# Patient Record
Sex: Male | Born: 1935
Health system: Southern US, Community
[De-identification: ages and names within clinical notes are randomized; demographics above are authoritative.]

## PROBLEM LIST (undated history)

## (undated) DIAGNOSIS — K219 Gastro-esophageal reflux disease without esophagitis: Secondary | ICD-10-CM

## (undated) DIAGNOSIS — M869 Osteomyelitis, unspecified: Secondary | ICD-10-CM

## (undated) DIAGNOSIS — I10 Essential (primary) hypertension: Secondary | ICD-10-CM

## (undated) DIAGNOSIS — N289 Disorder of kidney and ureter, unspecified: Secondary | ICD-10-CM

## (undated) HISTORY — PX: LEG AMPUTATION BELOW KNEE: SHX694

---

## 2004-01-20 ENCOUNTER — Other Ambulatory Visit: Admission: RE | Admit: 2004-01-20 | Discharge: 2004-01-20 | Payer: Self-pay | Admitting: Internal Medicine

## 2005-06-11 ENCOUNTER — Ambulatory Visit: Payer: Self-pay | Admitting: Physical Medicine & Rehabilitation

## 2005-06-11 ENCOUNTER — Inpatient Hospital Stay (HOSPITAL_COMMUNITY): Admission: EM | Admit: 2005-06-11 | Discharge: 2005-06-21 | Payer: Self-pay | Admitting: Emergency Medicine

## 2005-06-16 ENCOUNTER — Ambulatory Visit: Payer: Self-pay | Admitting: Plastic Surgery

## 2005-06-17 ENCOUNTER — Encounter (INDEPENDENT_AMBULATORY_CARE_PROVIDER_SITE_OTHER): Payer: Self-pay | Admitting: Specialist

## 2005-06-21 ENCOUNTER — Inpatient Hospital Stay (HOSPITAL_COMMUNITY)
Admission: RE | Admit: 2005-06-21 | Discharge: 2005-07-06 | Payer: Self-pay | Admitting: Physical Medicine & Rehabilitation

## 2005-08-10 ENCOUNTER — Ambulatory Visit: Payer: Self-pay | Admitting: Physical Medicine & Rehabilitation

## 2005-08-10 ENCOUNTER — Encounter
Admission: RE | Admit: 2005-08-10 | Discharge: 2005-11-08 | Payer: Self-pay | Admitting: Physical Medicine & Rehabilitation

## 2005-08-11 ENCOUNTER — Encounter
Admission: RE | Admit: 2005-08-11 | Discharge: 2005-11-09 | Payer: Self-pay | Admitting: Physical Medicine & Rehabilitation

## 2005-11-10 ENCOUNTER — Encounter
Admission: RE | Admit: 2005-11-10 | Discharge: 2005-12-09 | Payer: Self-pay | Admitting: Physical Medicine & Rehabilitation

## 2005-12-14 ENCOUNTER — Encounter (HOSPITAL_COMMUNITY): Admission: RE | Admit: 2005-12-14 | Discharge: 2006-01-13 | Payer: Self-pay | Admitting: Internal Medicine

## 2005-12-28 ENCOUNTER — Encounter
Admission: RE | Admit: 2005-12-28 | Discharge: 2006-03-28 | Payer: Self-pay | Admitting: Physical Medicine & Rehabilitation

## 2005-12-28 ENCOUNTER — Ambulatory Visit: Payer: Self-pay | Admitting: Physical Medicine & Rehabilitation

## 2005-12-29 ENCOUNTER — Encounter
Admission: RE | Admit: 2005-12-29 | Discharge: 2006-03-29 | Payer: Self-pay | Admitting: Physical Medicine & Rehabilitation

## 2005-12-31 ENCOUNTER — Ambulatory Visit (HOSPITAL_COMMUNITY): Admission: RE | Admit: 2005-12-31 | Discharge: 2005-12-31 | Payer: Self-pay | Admitting: Ophthalmology

## 2006-02-28 ENCOUNTER — Ambulatory Visit (HOSPITAL_COMMUNITY): Admission: RE | Admit: 2006-02-28 | Discharge: 2006-02-28 | Payer: Self-pay | Admitting: Ophthalmology

## 2006-03-14 ENCOUNTER — Ambulatory Visit (HOSPITAL_COMMUNITY): Admission: RE | Admit: 2006-03-14 | Discharge: 2006-03-14 | Payer: Self-pay | Admitting: Ophthalmology

## 2006-12-27 IMAGING — CR DG CHEST 2V
3 series · 3 of 3 positions shown · non-contrast
Comparison: 06/11/05.

CLINICAL DATA: Preop chest radiograph for ankle fracture.
 CHEST - 2 VIEW:

[view not recorded (1 of 3)]
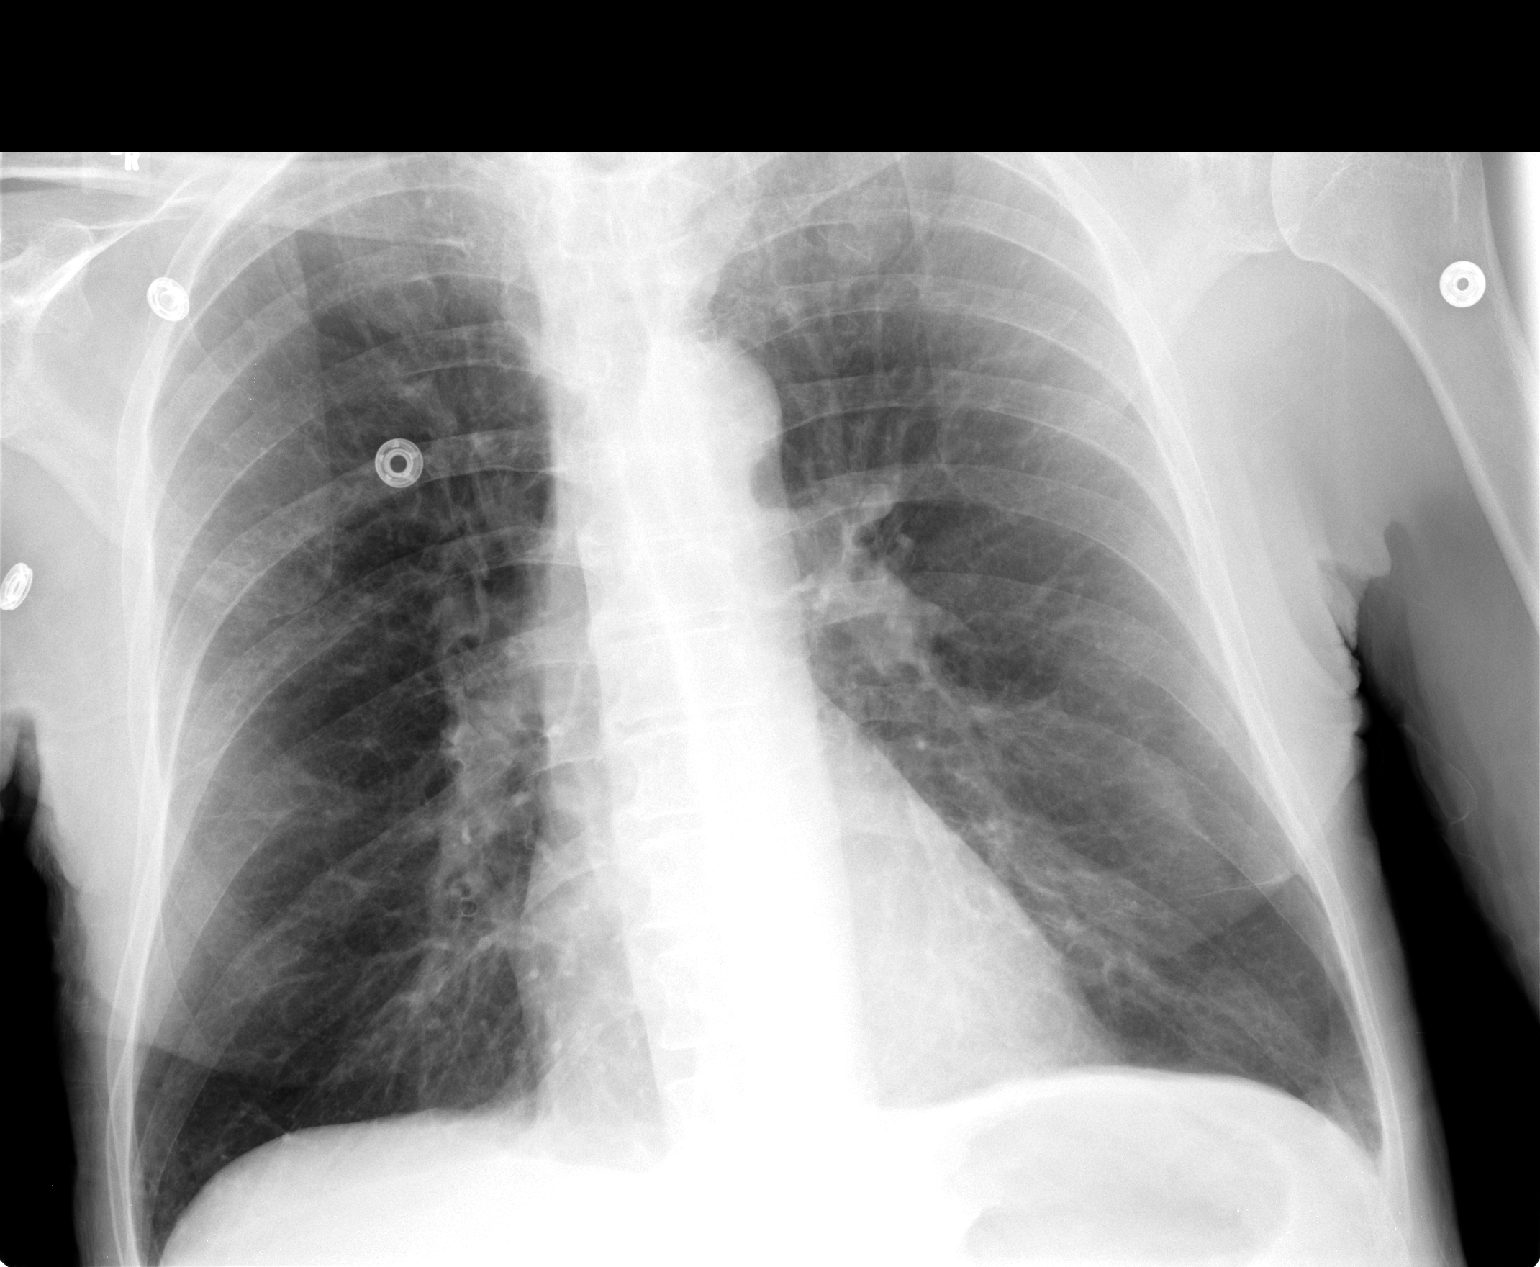

[view not recorded (2 of 3)]
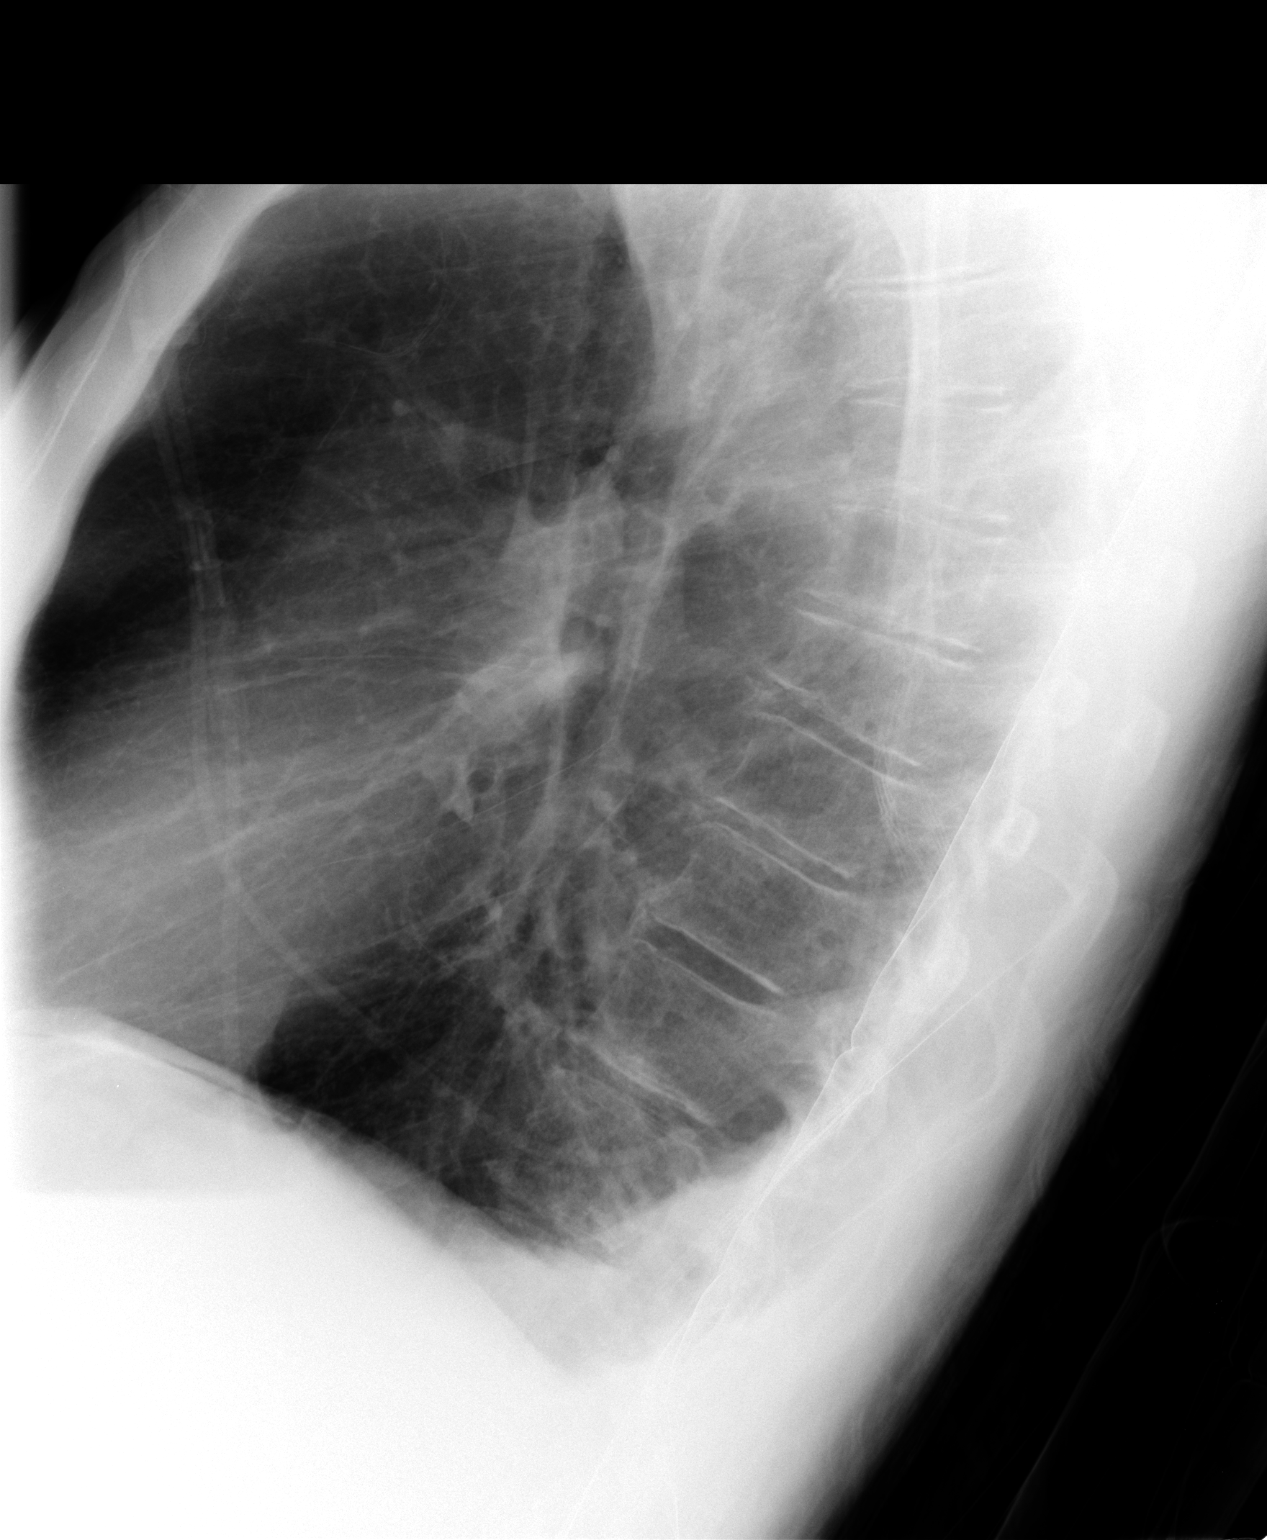

[view not recorded (3 of 3)]
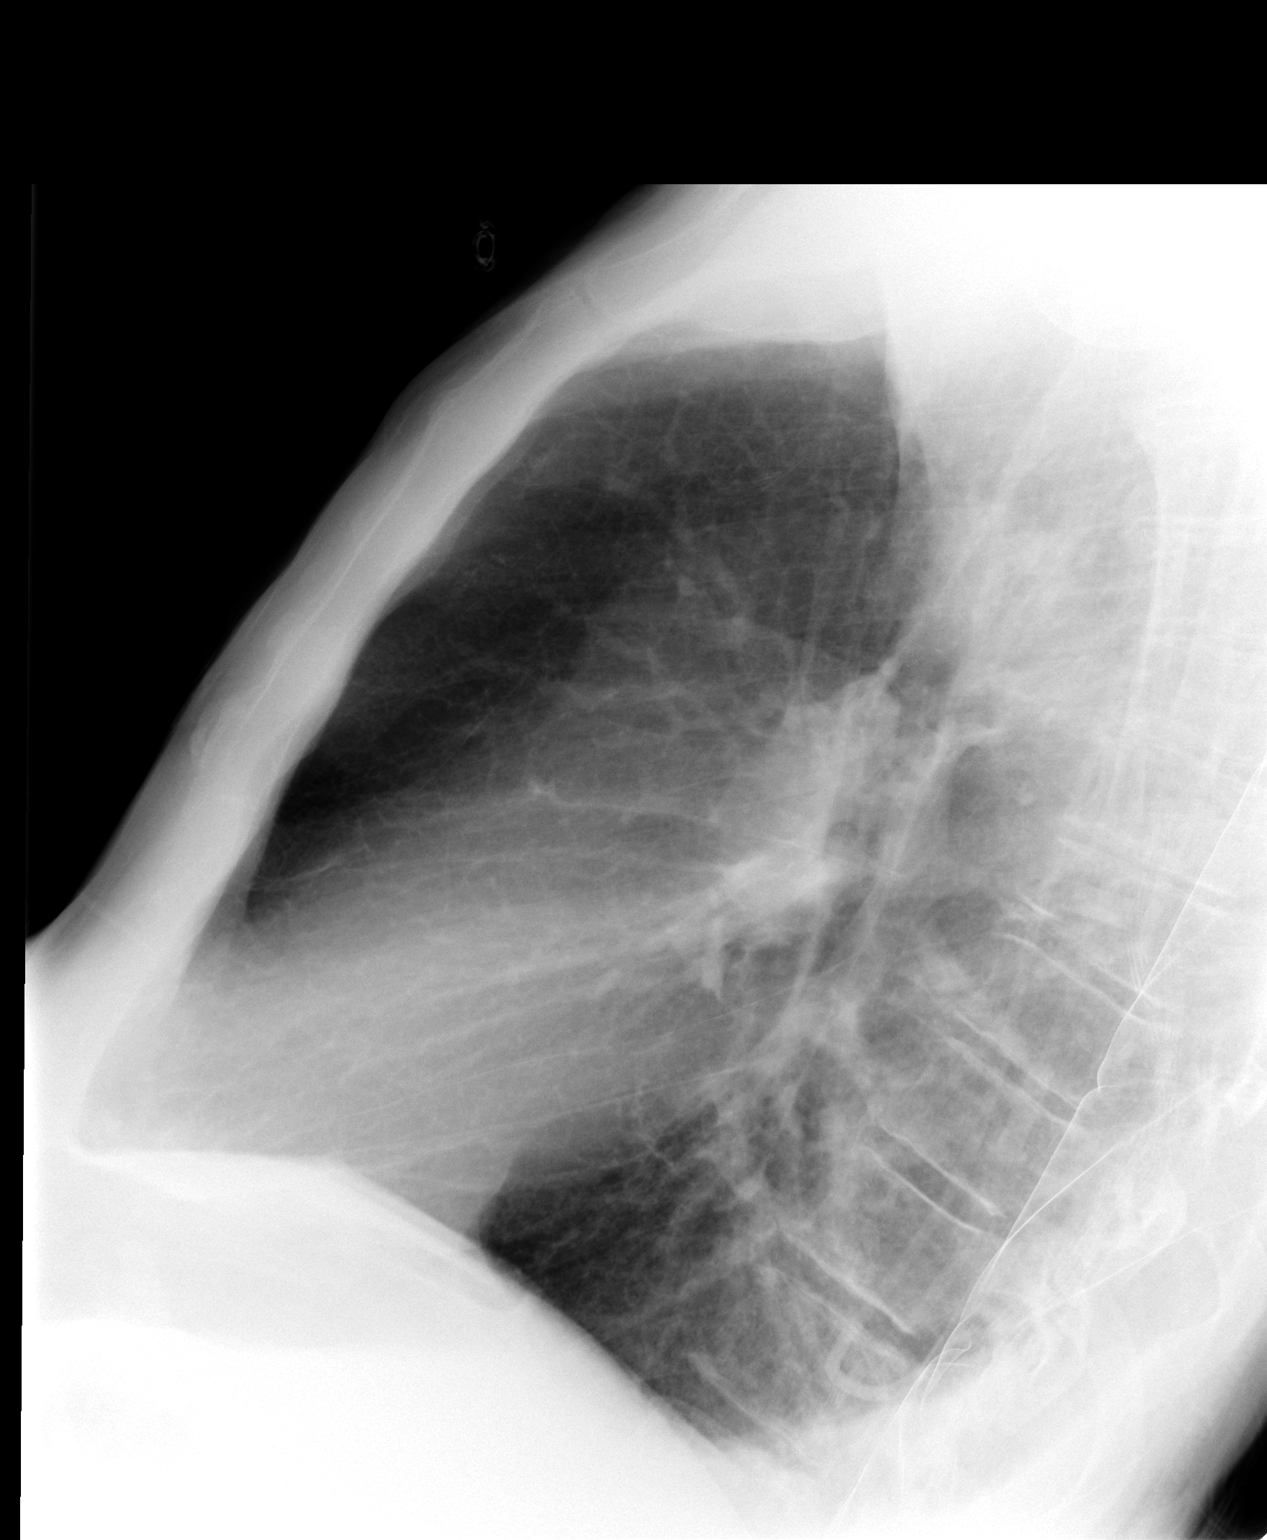

[3 of 3 positions shown; findings below may reference images not displayed]

FINDINGS: COPD/emphysema unchanged.  Heart size is normal.  There is a moderate-sized left effusion, best appreciated on the lateral radiograph.  There is no significant interstitial edema.
IMPRESSION: 1.  COPD/emphysema.
 2.  Left effusion.

## 2006-12-31 IMAGING — RF DG KNEE 1-2V*R*
1 series · 1 of 1 positions shown · non-contrast
Comparison: None.

CLINICAL DATA: Right below the knee amputation. 
 RIGHT KNEE - 2 VIEW ? 06/17/05:

[Series 1: run · 1 of 1 slices shown]
[im 1/1]
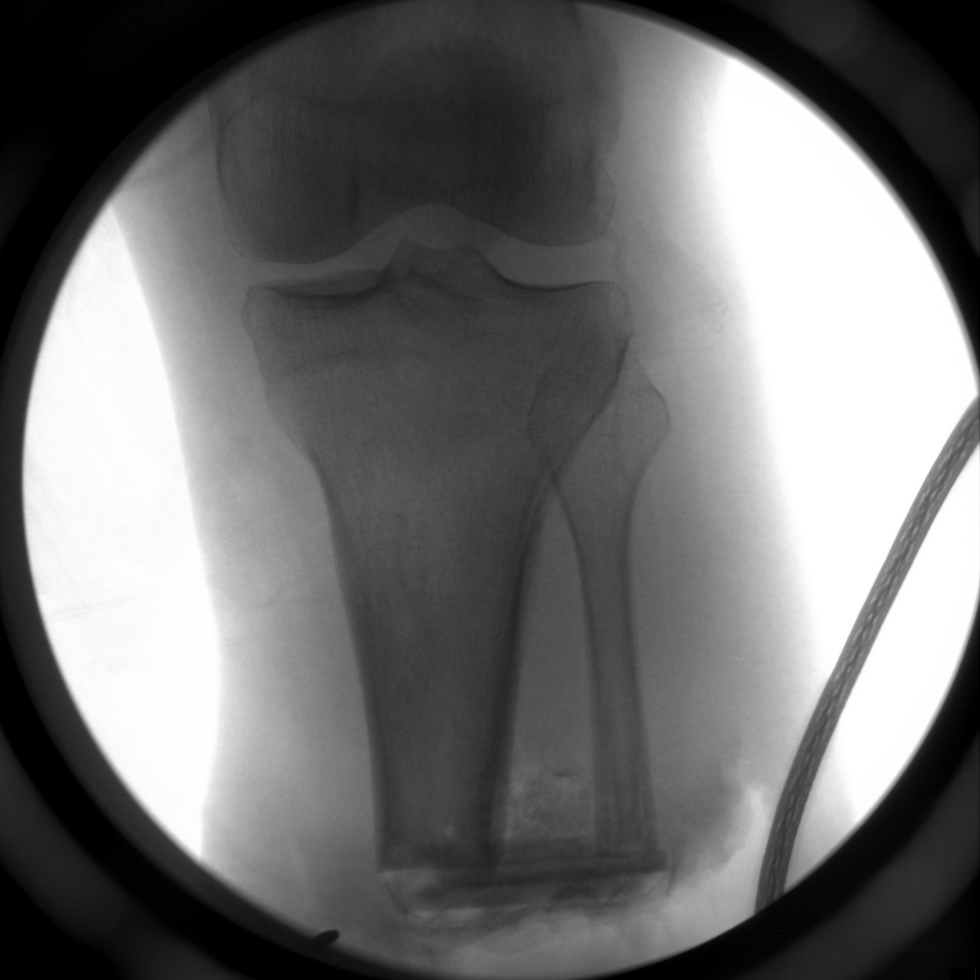

[1 of 1 positions shown; findings below may reference images not displayed]

FINDINGS: Single frontal spot floor image of the right knee is obtained during below the knee amputation.  Osteotomy is noted in the proximal tibial and fibular diaphyses with an overlying soft tissue defect.    No lateral film is required by the attending surgeon.
IMPRESSION: Intraoperative evaluation during below the knee amputation.

## 2007-10-12 ENCOUNTER — Ambulatory Visit (HOSPITAL_COMMUNITY): Admission: RE | Admit: 2007-10-12 | Discharge: 2007-10-12 | Payer: Self-pay | Admitting: Internal Medicine

## 2010-09-11 NOTE — Consult Note (Signed)
NAME:  Riley Griffith, Riley Griffith               ACCOUNT NO.:  [id number]   MEDICAL RECORD NO.:  [id number]          PATIENT TYPE:  INP   LOCATION:  1831                         FACILITY:  [place]   PHYSICIAN:  Toby L. Fugate, D.O.   DATE OF BIRTH:  1923-10-30   DATE OF CONSULTATION:  06/11/2005  DATE OF DISCHARGE:                                   CONSULTATION   REQUESTING PHYSICIAN:  Dr. Alvan Dame.   PRIMARY CARE PHYSICIAN:  None.   REASON FOR CONSULTATION:  Medical management of hyperglycemia.   HISTORY OF PRESENT ILLNESS:  Mr. 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  He sustained a fracture  dislocation of his right ankle joint.  The patient had to crawl several  yards and then drive himself back to the house.  He was then brought to  [facility] Emergency Room, where he was evaluated by Orthopedic Surgery.  The patient is scheduled for open reduction and internal fixation.  However,  he was found to have hyperglycemia with a serum glucose of 525.  The patient  is not known to have diabetes.  In fact, he has not seen a doctor since  childhood per family.  An insulin drip was initiated in the ED.  The patient  was also found to have sinus tachycardia with frequent PVCs.  This was felt  to be likely due to the patient's pain.  His pain is now well-controlled.  Currently, he has no complaints.   PAST MEDICAL HISTORY/PAST SURGICAL HISTORY:  None.   MEDICATIONS:  Aspirin p.o. daily.   ALLERGIES:  No known drug allergies.   SOCIAL HISTORY:  The patient denies tobacco, alcohol and IV drug abuse.  He  is a Psychologist, sport and exercise by trade.  He is married now for over 47 years.   FAMILY HISTORY:  Mother died at age 47 due to CHF.  His father died at age  52 due also to CHF.  The patient does have 1 aunt with diabetes.   REVIEW OF SYSTEMS:  A complete 12-point review of systems was obtained; the  review was negative, except for that stated in the HPI.   PHYSICAL EXAMINATION:  VITAL SIGNS:  Temperature 97.4, blood pressure  161/74, pulse 88, respiratory rate 16.  HEENT:  Pupils equally round and reactive to light.  Extraocular muscles  intact.  No scleral icterus.  Oropharynx clear/moist.  No erythema or  thrush.  NECK:  No JVD.  No carotid bruit.  No adenopathy.  HEART:  Regular rate and rhythm.  No murmurs, rubs, or gallops.  LUNGS:  Clear to auscultation bilaterally.  No wheezes, rales are rhonchi.  ABDOMEN:  Positive bowel sounds.  Nontender and non-distended.  EXTREMITIES:  No edema, clubbing or cyanosis.  The right lower extremity is  braced.  NEUROLOGICAL:  Cranial nerves II-XII intact.  There are no focal deficits.  DTRs are 2/4 in all extremities.  Strength is 5/5 in his extremities.   LABORATORY DATA:  White blood cell count is 10, hemoglobin 12.3, hematocrit  35.3, platelets  281,000.  Sodium 132, potassium 5.0, chloride 101, BUN 17,  glucose 541, creatinine 1.1, calcium 8.1, total protein 6.0, albumin 3.1,  AST 14, ALT 16, alkaline phosphatase 83, total bilirubin 0.7.  Troponin  negative x1.  PT 14.4, INR 1.1.  UA was significant for greater than 1000  mg/dl of glucose.   RADIOLOGIC FINDINGS:  Ankle x-rays showed fracture dislocation of the ankle  joint.   Chest x-ray shows basilar scarring, no active disease.   ACCESSORY CLINICAL DATA:  EKG shows sinus tachycardia with frequent PVCs.   ASSESSMENT AND PLAN:  1.  Hyperglycemia.  As stated above, this patient is not known to be      diabetic.  I will continue the patient on Glucomander for now.  I will      monitor Accu-Cheks per protocol.  I will check a hemoglobin A1c.  2.  Anemia.  The etiology of this is not clear.  I will check an iron panel      and reticulocyte count.  I will also check stool Hemoccult x3.  3.  Mild hyponatremia.  I will continue the patient on normal saline at 100      mL/hr.  4.  Sinus tachycardia with frequent premature ventricular contractions;  this      is likely due to pain.  The patient's heart rate is now within the      normal range.  5.  Right ankle fracture.  The patient is scheduled to undergo open      reduction and internal fixation.  6.  This patient is a full code.      Toby L. Fugate, D.O.  Electronically Signed     TLF/MEDQ  D:  06/11/2005  T:  06/12/2005  Job:  [id number]

## 2010-09-11 NOTE — Assessment & Plan Note (Signed)
MEDICAL RECORD NUMBER:  [id number]   DATE OF BIRTH:  Feb 27, 1924   REASON FOR EVALUATION:  Riley Griffith returns to clinic today accompanied by  his wife.  He is a 77 year old adult male who was admitted June 11, 2005  after a fall sustaining a severely comminuted open right ankle fracture.  He  underwent irrigation and debridement and was placed in a short leg cast,  June 11, 2005.  He then underwent open reduction and internal fixation  and had a wound V.A.C. placed June 13, 2005 by Dr. Alvan Dame.  The patient  was on Ancef and gentamicin along with penicillin to cover gram-positive and  -negative bacteriemia.  Followup with Plastic Surgery with Dr. Dessie Coma  showed increased swelling and erythema of the right lower extremity with  poor healing.  The patient had failed conservative treatment and  subsequently underwent a right below-knee amputation, June 17, 2005, by  Dr. Marcelino Scot.  His actual procedure was an Ertl procedure involving fusion of  the distal tibial to fibula.   To was on the rehabilitation unit between June 21, 2005 and July 06, 2005.   Since discharge, the patient has been at his home receiving home health  therapies.  He is able to walk approximately 50 feet with min-assist at the  present time.  His wife reports that his blood sugars have occasionally been  300, but usually up in the 200s.  He has followed up with Dr. Marcelino Scot and no  recommendations were made.  He has followed up with his primary care  physician and his Lantus insulin was actually cut to once a day.  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  He has not  been out to see them recently, as he has been mostly wheelchair-dependent.   MEDICATIONS:  1.  Lantus insulin 35 units nightly.  2.  Hydrochlorothiazide 25 mg daily.  3.  Flonase 0.4 mg nightly.  4.  Aspirin 81 mg daily.   PHYSICAL EXAMINATION:  GENERAL:  A well-appearing, thin, alert adult male.  He is  seated in a manual wheelchair.  VITAL SIGNS:  Blood pressure 121/58 with a pulse of 81, respiratory rate 16  and O2 saturation 95% on room air.  NEUROMUSCULAR:  He is able to stand with contact guard assistance and walks  with minimal assist using a standard walker.  EXTREMITIES:  Examination of his left lower extremity showed some well-  healed scars over the anterior lower leg.  He did have some slight fungus  growth on the dorsal aspect of his great toe.  He has some fungal growth  beneath the toenails on the left side.  There is no significant skin  breakdown on the left lower extremity.  Examination of the right lower  extremity showed scabs to be present over the suture line; those were  removed and he still has some healing to take place.  He has 5/5 strength  throughout the bilateral lower extremities and 5/5 strength throughout the  bilateral upper extremities.  Range of motion was within function limits in  the bilateral upper and lower extremities with only slight hamstring  tightness noted.   ASSESSMENT:  1.  Status post below-knee amputation, June 17, 2005, secondary to the      severely comminuted right ankle fracture and new diagnosis of diabetes      mellitus.  2.  Insulin-dependent diabetes mellitus.  3.  Mild hypertension.  PLAN:  In the office today, we did set up an appointment for the patient to  be seen by Staci Righter at [facility] for eventual casting for his prosthesis.  He  will be fitted with a K-3 level prosthesis with a Flex-Foot, split toe and  split heel, along with an __________  liner and shuttle pin cock.  We will  plan on seeing the patient in followup in approximately 4-6 weeks, once he  has his definitive prosthesis.  He needs another couple of weeks to heal up  the suture line on the right side.  He has minimal to no edema in the right  lower extremity.  We have cautioned him about his diabetes control and the  fact that he needs to keep a close  eye on his left leg to prevent any  further amputations on that side.   We will plan on seeing the patient in followup in this office once he his  definitive prosthesis in approximately 6 weeks' time.           ______________________________  Riley Griffith, M.D.     DC/MedQ  D:  08/11/2005 14:30:44  T:  08/12/2005 10:33:15  Job #:  [id number]

## 2010-09-11 NOTE — Consult Note (Signed)
NAME:  TAVEION, BAADE NO.:  [id number]   MEDICAL RECORD NO.:  [id number]          PATIENT TYPE:  INP   LOCATION:  5025                         FACILITY:  [place]   PHYSICIAN:  Astrid Divine. Marcelino Scot, M.D. DATE OF BIRTH:  November 17, 1923   DATE OF CONSULTATION:  06/14/2005  DATE OF DISCHARGE:                                   CONSULTATION   REQUESTING PHYSICIAN:  Pietro Cassis. Alvan Dame, M.D.   REASON FOR CONSULTATION:  Request grade IIIB open right tibia fracture,  evaluate for possible further treatment recommendations.   BRIEF SUMMARY OF PRESENTATION AND HOSPITAL COURSE:  Mr. Ferrare is a 77-year-  old male who sustained an open right ankle fracture/dislocation in a cow  pasture. He was able to drive home and with his wife came to the emergency  room. He underwent I&D of his open ankle fracture and then was taken back to  the OR at which time he underwent repeat irrigation and debridement with  application of a wound vac. Medicine was consulted for hyperglycemia and he  has been on insulin with elevated sugars but they are trending down. At the  time evaluation, Mr. Herwick complains of only mild pain in his right ankle  and otherwise reports no complaints.   PAST MEDICAL AND SURGICAL HISTORY:  Unremarkable. He has received no medical  attention since being in the [employer].   MEDICATIONS:  An aspirin daily.   ALLERGIES:  None.   SOCIAL HISTORY:  The patient does not smoke nor drink alcohol. The patient  is also a Psychologist, sport and exercise.   PHYSICAL EXAMINATION:  Mr. Sabine is alert and oriented at this time. His  right lower extremity is notable for some cracks in the skin, some erythema  localized around the area of the open fracture. There is decreased sensation  over all but active flexion/extension of the greater and lesser toes. There  is some mild abrasions about the right knee. Examination of the  contralateral left lower extremity is notable for large that dorsal ulcer  over the left  great toe. There is dried skin and callus which would require  debridement at some point.   X-rays demonstrate comminuted fibular fracture status post ORIF as well as  medial malleolus fracture status post internal fixation with septal  reduction and alignment.   ASSESSMENT:  He is grade IIIB open barnyard fracture-dislocation status post  open reduction/internal fixation with large medial defect and poorly  controlled blood sugar.   FURTHER MANAGEMENT:  I have discussed with both Mr. Martelli and Dr. Alvan Dame the  likelihood that this medial wound would develop into an unreconstructable  soft tissue defect. He is not a candidate for a flap given his age and  diabetes as well as associated neuropathy. I will plan to return tomorrow to  evaluate with would care at the time of vac removal to better assess the  possibility of a split-thickness skin grafting following a prolonged period  of wound vac treatment should his periosteal layer be intact and he have  adequate soft tissues.      Astrid Divine. Marcelino Scot, M.D.  Electronically Signed     MHH/MEDQ  D:  06/15/2005  T:  06/16/2005  Job:  [id number]

## 2010-09-11 NOTE — Assessment & Plan Note (Signed)
Riley Griffith returns to the clinic today accompanied by his wife.  The  patient is a 77 year old adult male with new onset of insulin dependent  diabetes mellitus.  That was discovered after he had a fall in February 2007  and suffered a severely comminuted right ankle fracture that went on to have  poor healing and he developed an infection at that site.  He subsequently  underwent right below the knee amputation with ERTL procedure involving  fusion of the distal tibia to the fibula done June 17, 2005, performed  by Dr. Marcelino Scot.   The patient was last seen in this office August 11, 2005.  He obtained his  prosthesis in approximately late April 2007 and has finished with Robin in  the outpatient clinic approximately three weeks ago.  He is now attending  outpatient therapy at [facility] for balance improvement.   The patient reports he is having no problem with his prosthesis.  He can  walk inside the home without a cane and also walks outside on level surfaces  without a cane.  He prefers to use the cane for long distance mobility or on  unlevel surfaces.   His blood sugar has been in the 73 to 108 range in the morning and 155 to  281 in the afternoon.  Those are managed by Dr. Nevada Crane, his primary care  physician.  He is due to follow up with Dr. Nevada Crane January 06, 2006.  The  patient is due for an appointment with [name] at [place] this coming Tuesday,  January 04, 2006, for fitting of the sleeve over his prosthesis.  He has  had no problem whatsoever.  He does have a small blood blister that has  broken on the medial aspect of his left foot.  His wife keeps close contact  on that.   MEDICATIONS:  1. Lantus insulin 35 units nightly.  2. Hydrochlorothiazide 25 mg daily.  3. Flonase 0.4 mg nightly.  4. Aspirin 81 mg daily.   REVIEW OF SYSTEMS:  Noncontributory.   PHYSICAL EXAMINATION:  Well appearing fit adult male in mild to no acute  discomfort.  Blood pressure 191/90  with a pulse 61, respiratory rate 18, O2  saturation 97% on room air.  He has 5-/5 strength in both the bilateral  upper and lower extremities.  Bulk and tone were normal.  Reflexes were 2+  and symmetrical.  Examination of his left medial heel shows an approximately  6-7 mm blood blister which has broken and is in the process of healing.  There are no signs or symptoms of infection at that site.  Examination of  his below the knee amputation site on the right side shows excellent healing  with no skin breakdown.   IMPRESSION:  1. Status post below knee amputation on the right after a severely      comminuted right ankle fracture and new diagnosis of insulin dependent      diabetes mellitus.  2. Hypertension.   In the office today, we did release the patient to follow up on an as needed  basis.  He has made excellent progress overall with his mobility.  He will  be seeing [name] at [facility] for fitting of his sleeve over the distal portion  of his  prosthesis for anesthetic affect.  We will plan on seeing the patient in  follow up in this office on an as needed basis with follow up of his primary  care physician scheduled  for January 06, 2006.           ______________________________  Jarvis Morgan, M.D.     DC/MedQ  D:  12/29/2005 12:38:17  T:  12/29/2005 17:56:13  Job #:  [id number]

## 2010-09-11 NOTE — Op Note (Signed)
NAME:  Riley Griffith, MORRICE NO.:  [id number]   MEDICAL RECORD NO.:  [id number]          PATIENT TYPE:  INP   LOCATION:  [id number]                         FACILITY:  [facility]   PHYSICIAN:  Pietro Cassis. Alvan Dame, M.D.  DATE OF BIRTH:  25-Oct-1923   DATE OF PROCEDURE:  06/11/2005  DATE OF DISCHARGE:                                 OPERATIVE REPORT   PREOPERATIVE DIAGNOSIS:  Grade IIIB open right ankle fracture.   POSTOPERATIVE DIAGNOSIS/FINDINGS:  The patient was noted to have a grade 2  right open ankle fracture with an L-type laceration to the skin with a 5 cm  proximal limb and  a 4 cm transverse limb.  There was exposed bone and  cortical fragments that were extremely dirty.  The patient was noted to have  a 5 mm  chondral defect to the medial dome of the talus most likely  impaction.  Cartilage fragment was absent from the wound.   PROCEDURE:  Incision and drainage of right lower leg wound including skin,  subcutaneous tissue, periosteum, and bone fragments.  Closed reduction and  application of a short leg splint.   SURGEON:  Pietro Cassis. Alvan Dame, M.D.   ASSISTANT:  Abbott Pao. [name], P.A.-C.   ANESTHESIA:  General.   ESTIMATED BLOOD LOSS:  Minimal.   COMPLICATIONS:  None.   INDICATIONS FOR PROCEDURE:  Mr. 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  He had an obvious injury and was unable to bear weight, he had an  open fracture.  He dragged himself to his car and then subsequently drove to  his wife who brought him to the emergency room  where initial evaluation was  carried out by the ER physicians.  We were consulted.  At the time of  consultation, we asked him to check tetanus vaccination, Ancef, gentamicin  and  penicillin were given.  His wound was reduced and doused in Betadine  and dressed and put into a posterior splint.  Upon review of the x-rays, I  carried out a discussion with Mr.  Moskos, his wife, and daughter-in-law,  about the serious nature of this fracture and the injury and the significant  risks for infection and ultimate amputation.  Upon review of this discussion  and encouraging questions and statements, patient consent was obtained.   PROCEDURE IN DETAIL:  The patient was brought to the operating theater.  Once adequate anesthesia and perioperative antibiotics as noted were  administered, the patient was positioned supine.  A 1015 dressing was  applied.  Initial cleansing of the leg was carried out with normal saline  solution to debride gross debris.  The right lower extremity was then  prepped and draped in a sterile fashion from the toes to the 1015 above the  knee.  Following dressing and prepping of the leg, the skin edges were  incised to remove skin, subcutaneous debridement was carried out followed by  removal of periosteum and bone fragments.  Following this gross debridement,  the wound  was irrigated with 6 liters of normal saline solution followed by  500 mL of Bacitracin laden normal saline.  This was done with pulse lavage.  Once this was carried out, radiographs were obtained in order to reduce the  fragment.  I did, then, try to reapproximate the skin edges in an effort to  reapproximate.  Once the wound was reapproximated, the wound was cleaned,  dressed sterilely with Xeroform, dressing sponges, bulky dressing, and  placed in a posterior splint.  The patient was then extubated and brought to  the recovery room in stable condition.   He will be admitted to the hospital for IV antibiotics as noted.  He will be  evaluated by medical personnel for his diabetes and placed on sliding scale.  The plan will also include for him to return to the operating room on Sunday  for repeat I&D.      Pietro Cassis Alvan Dame, M.D.  Electronically Signed     MDO/MEDQ  D:  06/11/2005  T:  06/12/2005  Job:  [id number]

## 2010-09-11 NOTE — Procedures (Signed)
NAME:  Riley Griffith, Riley Griffith               ACCOUNT NO.:  [id number]   MEDICAL RECORD NO.:  [id number]          PATIENT TYPE:  AMB   LOCATION:  DAY                           FACILITY:  [facility]   PHYSICIAN:  Edward L. Luan Pulling, M.D.DATE OF BIRTH:  [birth year]   DATE OF PROCEDURE:  01/05/2006  DATE OF DISCHARGE:                                EKG INTERPRETATION   RESULTS:  The rhythm is sinus rhythm.  There are areas that look like a  trigeminal rhythm.  There is first-degree AV block.  There appears to be  chronic pulmonary disease pattern.  Poor R-wave progression across the  precordium may indicate a previous anterior myocardial infarction and  clinical correlation is suggested.      Edward L. Luan Pulling, M.D.  Electronically Signed     ELH/MEDQ  D:  01/05/2006  T:  01/06/2006  Job:  [id number]

## 2010-09-11 NOTE — Consult Note (Signed)
NAME:  Riley Griffith, Riley Griffith NO.:  [id number]   MEDICAL RECORD NO.:  [id number]          PATIENT TYPE:  INP   LOCATION:  5025                         FACILITY:  [place]   PHYSICIAN:  Aretha Parrot., M.D.DATE OF BIRTH:  22-Feb-1924   DATE OF CONSULTATION:  06/16/2005  DATE OF DISCHARGE:                                   CONSULTATION   I was asked to see this 77 year old male by Dr. Marcelino Scot.  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  He was noted in  the emergency room to have a blood sugar of 525 mg%, which was a new  diagnosis of diabetes mellitus for him.  He has not seen a physician for an  extended period of time.  He was taken to the operating room on the date of  admission with an initial incision and drainage, irrigation of the wound and  closed reduction and application of leg splint.  He was returned to the  operating room on June 13, 2005, for an open reduction and internal  fixation and cleansing of the wound in the operating room.   PHYSICAL EXAMINATION:  A swollen, erythematous right lower extremity up to  the midcalf area.  There is a large area of skin loss overlying his medial  malleolus with exposed distal medial tibia.   IMPRESSION:  Severe fracture-dislocation of right lower extremity, which  apparently was a compound, comminuted distal tibial fracture (operative note  by Dr. Alvan Dame notes this, although the x-ray report is not currently available  to me).  He has a resultant fracture site of the medial malleolus with  overlying skin loss secondary to the injury, and he has a recent diagnosis  also of diabetes mellitus.   I discussed with the patient and his wife his situation.  Apparently they  have made a decision based upon the options available to them of proceeding  with a below-knee amputation.  If any attempt at bone cover was to be  made,  it would probably require a free tissue transfer, which I think would be  questionable in his age group and with his recent diagnosis of diabetes  mellitus, or at least would be fraught with increased risk.  It would also  have to follow an extended period of observation of the healing of the wound  and continuing treatment with aggressive antibiotics and trying to get over  this recent acute injury prior to doing anything in terms of an aggressive  coverage of this.  However, I would agree with the plans for the below-knee  amputation, which I think is reasonable in light of overall circumstances  and something that the patient and his wife have decided would be best for  these circumstances.      Aretha Parrot., M.D.  Electronically Signed     HH/MEDQ  D:  06/16/2005  T:  06/17/2005  Job:  [id number]

## 2010-09-11 NOTE — H&P (Signed)
NAME:  Riley Griffith, Riley Griffith NO.:  [id number]   MEDICAL RECORD NO.:  [id number]          PATIENT TYPE:  IPS   LOCATION:  4002                         FACILITY:  [facility]   PHYSICIAN:  Jarvis Morgan, M.D.   DATE OF BIRTH:  04-19-1924   DATE OF ADMISSION:  06/21/2005  DATE OF DISCHARGE:                                HISTORY & PHYSICAL   TRAUMA PHYSICIAN:  Dr. Marcelino Scot   ORTHOPEDIST:  Dr. Alvan Dame   TREATING PHYSICIAN:  Dr. Jones Bales   HISTORY OF PRESENT ILLNESS:  Mr. Seppala is a 77 year old Caucasian male  admitted June 11, 2005 after a fall without loss of consciousness.  He  suffered a comminuted open right ankle fracture and underwent incision and  drainage along with placement of a short leg cast June 11, 2005.  Patient then underwent open reduction internal fixation along with wound VAC  treatment June 13, 2005 by Dr. Alvan Dame.  He was placed on Ancef and  gentamicin along with penicillin to cover gram-positive and gram-negative  bacteremia.   Follow-up evaluation by Dr. Dessie Coma from plastics showed increased  swelling along with erythema of the right lower extremity with poor healing.  It was felt that the patient would not be able to keep his leg secondary to  the infection.   The patient underwent a right below-knee amputation June 17, 2005 by Dr.  Marcelino Scot.  His hospital course has been remarkable for elevated blood sugars  with a new diagnosis of diabetes mellitus.  His recent blood sugars have  been slightly greater than 200.  He has been followed by Dr. Jones Bales for  medical management.  Skin care team was consulted June 21, 2005 for left  foot great toe lesion and they ordered an extra depth shoe from [employer].  He  was placed on Coumadin per Dr. Marcelino Scot for DVT prophylaxis after his below-  knee amputation.   The patient was evaluated by the rehabilitation physicians and felt to be an  appropriate candidate for inpatient rehabilitation.   REVIEW  OF SYSTEMS:  Noncontributory.   PAST MEDICAL HISTORY:  Noncontributory (has not seen physicians lately).   FAMILY HISTORY:  Positive for coronary artery disease and diabetes mellitus.   SOCIAL HISTORY:  The patient lives with his wife.  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  His wife can assist as needed.  They live  in a one-level home with three steps to enter.  The patient does not use  alcohol or tobacco.   FUNCTIONAL HISTORY:  Prior to admission:  Independent.   MEDICATIONS PRIOR TO ADMISSION:  Aspirin 81 mg daily.   ALLERGIES:  No known drug allergies.   Recent laboratories showed a hemoglobin of 10, hematocrit 29, platelet count  483,000, white count of 11.6.  Recent INR was 1.9.  Recent sodium 135,  potassium 4.1, chloride 101, CO2 28, BUN 15, and creatinine 1.1.  Recent  CBGs have been 80, 145, and 176.   PHYSICAL EXAMINATION:  GENERAL:  Reasonably well-appearing, fit elderly male  lying in bed in no acute discomfort.  VITAL SIGNS:  Blood  pressure 146/79 with pulse of 60, respiratory rate 18,  and temperature 97.8.  HEENT:  Normocephalic, non-traumatic.  CARDIOVASCULAR:  Regular rate and rhythm.  S1, S2 without murmurs.  ABDOMEN:  Soft with positive bowel sounds.  LUNGS:  Clear to auscultation bilaterally.  NEUROLOGIC:  Alert and oriented x3.  Cranial nerves II-XII are intact.  Bilateral upper extremity examination showed 5-/5 strength throughout.  Bulk  and tone were normal and reflexes were 2+ and symmetrical.  Sensation was  intact to light touch throughout the bilateral upper extremities.   Lower extremity examination on the right side showed well healing amputation  site below-knee level with staples in place and minimal drainage on the dry  dressing.  Examination of the left lower extremity showed a small ulcer over  his left great toe.  Reflexes were 1+ in the left lower extremity and  sensation was slightly decreased to light touch over the distal  aspect of  his left foot.  Left lower extremity strength was 5/5.   IMPRESSION:  1.  Status post right below-knee amputation June 17, 2005 postoperative      day #4 for severely comminuted right ankle fracture with poor healing.  2.  New onset diabetes mellitus.  3.  Poor medical compliance with no primary care physician established.  4.  Left great toe lesion with wound care follow-up.   Presently, the patient has deficits in activities of daily living,  transfers, and ambulation secondary to his right below-knee amputation.   PLAN:  1.  Admit to the rehabilitation unit for daily OT and PT therapies to      advance ADLs, transfers, and ambulation as possible.  2.  24-hour nursing for medication administration, monitoring of vitals, and      wound dressing changes as needed.  3.  Continue Coumadin per pharmacy for DVT prophylaxis.  4.  Diabetic teaching in terms of CBGs along with administration of insulin.  5.  Continue Lantus 20 units subcutaneous q.h.s. with CBGs a.c. and h.s.  6.  Continue Robaxin 500 mg q.6h. p.r.n. for spasms along with Vicodin 5/325      one to two tablets p.o. q.4-6h. p.r.n. for pain management.  7.  Case manager to assist in finding a primary care physician for this      patient.  8.  [employer] consult for extra depth shoe for left great toe ulcer.  9.  Continue Nu-Iron 150 mg p.o. daily for postoperative anemia.  10. Continue wound dressing to the left foot per skin care nurse.  11. Continue dry dressing to the right below-knee amputation site and apply      an Ace wrap daily.   PROGNOSIS:  Good.   ESTIMATED LENGTH OF STAY:  5-12 days.   GOALS:  Modified independent, ADLs, transfers, and short distance ambulation  hopping on his left leg.           ______________________________  Jarvis Morgan, M.D.     DC/MEDQ  D:  06/21/2005  T:  06/21/2005  Job:  [id number]

## 2010-09-11 NOTE — Discharge Summary (Signed)
NAME:  TOUA, DROGE NO.:  [id number]   MEDICAL RECORD NO.:  [id number]          PATIENT TYPE:  INP   LOCATION:  5025                         FACILITY:  [facility]   PHYSICIAN:  Astrid Divine. Marcelino Scot, M.D. DATE OF BIRTH:  1924/02/28   DATE OF ADMISSION:  06/11/2005  DATE OF DISCHARGE:  06/21/2005                                 DISCHARGE SUMMARY   DISCHARGING PHYSICIAN:  Astrid Divine. Marcelino Scot, M.D.   ADMISSION DIAGNOSIS:  Grade 3B open right ankle fracture dislocation.   DISCHARGE DIAGNOSIS:  Right below knee amputation.   CONSULTANTS:  Erline Hau, M.D., General Medicine.   PROCEDURE PERFORMED:  1.  Irrigation and debridement.  2.  Open reduction and internal fixation with repeat irrigation and      debridement and application of Wound Vac.  3.  Right Ertl below knee amputation.   HOSPITAL COURSE:  Mr. Jirsa is a 77 year old male with no known prior  medical history who sustained an open fracture dislocation in [place].  He walked on the extremity back to his car and then was able to drive to the  house and proceed to the emergency department for further evaluation and  management.  At that time, he was found to have a serum glucose of 525.  It  was his first known diagnosis of diabetes.  He underwent irrigation and  debridement on the night of admission with a loose primary closure of a  large open transverse wound.  He was taken back to the operating room a  couple of days later for repeat irrigation and debridement with internal  fixation in a percutaneous manner to avoid exposure of the hardware within  the wound.  At that time, a Wound Vac was applied. Wound Care was consulted  for possible definitive management of this injury as well as for discussion  of the possibility of below knee amputation given his soft tissue problem in  a patient with diabetes and a large soft tissue defect.  Both myself and  Wound Care agree that this was not a  reconstructable soft tissue injury and  that he was not a candidate for a flap.  Furthermore, the bone was quite  desiccated exposing him to very high risk of longstanding infection.  The  general medical service continued to follow the patient closely for control  of his blood sugar.  Also of note, Dr. Harlow Mares from the Plastic Surgery  Service evaluated the patient and also rendered another opinion again  suggesting a below knee amputation as the most reasonable force.  I have  discussed this at length with Mr. Gills and his wife and also consulted  the local prosthetist who arranged for an amputee to discuss with Mr.  Para the lifestyle changes post surgery.  Following a complete discussion  of risks and benefits Mr. Garris wished to proceed with below knee  amputation.  This was performed without complication on 0000000.  Postoperatively, the patient remained quite comfortable and was able to be  transitioned off the PCA by postoperative day #2.  Rehabilitation and [facility]  were  consulted who felt the patient would benefit from a limited stay.  He  is put on Coumadin for DVT prophylaxis postoperatively and by about  postoperative day #2 or 3, his blood sugar was being consistently under 200.  During his hospital course he did receive transfusion for blood loss anemia.  At the time of discharge, he was tolerating a regular diet quite well and  had excellent pain control.  His incision was clean, dry and intact with no  drainage, no evidence of infection, and he was well versed in the need to  maintain knee extension.  Also of note on the contralateral left side he had  a dorsal ulcer at this great toe MTP joint that is going to be treated with  shoe modification.   DISCHARGE INSTRUCTIONS:  1.  Mr. Meddings is to maintain on weight bearing on the right lower      extremity and to maintain a compressive wrap on the stump.  2.  The dressing need not be changed for another two days  after discharge      given that it was completely dry.  3.  When he returns to the clinic in 7 days we will anticipate referring him      for a shrinker which we provided through [employer].  4.  Also, on the day of discharge, [employer] was consulted to obtain for him      an extra depth shoe or soft dorsal _______ to allow for healing of his      left great toe callous.  5.  He can take Percocet one or two tablets as needed for pain control      although again currently he is not taking any pain medications.  6.  He will be managed on Coumadin for DVT prophylaxis with an INR between 2      and 3.   DISCHARGE MEDICATIONS:  1.  NovoLog sliding scale insulin, 2 units for sugars running 100-150, 3      units for 150-200, 5 units 200-250, 7 units 250-300 and 9 units 300-350      with 11 units if greater than 350.  2.  Coumadin to be taken as directed.  3.  Lantus 20 units q.h.s.  4.  Iron 150 mg p.o. daily as long as tolerated.  May discontinue if causing      GI upset.  5.  Robaxin 500 mg p.o. q.6 h. p.r.n.  6.  Colace p.r.n.  7.  Senokot p.r.n.   FOLLOWUP:  The patient is to return for followup in nine days.  Please  contact the office with problems, concerns or questions at [phone].  The  patient will need a primary care physician with which he will also need  followup in the first week after discharge in the town of [place].  The  medicine consultants did state that they would assist with this referral.      Astrid Divine. Marcelino Scot, M.D.  Electronically Signed     MHH/MEDQ  D:  06/21/2005  T:  06/21/2005  Job:  [id number]   cc:   Toby L. Fugate, D.O.   Aretha Parrot., M.D.  Fax: [phone]   Pietro Cassis. Alvan Dame, M.D.  Fax: [phone]

## 2010-09-11 NOTE — Discharge Summary (Signed)
NAME:  Riley Griffith, Riley Griffith NO.:  [id number]   MEDICAL RECORD NO.:  [id number]          PATIENT TYPE:  IPS   LOCATION:  4002                         FACILITY:  [facility]   PHYSICIAN:  Jarvis Morgan, M.D.   DATE OF BIRTH:  1923/10/01   DATE OF ADMISSION:  06/21/2005  DATE OF DISCHARGE:  07/06/2005                                 DISCHARGE SUMMARY   DISCHARGE DIAGNOSES:  1.  Right below knee amputation June 17, 2005, secondary to severe      comminuted right ankle fracture.  2.  Pain management.  3.  Coumadin for deep venous thrombosis prophylaxis.  4.  Postoperative anemia.  5.  Urinary retention.  6.  Left great toe lesion.  7.  New onset diabetes mellitus.   HISTORY OF PRESENT ILLNESS:  A 77 year old white male admitted June 11, 2005, after a fall, sustaining a severe comminuted open right ankle  fracture.  Underwent irrigation and debridement with a short leg splint  June 11, 2005, then open reduction and internal fixation and wound VAC  June 13, 2005, per Dr. Alvan Dame.  Placed on Ancef, gentamicin, penicillin to  cover gram positive and gram negative bacteremia.  Follow-up plastic  surgery, Dr. Dessie Coma, for increased swelling and erythema of the right  lower extremity with poor healing.  Underwent right below knee amputation  June 17, 2005, per Dr. Marcelino Scot.  Hospital course complicated by elevated  blood sugars.  Follow-up medical management per Dr. Jones Bales, placed on Lantus  insulin.  Skin care team consulted June 21, 2005, for left great toe  lesion.  Order was placed for diabetic shoes.  Patient had been on Coumadin  for deep venous thrombosis prophylaxis since his below knee amputation  June 17, 2005.  He was admitted for comprehensive rehab program.   PAST MEDICAL HISTORY:  See discharge diagnoses.   No alcohol or tobacco.   ALLERGIES:  NONE.   SOCIAL HISTORY:  Lives with his wife and assistance as needed.  One level  home, three  steps to entry.   MEDICATION PRIOR TO ADMISSION:  Aspirin daily.   REHABILITATION HOSPITAL COURSE:  Patient was admitted to inpatient rehab  services with therapies initiated on a b.i.d. basis consisting of physical  therapy, occupational therapy, and rehabilitation nursing.  The following  issues were addressed during patient's rehabilitation stay.  Pertaining to  Mr. Mcvicker right below knee amputation June 17, 2005, surgical site  healing nicely.  No signs of infection.  He completed antibiotics for wound  coverage.  He would follow up in the amputee clinic.  Pain management  ongoing with the use of Vicodin and Robaxin with good results.  He remained  on Coumadin for deep venous thrombosis prophylaxis with latest INR of 2.8.  He would complete Coumadin protocol per [facility].  Resume his  aspirin therapy after Coumadin completed.  Postoperative anemia stable with  latest hemoglobin of 11.4, hematocrit 33.7.  He had been fitted with new  diabetic shoes for a left great toe lesion per [employer]. Wound was healing  nicely.  Findings of new  onset diabetes mellitus.  Blood sugars regulated.  He was now on Lantus insulin.  He received full diabetic teaching and would  follow up with his primary M.D., Dr. Delphina Cahill, of [place], [place].  Functionally, he was simple setup for upper body activities of  daily living and minimal assist lower body, ambulating minimal assist 50  feet with a rolling walker, minimal assist bed to chair transfers.  He was  discharged to home.   DISCHARGE MEDICATIONS:  At time of dictation include:  1.  Coumadin 5 mg daily to be completed on July 15, 2005.  2.  Lantus insulin 8 units in the a.m. and 22 units in the p.m.  3.  Urecholine 10 mg three times daily.  4.  Flomax 0.4 mg daily at bedtime.  5.  Hydrochlorothiazide 25 mg daily.  6.  Vicodin p.r.n. pain.  7.  Tylenol p.r.n.   ACTIVITY:  As tolerated.   DIET:  Diabetic diet.    WOUND CARE:  Follow-up with Dr. Marcelino Scot, orthopedic services.  Arrangements  made to see in amputee clinic.   It was noted patient would remain on Urecholine and Flomax for urinary  retention as he was now voiding better with smaller post void residuals.      Lauraine Rinne, P.A.    ______________________________  Jarvis Morgan, M.D.    DA/MEDQ  D:  07/05/2005  T:  07/06/2005  Job:  [id number]   cc:   Astrid Divine. Marcelino Scot, M.D.  Fax: [phone]   Delphina Cahill, M.D.  Fax: [phone]   Pietro Cassis. Alvan Dame, M.D.  Fax: [phone]

## 2010-09-11 NOTE — Op Note (Signed)
NAME:  Riley Griffith NO.:  [id number]   MEDICAL RECORD NO.:  [id number]           PATIENT TYPE:   LOCATION:                                 FACILITY:   PHYSICIAN:  Astrid Divine. Marcelino Scot, M.D.      DATE OF BIRTH:   DATE OF PROCEDURE:  06/17/2005  DATE OF DISCHARGE:                               OPERATIVE REPORT   PREOPERATIVE DIAGNOSIS:  Right grade 3B open tibia and ankle fracture.   POSTOPERATIVE DIAGNOSIS:  Right grade 3B open tibia and ankle fracture.   PROCEDURE:  Right below-the-knee amputation, Ertl tibiofibular  synostosis.   SURGEON:  Altamese Gandy, M.D.   ASSISTANT:  Erskine Emery, P.A. for closure.   ANESTHESIA:  General.   SPECIMENS:  None.   ESTIMATED BLOOD LOSS:  150 mL.   COMPLICATIONS:  None.   DISPOSITION:  PACU.   CONDITION:  Stable.   BRIEF SUMMARY OF INDICATIONS FOR PROCEDURE:  Riley Griffith is diabetic  male with pasture contamination of an open ankle fracture with failure  of osteosynthesis and soft tissue management.  After discussing the  risks and benefits of surgery, the patient wished to proceed with a  right below-knee amputation.  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  We discussed preoperative risks and benefits  of surgery including possibility of failure to prevent infection,  neurovascular injury, phantom pain, need for further surgery, malunion  or nonunion of the synthesis site, need further surgery and others.  After full discussion the patient wished to proceed.   DESCRIPTION OF PROCEDURE:  Riley Griffith was taken to operating room where  general anesthesia was induced.  His right lower extremity was prepped,  draped usual sterile fashion.  He remained on preoperative broad-  spectrum antibiotics.  The appropriate level for the tibial cut was  identified and the soft tissues then marked and full thickness cut made  at  down to fascia and then periosteum anteriorly.  Distally we preserved  as much tissue as possible, particularly of the posterior flap given his  diabetes.  We preserved the attachment to the fibula and then made a L-  shaped resection of the fibula at the level of the distal tibial cut  which was also fine-tune adjusted with a bur to create a grooved area  for the fibula to articulate.  Drill holes were placed in each side of  the fibular graft.  It was used to pass between the tibia and the fibula  and this was secured at the ends with #2 FiberWire.  We did obtain x-ray  to ensure appropriate horizontal angulation of the synostosed fragment.  We did leave soft tissues attached to this fragment in order to maintain  its vascularity.  We first created a very large periosteal sleeve off  the medial aspect of the tibia, taking both the periosteum as well as  bits off the most superficial layer of the tibial cortex in order for  this to go back and stimulate osteogenesis.  This sleeve was then taken  off the free fibular graft and sutured into place on the lateral side.  The wound was irrigated copiously of all bone dust and then a standard  layered closure performed using nylon for the superficial layer.  The  patient tolerated procedure quite well.  He was placed into a bulky  splint, awakened from anesthesia and transported to PACU in stable  condition.  Prior to closure we of course obtained hemostasis in  standard fashion with ligatures and nylon for the skin.  The nerves were  injected with Marcaine and truncated as far proximally as possible  within the muscle bellies to reduce the risk of a neuroma formation.   PROGNOSIS:  Riley Griffith should do well with this BKA given that we were  able to make these cuts out of the zone of injury.  Continue to watch  his wound closely, however, and we anticipate removal of the splint in 2  days.  As a diabetic he remains at increased risk for wound  breakdown  and infection.      Astrid Divine. Marcelino Scot, M.D.  Electronically Signed     MHH/MEDQ  D:  11/21/2006  T:  11/22/2006  Job:  [id number]

## 2010-09-11 NOTE — Op Note (Signed)
NAME:  Riley Griffith, Riley Griffith NO.:  [id number]   MEDICAL RECORD NO.:  [id number]          PATIENT TYPE:  INP   LOCATION:  5025                         FACILITY:  [facility]   PHYSICIAN:  Pietro Cassis. Alvan Dame, M.D.  DATE OF BIRTH:  Dec 09, 1923   DATE OF PROCEDURE:  06/13/2005  DATE OF DISCHARGE:                                 OPERATIVE REPORT   PREOPERATIVE DIAGNOSIS:  Grade IIIB open right ankle fracture, status post  initial incision and drainage and placement of a short leg splint on  June 11, 2005.   POSTOPERATIVE DIAGNOSIS:  Grade IIIB open right ankle fracture, status post  initial incision and drainage and placement of a short leg splint on  June 11, 2005.   FINDINGS:  The patient's wound edges on the medial flap were noted to be a  little on the dusky to bluish side approximately  maximum of less than 1 cm  at the apex of the flap.  Otherwise, the wounds appear to be relatively  healthy.  No sign of obvious infection.  No other debris.   OPERATION PERFORMED:  1.  Repeat incision and drainage of right ankle wound utilizing normal      saline solution and bacitracin fluid with bulb irrigators, not pulse      lavage.  2.  Open reduction internal fixation of the right ankle.  3.  Application of a medial wound, right leg wound VAC.   SURGEON:  Pietro Cassis. Alvan Dame, M.D.   ASSISTANT:  Karenann Cai, P.A.   ANESTHESIA:  General.   BLOOD LOSS:  Minimal.   SPECIMENS:  None.   COMPLICATIONS:  None.   DRAINS:  None other than the wound VAC.   INDICATIONS FOR PROCEDURE:  Riley Griffith is a 77 year old gentleman with two-  day history of grade IIIB open right ankle fracture with potential  involvement in farm area.  He has been treated with Ancef, gentamicin and  penicillin to cover gram positive anaerobic and aerobic bacteria as well as  gram negative bacteria.  He has been stable in the hospital since his  initial incision and drainage on the day of presentation on  June 11, 2005.  Consent was obtained and reviewed with the family and the patient for  repeat incision and drainage and internal fixation versus external fixation  versus application of wound VAC.   DESCRIPTION OF PROCEDURE:  The patient was brought to the operating theater.  Once adequate anesthesia was administered, the patient was positioned  supine.  The right lower extremity was prepped and draped in sterile fashion  with Betadine scrub and paint.   Following this, the patient was due for his Ancef and penicillin dosages  which were administered.  His right medial wound had the sutures removed and  prior to scrubbing and painting.  The right medial wound was then irrigated  with 3 L of normal saline solution followed by a liter of antibiotic  solution.  Following this debridement, incision was made for internal  fixation for stabilization.  Instability of the fracture fragment was  concerning for overall wound  healing and skin penetration.  Given this  decision, the lateral incision was made, sharp dissection was carried down.  The patient was noted to have a comminuted distal fibula fracture.   Identification of the bony fragments to one another was very difficult based  on the comminution.  I was able to determine based on radiographs determine  the appropriate leg length, fibular length.   Once this was determined, a pelvic recon type plate 35 locking plate was  applied to the lateral fibula and fixed with three screws proximally and two  screws distally.   There was a fragment of bone that was unable to be saved based on this  periosteum attachment and location and this was rongeured up and placed in  the bone defect as autologous bone graft.  Following positioning of this,  fluoroscopic images were utilized to ensure reduction.  Once this was  carried out and the wound reirrigated, I reapproximated the wound edges with  Vicryl and nylon sutures. Attention was then  directed to the medial side.   Upon discussion of this case with our orthopedic trauma surgeon, I reduced  the medial malleolar fragment to the tibia and then using cannulated screw  fixation through two small stab incisions, placed 45 mm 4.0 cancellous  screws.  This was done under fluoroscopic imaging to assure location and  reduction.  Ankle views including AP ankle as well as internally rotated  mortise view indicated stability of the ankle and maintenance of the  mortise.   Once I was satisfied with this, I reirrigated the wound.  Using a 2-0 nylon,  I was able to reapproximate safely only the very ends of the two limbs of  his lacerated skin.  Based on the appearance of the skin edges, I went ahead  and placed a wound VAC over this edge, the remaining wound.   The fibula was then fixed, dressed with Xeroform dressing sponges, ABDs, Ace  and cast padding. The remainder of the wound was then dressed into a sterile  bulky Jones dressing and a 10 thick 5 x 30 plaster splint was applied.   Please note that prior to application of the splint, the wound VAC sponge  was positioned and taped with the diaphragm for the tubing which was exited  distally.  This was able to hold suction through the use of the machine.   The wound VAC drain was reset at 125 mmHg pressure suction.   Once the plaster had set, the patient was extubated and brought to the  recovery room in stable condition.   I am going to place the patient on another 48 hours of antibiotics following  this procedure.  Probably going to be consulting the assistance of Dr. Helane Gunther as well as Plastic Surgery for further management of the patient.  Further recommendations and observation of the patient will be critical to  determine whether or not this medial wound heals or whether or not it  requires a muscle flap or if it requires amputation.  This has all been  reviewed with the patient.     Pietro Cassis Alvan Dame, M.D.   Electronically Signed     MDO/MEDQ  D:  06/13/2005  T:  06/14/2005  Job:  [id number]

## 2010-09-11 NOTE — H&P (Signed)
NAME:  Riley Griffith, TODOROVICH NO.:  [id number]   MEDICAL RECORD NO.:  [id number]          PATIENT TYPE:  INP   LOCATION:  [id number]                         FACILITY:  [facility]   PHYSICIAN:  Pietro Cassis. Alvan Dame, M.D.  DATE OF BIRTH:  1923-05-05   DATE OF ADMISSION:  06/11/2005  DATE OF DISCHARGE:                                HISTORY & PHYSICAL   PRINCIPAL DIAGNOSIS:  Right open ankle fracture.   SECONDARY DIAGNOSIS:  Unknown, but patient presented with a blood sugar of  600 as well as preventricular contractions.   HISTORY OF PRESENT ILLNESS:  Riley Griffith is a 77 year old gentleman who has  not had medical attention since he was in the [employer].  He was at home walking  through some fields when he slipped on some grass in the morning.  He  sustained an injury to his ankle, was unable to bear weight.  He had obvious  deformity and open fracture.  He basically dragged himself back to the field  that did have a cow pasture, to his car.  He subsequently drove to his home,  where he was able to get the attention of his wife.  He was brought to the  emergency room for evaluation with the above-noted fracture noted.  Initial  evaluation was carried out by the emergency room personnel.  We were  contacted.  We checked to make sure his tetanus was up to date as well as  providing him with Ancef, gentamicin and penicillin.  His wound was then  doused with Betadine and dressed into a splint.   We were subsequently asked for consultation to discuss the significance of  the injury, the location of the injury, his questionable diabetes, all  significant factors that would play out in the long-term results of this  injury.   PAST MEDICAL HISTORY:  Unknown.   PAST SURGICAL HISTORY:  None.   CURRENT MEDICATIONS:  Aspirin and vitamins.   ALLERGIES:  No known drug allergies.   SOCIAL HISTORY:  He is retired.  Denies any smoking or alcohol use.  He is a  Psychologist, sport and exercise.   REVIEW OF SYSTEMS:  Negative  other than that in the HPI.   EXAMINATION IN THE EMERGENCY ROOM:  GENERAL:  Mr. Cord was awake, alert  and oriented.  VITAL SIGNS:  His labs and vitals were normal.  No fevers.  HEAD AND NECK:  Within normal limits without any evidence of asymmetry or  slurred speech.  CHEST:  Clear to auscultation bilaterally.  CARDIAC:  Regular rate and rhythm, and there were some preventricular beats  noted on monitor.  ABDOMEN:  Soft and nontender with positive bowel sounds.  EXTREMITIES:  Examination of his right leg revealed at the time that he did  have a splint in place.  This was not taken down prior to the operating room  evaluation.  Please see previously-dictated operative note for details of  the examination.   Per the emergency room physician, he was noted to have a medial-sided  laceration that was fairly extensive with a flap, with exposed bone.  Attempted  reduction was carried out by the ER physicians prior to placement  of the splint.   There was blood on the field.   He did have brisk capillary refill.  His sensibility was diminished in his  lower extremities.   Radiographs reviewed indicate a displaced ankle fracture with supination-  external rotation pattern with questionable syndesmotic injury as well.   ASSESSMENT:  Grade II open right ankle fracture.   PLAN:  I reviewed with Mr. Warstler and his wife and daughter-in-law the  significant nature of his current injury.  He had received preoperative and  then will receive perioperative antibiotics for coverage of farmyard open  injuries.   The risks, benefits, and alternatives of open reduction and internal  fixation, initial stabilization and need for debridement and the risk for  potential below-knee amputation were all reviewed with the family  extensively and consent was obtained for surgery.      Pietro Cassis Alvan Dame, M.D.  Electronically Signed     MDO/MEDQ  D:  06/11/2005  T:  06/12/2005  Job:  [id number]

## 2010-09-11 NOTE — Discharge Summary (Signed)
NAME:  Riley Griffith, Riley Griffith NO.:  [id number]   MEDICAL RECORD NO.:  [id number]          PATIENT TYPE:  IPS   LOCATION:  4002                         FACILITY:  [facility]   PHYSICIAN:  Lauraine Rinne, P.A.  DATE OF BIRTH:  December 09, 1923   DATE OF ADMISSION:  06/21/2005  DATE OF DISCHARGE:  07/06/2005                                 DISCHARGE SUMMARY   ADDENDUM   Noted small sacral wound with order for Accuzyme to the sacrum with a cover  of a dry dressing, a home health nurse had been arranged for both Coumadin  therapy as well as monitoring of wound and dressing changes as advised. This  would need to be monitored closely which he could follow with his primary  M.D. All issues discussing pressure release had been discussed with wife and  family.      Lauraine Rinne, P.A.     DA/MEDQ  D:  07/06/2005  T:  07/07/2005  Job:  [id number]

## 2010-09-11 NOTE — Consult Note (Signed)
NAME:  Riley Griffith, Riley Griffith               ACCOUNT NO.:  [id number]   MEDICAL RECORD NO.:  [id number]          PATIENT TYPE:  INP   LOCATION:  1831                         FACILITY:  [facility]   PHYSICIAN:  Toby L. Fugate, D.O.   DATE OF BIRTH:  April 03, 1924   DATE OF CONSULTATION:  DATE OF DISCHARGE:                                   CONSULTATION   ADDENDUM:  The patient denies any history of chest pain, shortness of breath or edema.  He says that he has very good exercise tolerance.  He works around his arm  without any fatigue, chest pain or shortness of breath.  Until his fracture  today, he was in very good health.      Toby L. Fugate, D.O.  Electronically Signed     TLF/MEDQ  D:  06/11/2005  T:  06/12/2005  Job:  [id number]

## 2011-03-24 ENCOUNTER — Emergency Department (HOSPITAL_COMMUNITY)
Admission: EM | Admit: 2011-03-24 | Discharge: 2011-03-24 | Disposition: A | Discharge status: Home / Self Care | Payer: No Typology Code available for payment source | Attending: Emergency Medicine | Admitting: Emergency Medicine

## 2011-03-24 ENCOUNTER — Emergency Department (HOSPITAL_COMMUNITY): Payer: No Typology Code available for payment source

## 2011-03-24 DIAGNOSIS — Z23 Encounter for immunization: Secondary | ICD-10-CM | POA: Insufficient documentation

## 2011-03-24 DIAGNOSIS — S2239XA Fracture of one rib, unspecified side, initial encounter for closed fracture: Secondary | ICD-10-CM

## 2011-03-24 DIAGNOSIS — S61209A Unspecified open wound of unspecified finger without damage to nail, initial encounter: Secondary | ICD-10-CM | POA: Insufficient documentation

## 2011-03-24 DIAGNOSIS — R9389 Abnormal findings on diagnostic imaging of other specified body structures: Secondary | ICD-10-CM | POA: Insufficient documentation

## 2011-03-24 DIAGNOSIS — R109 Unspecified abdominal pain: Secondary | ICD-10-CM | POA: Insufficient documentation

## 2011-03-24 DIAGNOSIS — I10 Essential (primary) hypertension: Secondary | ICD-10-CM | POA: Insufficient documentation

## 2011-03-24 DIAGNOSIS — S88119A Complete traumatic amputation at level between knee and ankle, unspecified lower leg, initial encounter: Secondary | ICD-10-CM | POA: Insufficient documentation

## 2011-03-24 DIAGNOSIS — S298XXA Other specified injuries of thorax, initial encounter: Secondary | ICD-10-CM | POA: Insufficient documentation

## 2011-03-24 DIAGNOSIS — E119 Type 2 diabetes mellitus without complications: Secondary | ICD-10-CM | POA: Insufficient documentation

## 2011-03-24 DIAGNOSIS — S0003XA Contusion of scalp, initial encounter: Secondary | ICD-10-CM | POA: Insufficient documentation

## 2011-03-24 DIAGNOSIS — S2249XA Multiple fractures of ribs, unspecified side, initial encounter for closed fracture: Secondary | ICD-10-CM | POA: Insufficient documentation

## 2011-03-24 HISTORY — DX: Essential (primary) hypertension: I10

## 2011-03-24 HISTORY — DX: Disorder of kidney and ureter, unspecified: N28.9

## 2011-03-24 LAB — POCT I-STAT, CHEM 8
Calcium, Ion: 1.11 mmol/L — ABNORMAL LOW (ref 1.12–1.32)
Chloride: 106 mEq/L (ref 96–112)
Creatinine, Ser: 1.9 mg/dL — ABNORMAL HIGH (ref 0.50–1.35)
Glucose, Bld: 155 mg/dL — ABNORMAL HIGH (ref 70–99)
HCT: 36 % — ABNORMAL LOW (ref 39.0–52.0)
Hemoglobin: 12.2 g/dL — ABNORMAL LOW (ref 13.0–17.0)
Sodium: 140 mEq/L (ref 135–145)
TCO2: 24 mmol/L (ref 0–100)

## 2011-03-24 MED ORDER — HYDROCODONE-ACETAMINOPHEN 5-325 MG PO TABS: 1.0000 | ORAL_TABLET | Freq: Four times a day (QID) | ORAL | Status: AC | PRN

## 2011-03-24 MED ORDER — TETANUS-DIPHTH-ACELL PERTUSSIS 5-2.5-18.5 LF-MCG/0.5 IM SUSP
0.5000 mL | Freq: Once | INTRAMUSCULAR | Status: AC
  Administered 2011-03-24: 0.5 mL via INTRAMUSCULAR
  Filled 2011-03-24: qty 0.5
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
## 2011-03-24 NOTE — ED Notes (Signed)
Laceration to rt index finger cleaned and dressed. Pressure applied to wound. Dr. Roderic Palau aware of laceration to finger

## 2011-03-24 NOTE — ED Notes (Signed)
Riley Griffith in CT aware of CT WITHOUT contrast.

## 2011-03-24 NOTE — ED Notes (Signed)
Creat. 1.9. Dr Roderic Palau aware. CT without Contrast at this time

## 2011-04-30 DIAGNOSIS — E785 Hyperlipidemia, unspecified: Secondary | ICD-10-CM | POA: Diagnosis not present

## 2011-04-30 DIAGNOSIS — I10 Essential (primary) hypertension: Secondary | ICD-10-CM | POA: Diagnosis not present

## 2011-04-30 DIAGNOSIS — E119 Type 2 diabetes mellitus without complications: Secondary | ICD-10-CM | POA: Diagnosis not present

## 2011-05-05 DIAGNOSIS — E1159 Type 2 diabetes mellitus with other circulatory complications: Secondary | ICD-10-CM | POA: Diagnosis not present

## 2011-05-05 DIAGNOSIS — E119 Type 2 diabetes mellitus without complications: Secondary | ICD-10-CM | POA: Diagnosis not present

## 2011-05-10 DIAGNOSIS — E1139 Type 2 diabetes mellitus with other diabetic ophthalmic complication: Secondary | ICD-10-CM | POA: Diagnosis not present

## 2011-05-10 DIAGNOSIS — E11311 Type 2 diabetes mellitus with unspecified diabetic retinopathy with macular edema: Secondary | ICD-10-CM | POA: Diagnosis not present

## 2011-05-10 DIAGNOSIS — E11349 Type 2 diabetes mellitus with severe nonproliferative diabetic retinopathy without macular edema: Secondary | ICD-10-CM | POA: Diagnosis not present

## 2011-05-10 DIAGNOSIS — H35359 Cystoid macular degeneration, unspecified eye: Secondary | ICD-10-CM | POA: Diagnosis not present

## 2011-05-10 DIAGNOSIS — E1165 Type 2 diabetes mellitus with hyperglycemia: Secondary | ICD-10-CM | POA: Diagnosis not present

## 2011-05-18 DIAGNOSIS — E1165 Type 2 diabetes mellitus with hyperglycemia: Secondary | ICD-10-CM | POA: Diagnosis not present

## 2011-05-18 DIAGNOSIS — E1139 Type 2 diabetes mellitus with other diabetic ophthalmic complication: Secondary | ICD-10-CM | POA: Diagnosis not present

## 2011-05-18 DIAGNOSIS — E11359 Type 2 diabetes mellitus with proliferative diabetic retinopathy without macular edema: Secondary | ICD-10-CM | POA: Diagnosis not present

## 2011-05-18 DIAGNOSIS — E11349 Type 2 diabetes mellitus with severe nonproliferative diabetic retinopathy without macular edema: Secondary | ICD-10-CM | POA: Diagnosis not present

## 2011-05-18 DIAGNOSIS — E11339 Type 2 diabetes mellitus with moderate nonproliferative diabetic retinopathy without macular edema: Secondary | ICD-10-CM | POA: Diagnosis not present

## 2011-05-20 DIAGNOSIS — H40009 Preglaucoma, unspecified, unspecified eye: Secondary | ICD-10-CM | POA: Diagnosis not present

## 2011-06-02 DIAGNOSIS — H4010X0 Unspecified open-angle glaucoma, stage unspecified: Secondary | ICD-10-CM | POA: Diagnosis not present

## 2011-06-28 DIAGNOSIS — Z79899 Other long term (current) drug therapy: Secondary | ICD-10-CM | POA: Diagnosis not present

## 2011-06-28 DIAGNOSIS — N189 Chronic kidney disease, unspecified: Secondary | ICD-10-CM | POA: Diagnosis not present

## 2011-06-28 DIAGNOSIS — D649 Anemia, unspecified: Secondary | ICD-10-CM | POA: Diagnosis not present

## 2011-06-28 DIAGNOSIS — E559 Vitamin D deficiency, unspecified: Secondary | ICD-10-CM | POA: Diagnosis not present

## 2011-06-28 DIAGNOSIS — E1165 Type 2 diabetes mellitus with hyperglycemia: Secondary | ICD-10-CM | POA: Diagnosis not present

## 2011-06-28 DIAGNOSIS — R809 Proteinuria, unspecified: Secondary | ICD-10-CM | POA: Diagnosis not present

## 2011-07-14 DIAGNOSIS — E119 Type 2 diabetes mellitus without complications: Secondary | ICD-10-CM | POA: Diagnosis not present

## 2011-07-14 DIAGNOSIS — E1159 Type 2 diabetes mellitus with other circulatory complications: Secondary | ICD-10-CM | POA: Diagnosis not present

## 2011-07-19 DIAGNOSIS — E1165 Type 2 diabetes mellitus with hyperglycemia: Secondary | ICD-10-CM | POA: Diagnosis not present

## 2011-07-19 DIAGNOSIS — I10 Essential (primary) hypertension: Secondary | ICD-10-CM | POA: Diagnosis not present

## 2011-07-19 DIAGNOSIS — E1129 Type 2 diabetes mellitus with other diabetic kidney complication: Secondary | ICD-10-CM | POA: Diagnosis not present

## 2011-07-19 DIAGNOSIS — R809 Proteinuria, unspecified: Secondary | ICD-10-CM | POA: Diagnosis not present

## 2011-07-30 DIAGNOSIS — I10 Essential (primary) hypertension: Secondary | ICD-10-CM | POA: Diagnosis not present

## 2011-07-30 DIAGNOSIS — E785 Hyperlipidemia, unspecified: Secondary | ICD-10-CM | POA: Diagnosis not present

## 2011-07-30 DIAGNOSIS — E119 Type 2 diabetes mellitus without complications: Secondary | ICD-10-CM | POA: Diagnosis not present

## 2011-09-07 DIAGNOSIS — E11359 Type 2 diabetes mellitus with proliferative diabetic retinopathy without macular edema: Secondary | ICD-10-CM | POA: Diagnosis not present

## 2011-09-07 DIAGNOSIS — E11339 Type 2 diabetes mellitus with moderate nonproliferative diabetic retinopathy without macular edema: Secondary | ICD-10-CM | POA: Diagnosis not present

## 2011-09-07 DIAGNOSIS — E11311 Type 2 diabetes mellitus with unspecified diabetic retinopathy with macular edema: Secondary | ICD-10-CM | POA: Diagnosis not present

## 2011-09-07 DIAGNOSIS — E1139 Type 2 diabetes mellitus with other diabetic ophthalmic complication: Secondary | ICD-10-CM | POA: Diagnosis not present

## 2011-09-07 DIAGNOSIS — E1165 Type 2 diabetes mellitus with hyperglycemia: Secondary | ICD-10-CM | POA: Diagnosis not present

## 2011-09-07 DIAGNOSIS — H35359 Cystoid macular degeneration, unspecified eye: Secondary | ICD-10-CM | POA: Diagnosis not present

## 2011-09-07 DIAGNOSIS — E11349 Type 2 diabetes mellitus with severe nonproliferative diabetic retinopathy without macular edema: Secondary | ICD-10-CM | POA: Diagnosis not present

## 2011-09-22 DIAGNOSIS — E1159 Type 2 diabetes mellitus with other circulatory complications: Secondary | ICD-10-CM | POA: Diagnosis not present

## 2011-09-22 DIAGNOSIS — E119 Type 2 diabetes mellitus without complications: Secondary | ICD-10-CM | POA: Diagnosis not present

## 2011-09-27 DIAGNOSIS — L97509 Non-pressure chronic ulcer of other part of unspecified foot with unspecified severity: Secondary | ICD-10-CM | POA: Diagnosis not present

## 2011-09-27 DIAGNOSIS — E119 Type 2 diabetes mellitus without complications: Secondary | ICD-10-CM | POA: Diagnosis not present

## 2011-10-01 DIAGNOSIS — L84 Corns and callosities: Secondary | ICD-10-CM | POA: Diagnosis not present

## 2011-10-01 DIAGNOSIS — E119 Type 2 diabetes mellitus without complications: Secondary | ICD-10-CM | POA: Diagnosis not present

## 2011-10-12 DIAGNOSIS — E119 Type 2 diabetes mellitus without complications: Secondary | ICD-10-CM | POA: Diagnosis not present

## 2011-10-12 DIAGNOSIS — L97409 Non-pressure chronic ulcer of unspecified heel and midfoot with unspecified severity: Secondary | ICD-10-CM | POA: Diagnosis not present

## 2011-11-02 DIAGNOSIS — N189 Chronic kidney disease, unspecified: Secondary | ICD-10-CM | POA: Diagnosis not present

## 2011-11-02 DIAGNOSIS — I10 Essential (primary) hypertension: Secondary | ICD-10-CM | POA: Diagnosis not present

## 2011-11-02 DIAGNOSIS — E119 Type 2 diabetes mellitus without complications: Secondary | ICD-10-CM | POA: Diagnosis not present

## 2011-11-02 DIAGNOSIS — E785 Hyperlipidemia, unspecified: Secondary | ICD-10-CM | POA: Diagnosis not present

## 2011-11-30 DIAGNOSIS — E1159 Type 2 diabetes mellitus with other circulatory complications: Secondary | ICD-10-CM | POA: Diagnosis not present

## 2011-11-30 DIAGNOSIS — E119 Type 2 diabetes mellitus without complications: Secondary | ICD-10-CM | POA: Diagnosis not present

## 2011-12-02 DIAGNOSIS — R809 Proteinuria, unspecified: Secondary | ICD-10-CM | POA: Diagnosis not present

## 2011-12-02 DIAGNOSIS — D649 Anemia, unspecified: Secondary | ICD-10-CM | POA: Diagnosis not present

## 2011-12-02 DIAGNOSIS — Z79899 Other long term (current) drug therapy: Secondary | ICD-10-CM | POA: Diagnosis not present

## 2011-12-02 DIAGNOSIS — N189 Chronic kidney disease, unspecified: Secondary | ICD-10-CM | POA: Diagnosis not present

## 2011-12-07 DIAGNOSIS — R809 Proteinuria, unspecified: Secondary | ICD-10-CM | POA: Diagnosis not present

## 2011-12-07 DIAGNOSIS — I10 Essential (primary) hypertension: Secondary | ICD-10-CM | POA: Diagnosis not present

## 2011-12-07 DIAGNOSIS — D509 Iron deficiency anemia, unspecified: Secondary | ICD-10-CM | POA: Diagnosis not present

## 2011-12-07 DIAGNOSIS — E875 Hyperkalemia: Secondary | ICD-10-CM | POA: Diagnosis not present

## 2011-12-07 DIAGNOSIS — I739 Peripheral vascular disease, unspecified: Secondary | ICD-10-CM | POA: Diagnosis not present

## 2011-12-15 DIAGNOSIS — R609 Edema, unspecified: Secondary | ICD-10-CM | POA: Diagnosis not present

## 2011-12-15 DIAGNOSIS — L0291 Cutaneous abscess, unspecified: Secondary | ICD-10-CM | POA: Diagnosis not present

## 2011-12-29 DIAGNOSIS — M869 Osteomyelitis, unspecified: Secondary | ICD-10-CM | POA: Diagnosis not present

## 2011-12-29 DIAGNOSIS — L02419 Cutaneous abscess of limb, unspecified: Secondary | ICD-10-CM | POA: Diagnosis not present

## 2011-12-29 DIAGNOSIS — L03119 Cellulitis of unspecified part of limb: Secondary | ICD-10-CM | POA: Diagnosis not present

## 2011-12-29 DIAGNOSIS — M5430 Sciatica, unspecified side: Secondary | ICD-10-CM | POA: Diagnosis not present

## 2011-12-29 DIAGNOSIS — B379 Candidiasis, unspecified: Secondary | ICD-10-CM | POA: Diagnosis not present

## 2011-12-29 DIAGNOSIS — S88119A Complete traumatic amputation at level between knee and ankle, unspecified lower leg, initial encounter: Secondary | ICD-10-CM | POA: Diagnosis not present

## 2011-12-31 DIAGNOSIS — T8740 Infection of amputation stump, unspecified extremity: Secondary | ICD-10-CM | POA: Diagnosis not present

## 2011-12-31 DIAGNOSIS — Z4801 Encounter for change or removal of surgical wound dressing: Secondary | ICD-10-CM | POA: Diagnosis not present

## 2011-12-31 DIAGNOSIS — E119 Type 2 diabetes mellitus without complications: Secondary | ICD-10-CM | POA: Diagnosis not present

## 2011-12-31 DIAGNOSIS — Z794 Long term (current) use of insulin: Secondary | ICD-10-CM | POA: Diagnosis not present

## 2011-12-31 DIAGNOSIS — I10 Essential (primary) hypertension: Secondary | ICD-10-CM | POA: Diagnosis not present

## 2011-12-31 DIAGNOSIS — M669 Spontaneous rupture of unspecified tendon: Secondary | ICD-10-CM | POA: Diagnosis not present

## 2012-01-01 DIAGNOSIS — T8740 Infection of amputation stump, unspecified extremity: Secondary | ICD-10-CM | POA: Diagnosis not present

## 2012-01-01 DIAGNOSIS — M669 Spontaneous rupture of unspecified tendon: Secondary | ICD-10-CM | POA: Diagnosis not present

## 2012-01-01 DIAGNOSIS — Z794 Long term (current) use of insulin: Secondary | ICD-10-CM | POA: Diagnosis not present

## 2012-01-01 DIAGNOSIS — Z4801 Encounter for change or removal of surgical wound dressing: Secondary | ICD-10-CM | POA: Diagnosis not present

## 2012-01-01 DIAGNOSIS — I10 Essential (primary) hypertension: Secondary | ICD-10-CM | POA: Diagnosis not present

## 2012-01-01 DIAGNOSIS — E119 Type 2 diabetes mellitus without complications: Secondary | ICD-10-CM | POA: Diagnosis not present

## 2012-01-02 DIAGNOSIS — Z4801 Encounter for change or removal of surgical wound dressing: Secondary | ICD-10-CM | POA: Diagnosis not present

## 2012-01-02 DIAGNOSIS — E119 Type 2 diabetes mellitus without complications: Secondary | ICD-10-CM | POA: Diagnosis not present

## 2012-01-02 DIAGNOSIS — M669 Spontaneous rupture of unspecified tendon: Secondary | ICD-10-CM | POA: Diagnosis not present

## 2012-01-02 DIAGNOSIS — Z794 Long term (current) use of insulin: Secondary | ICD-10-CM | POA: Diagnosis not present

## 2012-01-02 DIAGNOSIS — I10 Essential (primary) hypertension: Secondary | ICD-10-CM | POA: Diagnosis not present

## 2012-01-02 DIAGNOSIS — T8740 Infection of amputation stump, unspecified extremity: Secondary | ICD-10-CM | POA: Diagnosis not present

## 2012-01-03 DIAGNOSIS — M669 Spontaneous rupture of unspecified tendon: Secondary | ICD-10-CM | POA: Diagnosis not present

## 2012-01-03 DIAGNOSIS — Z794 Long term (current) use of insulin: Secondary | ICD-10-CM | POA: Diagnosis not present

## 2012-01-03 DIAGNOSIS — T8740 Infection of amputation stump, unspecified extremity: Secondary | ICD-10-CM | POA: Diagnosis not present

## 2012-01-03 DIAGNOSIS — I10 Essential (primary) hypertension: Secondary | ICD-10-CM | POA: Diagnosis not present

## 2012-01-03 DIAGNOSIS — Z4801 Encounter for change or removal of surgical wound dressing: Secondary | ICD-10-CM | POA: Diagnosis not present

## 2012-01-03 DIAGNOSIS — E119 Type 2 diabetes mellitus without complications: Secondary | ICD-10-CM | POA: Diagnosis not present

## 2012-01-04 DIAGNOSIS — T8740 Infection of amputation stump, unspecified extremity: Secondary | ICD-10-CM | POA: Diagnosis not present

## 2012-01-04 DIAGNOSIS — M669 Spontaneous rupture of unspecified tendon: Secondary | ICD-10-CM | POA: Diagnosis not present

## 2012-01-04 DIAGNOSIS — Z794 Long term (current) use of insulin: Secondary | ICD-10-CM | POA: Diagnosis not present

## 2012-01-04 DIAGNOSIS — E119 Type 2 diabetes mellitus without complications: Secondary | ICD-10-CM | POA: Diagnosis not present

## 2012-01-04 DIAGNOSIS — Z4801 Encounter for change or removal of surgical wound dressing: Secondary | ICD-10-CM | POA: Diagnosis not present

## 2012-01-04 DIAGNOSIS — I10 Essential (primary) hypertension: Secondary | ICD-10-CM | POA: Diagnosis not present

## 2012-01-05 DIAGNOSIS — L03119 Cellulitis of unspecified part of limb: Secondary | ICD-10-CM | POA: Diagnosis not present

## 2012-01-05 DIAGNOSIS — L02419 Cutaneous abscess of limb, unspecified: Secondary | ICD-10-CM | POA: Diagnosis not present

## 2012-01-06 DIAGNOSIS — M669 Spontaneous rupture of unspecified tendon: Secondary | ICD-10-CM | POA: Diagnosis not present

## 2012-01-06 DIAGNOSIS — Z4801 Encounter for change or removal of surgical wound dressing: Secondary | ICD-10-CM | POA: Diagnosis not present

## 2012-01-06 DIAGNOSIS — I10 Essential (primary) hypertension: Secondary | ICD-10-CM | POA: Diagnosis not present

## 2012-01-06 DIAGNOSIS — Z794 Long term (current) use of insulin: Secondary | ICD-10-CM | POA: Diagnosis not present

## 2012-01-06 DIAGNOSIS — T8740 Infection of amputation stump, unspecified extremity: Secondary | ICD-10-CM | POA: Diagnosis not present

## 2012-01-06 DIAGNOSIS — E119 Type 2 diabetes mellitus without complications: Secondary | ICD-10-CM | POA: Diagnosis not present

## 2012-01-07 DIAGNOSIS — Z794 Long term (current) use of insulin: Secondary | ICD-10-CM | POA: Diagnosis not present

## 2012-01-07 DIAGNOSIS — E119 Type 2 diabetes mellitus without complications: Secondary | ICD-10-CM | POA: Diagnosis not present

## 2012-01-07 DIAGNOSIS — I10 Essential (primary) hypertension: Secondary | ICD-10-CM | POA: Diagnosis not present

## 2012-01-07 DIAGNOSIS — M669 Spontaneous rupture of unspecified tendon: Secondary | ICD-10-CM | POA: Diagnosis not present

## 2012-01-07 DIAGNOSIS — T8740 Infection of amputation stump, unspecified extremity: Secondary | ICD-10-CM | POA: Diagnosis not present

## 2012-01-07 DIAGNOSIS — Z4801 Encounter for change or removal of surgical wound dressing: Secondary | ICD-10-CM | POA: Diagnosis not present

## 2012-01-08 DIAGNOSIS — M669 Spontaneous rupture of unspecified tendon: Secondary | ICD-10-CM | POA: Diagnosis not present

## 2012-01-08 DIAGNOSIS — Z794 Long term (current) use of insulin: Secondary | ICD-10-CM | POA: Diagnosis not present

## 2012-01-08 DIAGNOSIS — E119 Type 2 diabetes mellitus without complications: Secondary | ICD-10-CM | POA: Diagnosis not present

## 2012-01-08 DIAGNOSIS — Z4801 Encounter for change or removal of surgical wound dressing: Secondary | ICD-10-CM | POA: Diagnosis not present

## 2012-01-08 DIAGNOSIS — I10 Essential (primary) hypertension: Secondary | ICD-10-CM | POA: Diagnosis not present

## 2012-01-08 DIAGNOSIS — T8740 Infection of amputation stump, unspecified extremity: Secondary | ICD-10-CM | POA: Diagnosis not present

## 2012-01-09 DIAGNOSIS — T8740 Infection of amputation stump, unspecified extremity: Secondary | ICD-10-CM | POA: Diagnosis not present

## 2012-01-09 DIAGNOSIS — M669 Spontaneous rupture of unspecified tendon: Secondary | ICD-10-CM | POA: Diagnosis not present

## 2012-01-09 DIAGNOSIS — I10 Essential (primary) hypertension: Secondary | ICD-10-CM | POA: Diagnosis not present

## 2012-01-09 DIAGNOSIS — Z794 Long term (current) use of insulin: Secondary | ICD-10-CM | POA: Diagnosis not present

## 2012-01-09 DIAGNOSIS — Z4801 Encounter for change or removal of surgical wound dressing: Secondary | ICD-10-CM | POA: Diagnosis not present

## 2012-01-09 DIAGNOSIS — E119 Type 2 diabetes mellitus without complications: Secondary | ICD-10-CM | POA: Diagnosis not present

## 2012-01-10 DIAGNOSIS — M669 Spontaneous rupture of unspecified tendon: Secondary | ICD-10-CM | POA: Diagnosis not present

## 2012-01-10 DIAGNOSIS — Z4801 Encounter for change or removal of surgical wound dressing: Secondary | ICD-10-CM | POA: Diagnosis not present

## 2012-01-10 DIAGNOSIS — Z794 Long term (current) use of insulin: Secondary | ICD-10-CM | POA: Diagnosis not present

## 2012-01-10 DIAGNOSIS — T8740 Infection of amputation stump, unspecified extremity: Secondary | ICD-10-CM | POA: Diagnosis not present

## 2012-01-10 DIAGNOSIS — I10 Essential (primary) hypertension: Secondary | ICD-10-CM | POA: Diagnosis not present

## 2012-01-10 DIAGNOSIS — E119 Type 2 diabetes mellitus without complications: Secondary | ICD-10-CM | POA: Diagnosis not present

## 2012-01-11 ENCOUNTER — Encounter: Payer: Self-pay | Admitting: Infectious Disease

## 2012-01-11 ENCOUNTER — Ambulatory Visit (INDEPENDENT_AMBULATORY_CARE_PROVIDER_SITE_OTHER): Payer: Medicare Other | Admitting: Infectious Disease

## 2012-01-11 VITALS — BP 144/75 | HR 54 | Temp 98.1°F

## 2012-01-11 DIAGNOSIS — M869 Osteomyelitis, unspecified: Secondary | ICD-10-CM | POA: Diagnosis not present

## 2012-01-11 DIAGNOSIS — E119 Type 2 diabetes mellitus without complications: Secondary | ICD-10-CM | POA: Diagnosis not present

## 2012-01-11 DIAGNOSIS — B379 Candidiasis, unspecified: Secondary | ICD-10-CM | POA: Diagnosis not present

## 2012-01-11 DIAGNOSIS — M7110 Other infective bursitis, unspecified site: Secondary | ICD-10-CM

## 2012-01-11 DIAGNOSIS — M7150 Other bursitis, not elsewhere classified, unspecified site: Secondary | ICD-10-CM

## 2012-01-11 MED ORDER — AMOXICILLIN-POT CLAVULANATE 875-125 MG PO TABS: 1.0000 | ORAL_TABLET | Freq: Two times a day (BID) | ORAL | Status: DC

## 2012-01-11 MED ORDER — FLUCONAZOLE 200 MG PO TABS: 400.0000 mg | ORAL_TABLET | Freq: Every day | ORAL | Status: DC

## 2012-01-11 NOTE — Patient Instructions (Addendum)
Mr Riley Griffith I want you to increase your fluconazole to two 200mg  tablets = 400mg  until you hear from me  You will need to be on this medicine for a minimum of 2 months, but more likely 6 months  I am also sending in script for augmentin which I want you to continue taking for the next 6 weeks  STOP TAKING THE LEVAQUIN (LEVOFLOXACIN)  We will obtain blood work today and also obtain an MRI at [facility] to evaluate your stump site

## 2012-01-11 NOTE — Assessment & Plan Note (Signed)
See above plan. Only concern was that the Candida species was not formally identified as Candida albicans which would've typically been sensitive to fluconazole. The cultures were unfortunately thrown out as they were wound cultures. We will continue with fluconazole under the assumption that the organism was sensitive to fluconazole.

## 2012-01-11 NOTE — Progress Notes (Signed)
Subjective:    Patient ID: Riley Griffith, male    DOB: 08-06-1923, 78 y.o.   MRN: [id number]  HPI  78 year old man with DM,  Who had prior BKA on the right side who developed persistent bursitis over heterotopic bone growingoff the tip of his tibia. He had developed erythema at stump site and purulent drainge. He had been placed on keflex by Dr. Nevada Crane, then levaquin without improvement in swelling erythema, pain or drainage. Dr Altamese Leelanau saw the patient and performed an incision 2 cm into the thinnest area of his ulcer. 40 mL of serous material and hematoma was evacuated. Sterile Q-tip was inserted probe used to breakup septum underneath. Cultures were sent from this material. They have grown a candida species. Unfortunately the lab did not speciate the Candida species that grew out from the culture on the fourth. Night as it descends defecation a susceptibility testing. The patient has been on fluconazole at a dose of 200 mg sent the culture data came back. He is also continued on levofloxacin and Augmentin. Patient believes he had bone films performed on plain x-rays to look for possible osteomyelitis. Patient was sent to [place] for evaluation and treatment of possible fungal septic bursitis Dense having been his stump cleaned by Dr. Marcelino Scot and after antibiotics have been given he has had improvement with less drainage from the site.. I spent greater than 60 minutes with the patient including greater than 50% of time in face to face counsel of the patient and in coordination of their care.  Review of Systems  Constitutional: Negative for fever, chills, diaphoresis, activity change, appetite change, fatigue and unexpected weight change.  HENT: Negative for congestion, sore throat, rhinorrhea, sneezing, trouble swallowing and sinus pressure.   Eyes: Negative for photophobia and visual disturbance.  Respiratory: Negative for cough, chest tightness, shortness of breath, wheezing and stridor.     Cardiovascular: Negative for chest pain, palpitations and leg swelling.  Gastrointestinal: Negative for nausea, vomiting, abdominal pain, diarrhea, constipation, blood in stool, abdominal distention and anal bleeding.  Genitourinary: Negative for dysuria, hematuria, flank pain and difficulty urinating.  Musculoskeletal: Negative for myalgias, back pain, joint swelling, arthralgias and gait problem.  Skin: Positive for color change and wound. Negative for pallor and rash.  Neurological: Negative for dizziness, tremors, weakness and light-headedness.  Hematological: Negative for adenopathy. Does not bruise/bleed easily.  Psychiatric/Behavioral: Negative for behavioral problems, confusion, disturbed wake/sleep cycle, dysphoric mood, decreased concentration and agitation.       Objective:   Physical Exam  Constitutional: He is oriented to person, place, and time. No distress.  HENT:  Head: Normocephalic and atraumatic.  Mouth/Throat: Oropharynx is clear and moist. No oropharyngeal exudate.  Eyes: Conjunctivae normal and EOM are normal. Pupils are equal, round, and reactive to light. No scleral icterus.  Neck: Normal range of motion. Neck supple.  Cardiovascular: Normal rate, regular rhythm and normal heart sounds.  Exam reveals no gallop and no friction rub.   No murmur heard. Pulmonary/Chest: Effort normal and breath sounds normal. No respiratory distress.  Abdominal: He exhibits no distension. There is no tenderness. There is no rebound.  Musculoskeletal: He exhibits no edema and no tenderness.       Legs: Neurological: He is alert and oriented to person, place, and time. He exhibits normal muscle tone. Coordination normal.  Skin: Skin is warm and dry. He is not diaphoretic. There is erythema. No pallor.  Psychiatric: He has a normal mood and affect. His behavior  is normal. Judgment and thought content normal.          Assessment & Plan:  Osteomyelitis of tibia  I'm concerned  about possibility of deep infection of the tibia itself. We'll obtain MRI film. Altered a sedimentation rate C-reactive protein competent metabolic panel and CBC. In the interim we'll continue the patient on fluconazole as well as Augmentin and discontinue the levofloxacin. It is not clear if the Candida species isolated from the wound represented a true pathogen deep in the tissue but I am concerned that it may indeed represent a pathogen. Unfortunately it was not identified her speciate its we do not know what type Candida species it was whether the species was likely to be sensitive to fluconazole. However statistically speaking most Candida species are susceptible, with the exception of Candida glabrata. We will therefore continue fluconazole as above along with Augmentin to cover for streptococcal species sensitives back staph as well. Adjust her further plan based on MRI findings.  Septic bursitis See above.  Candida infection See above plan. Only concern was that the Candida species was not formally identified as Candida albicans which would've typically been sensitive to fluconazole. The cultures were unfortunately thrown out as they were wound cultures. We will continue with fluconazole under the assumption that the organism was sensitive to fluconazole.

## 2012-01-11 NOTE — Assessment & Plan Note (Signed)
See above

## 2012-01-11 NOTE — Assessment & Plan Note (Signed)
I'm concerned about possibility of deep infection of the tibia itself. We'll obtain MRI film. Altered a sedimentation rate C-reactive protein competent metabolic panel and CBC. In the interim we'll continue the patient on fluconazole as well as Augmentin and discontinue the levofloxacin. It is not clear if the Candida species isolated from the wound represented a true pathogen deep in the tissue but I am concerned that it may indeed represent a pathogen. Unfortunately it was not identified her speciate its we do not know what type Candida species it was whether the species was likely to be sensitive to fluconazole. However statistically speaking most Candida species are susceptible, with the exception of Candida glabrata. We will therefore continue fluconazole as above along with Augmentin to cover for streptococcal species sensitives back staph as well. Adjust her further plan based on MRI findings.

## 2012-01-12 DIAGNOSIS — E119 Type 2 diabetes mellitus without complications: Secondary | ICD-10-CM | POA: Diagnosis not present

## 2012-01-12 DIAGNOSIS — Z794 Long term (current) use of insulin: Secondary | ICD-10-CM | POA: Diagnosis not present

## 2012-01-12 DIAGNOSIS — M669 Spontaneous rupture of unspecified tendon: Secondary | ICD-10-CM | POA: Diagnosis not present

## 2012-01-12 DIAGNOSIS — I10 Essential (primary) hypertension: Secondary | ICD-10-CM | POA: Diagnosis not present

## 2012-01-12 DIAGNOSIS — Z4801 Encounter for change or removal of surgical wound dressing: Secondary | ICD-10-CM | POA: Diagnosis not present

## 2012-01-12 DIAGNOSIS — T8740 Infection of amputation stump, unspecified extremity: Secondary | ICD-10-CM | POA: Diagnosis not present

## 2012-01-12 LAB — COMPLETE METABOLIC PANEL WITH GFR
AST: 23 U/L (ref 0–37)
Albumin: 3.9 g/dL (ref 3.5–5.2)
Alkaline Phosphatase: 61 U/L (ref 39–117)
Chloride: 106 mEq/L (ref 96–112)
GFR, Est Non African American: 27 mL/min — ABNORMAL LOW
Glucose, Bld: 151 mg/dL — ABNORMAL HIGH (ref 70–99)
Potassium: 5.1 mEq/L (ref 3.5–5.3)
Sodium: 138 mEq/L (ref 135–145)
Total Protein: 6.2 g/dL (ref 6.0–8.3)

## 2012-01-12 LAB — CBC WITH DIFFERENTIAL/PLATELET
Basophils Absolute: 0.1 10*3/uL (ref 0.0–0.1)
Basophils Relative: 1 % (ref 0–1)
Eosinophils Relative: 4 % (ref 0–5)
HCT: 34 % — ABNORMAL LOW (ref 39.0–52.0)
MCHC: 33.8 g/dL (ref 30.0–36.0)
MCV: 91.9 fL (ref 78.0–100.0)
Monocytes Absolute: 0.6 10*3/uL (ref 0.1–1.0)
RDW: 13.9 % (ref 11.5–15.5)

## 2012-01-13 DIAGNOSIS — Z794 Long term (current) use of insulin: Secondary | ICD-10-CM | POA: Diagnosis not present

## 2012-01-13 DIAGNOSIS — Z4801 Encounter for change or removal of surgical wound dressing: Secondary | ICD-10-CM | POA: Diagnosis not present

## 2012-01-13 DIAGNOSIS — I10 Essential (primary) hypertension: Secondary | ICD-10-CM | POA: Diagnosis not present

## 2012-01-13 DIAGNOSIS — T8740 Infection of amputation stump, unspecified extremity: Secondary | ICD-10-CM | POA: Diagnosis not present

## 2012-01-13 DIAGNOSIS — E119 Type 2 diabetes mellitus without complications: Secondary | ICD-10-CM | POA: Diagnosis not present

## 2012-01-13 DIAGNOSIS — M669 Spontaneous rupture of unspecified tendon: Secondary | ICD-10-CM | POA: Diagnosis not present

## 2012-01-14 ENCOUNTER — Ambulatory Visit (HOSPITAL_COMMUNITY)
Admission: RE | Admit: 2012-01-14 | Discharge: 2012-01-14 | Disposition: A | Discharge status: Home / Self Care | Payer: Medicare Other | Source: Ambulatory Visit | Attending: Infectious Disease | Admitting: Infectious Disease

## 2012-01-14 ENCOUNTER — Other Ambulatory Visit: Payer: Self-pay | Admitting: Infectious Disease

## 2012-01-14 DIAGNOSIS — L02419 Cutaneous abscess of limb, unspecified: Secondary | ICD-10-CM | POA: Insufficient documentation

## 2012-01-14 DIAGNOSIS — S88119A Complete traumatic amputation at level between knee and ankle, unspecified lower leg, initial encounter: Secondary | ICD-10-CM | POA: Insufficient documentation

## 2012-01-14 DIAGNOSIS — M869 Osteomyelitis, unspecified: Secondary | ICD-10-CM

## 2012-01-14 DIAGNOSIS — L03119 Cellulitis of unspecified part of limb: Secondary | ICD-10-CM | POA: Diagnosis not present

## 2012-01-17 DIAGNOSIS — T8740 Infection of amputation stump, unspecified extremity: Secondary | ICD-10-CM | POA: Diagnosis not present

## 2012-01-17 DIAGNOSIS — I10 Essential (primary) hypertension: Secondary | ICD-10-CM | POA: Diagnosis not present

## 2012-01-17 DIAGNOSIS — Z4801 Encounter for change or removal of surgical wound dressing: Secondary | ICD-10-CM | POA: Diagnosis not present

## 2012-01-17 DIAGNOSIS — M669 Spontaneous rupture of unspecified tendon: Secondary | ICD-10-CM | POA: Diagnosis not present

## 2012-01-17 DIAGNOSIS — Z794 Long term (current) use of insulin: Secondary | ICD-10-CM | POA: Diagnosis not present

## 2012-01-17 DIAGNOSIS — E119 Type 2 diabetes mellitus without complications: Secondary | ICD-10-CM | POA: Diagnosis not present

## 2012-01-19 DIAGNOSIS — L03119 Cellulitis of unspecified part of limb: Secondary | ICD-10-CM | POA: Diagnosis not present

## 2012-01-19 DIAGNOSIS — L02419 Cutaneous abscess of limb, unspecified: Secondary | ICD-10-CM | POA: Diagnosis not present

## 2012-01-20 ENCOUNTER — Encounter (HOSPITAL_COMMUNITY): Payer: Self-pay | Admitting: Pharmacy Technician

## 2012-01-21 DIAGNOSIS — M669 Spontaneous rupture of unspecified tendon: Secondary | ICD-10-CM | POA: Diagnosis not present

## 2012-01-21 DIAGNOSIS — I10 Essential (primary) hypertension: Secondary | ICD-10-CM | POA: Diagnosis not present

## 2012-01-21 DIAGNOSIS — T8740 Infection of amputation stump, unspecified extremity: Secondary | ICD-10-CM | POA: Diagnosis not present

## 2012-01-21 DIAGNOSIS — Z794 Long term (current) use of insulin: Secondary | ICD-10-CM | POA: Diagnosis not present

## 2012-01-21 DIAGNOSIS — E119 Type 2 diabetes mellitus without complications: Secondary | ICD-10-CM | POA: Diagnosis not present

## 2012-01-21 DIAGNOSIS — Z4801 Encounter for change or removal of surgical wound dressing: Secondary | ICD-10-CM | POA: Diagnosis not present

## 2012-01-24 ENCOUNTER — Encounter (HOSPITAL_COMMUNITY)
Admission: RE | Admit: 2012-01-24 | Discharge: 2012-01-24 | Disposition: A | Discharge status: Home / Self Care | Payer: Medicare Other | Source: Ambulatory Visit | Attending: Orthopedic Surgery | Admitting: Orthopedic Surgery

## 2012-01-24 ENCOUNTER — Encounter (HOSPITAL_COMMUNITY): Payer: Self-pay

## 2012-01-24 DIAGNOSIS — T8740 Infection of amputation stump, unspecified extremity: Secondary | ICD-10-CM | POA: Diagnosis not present

## 2012-01-24 DIAGNOSIS — M869 Osteomyelitis, unspecified: Secondary | ICD-10-CM | POA: Diagnosis not present

## 2012-01-24 DIAGNOSIS — Z01811 Encounter for preprocedural respiratory examination: Secondary | ICD-10-CM | POA: Diagnosis not present

## 2012-01-24 DIAGNOSIS — E119 Type 2 diabetes mellitus without complications: Secondary | ICD-10-CM | POA: Diagnosis not present

## 2012-01-24 DIAGNOSIS — I10 Essential (primary) hypertension: Secondary | ICD-10-CM | POA: Diagnosis not present

## 2012-01-24 LAB — COMPREHENSIVE METABOLIC PANEL
AST: 20 U/L (ref 0–37)
Alkaline Phosphatase: 69 U/L (ref 39–117)
CO2: 24 mEq/L (ref 19–32)
Chloride: 104 mEq/L (ref 96–112)
Creatinine, Ser: 2.11 mg/dL — ABNORMAL HIGH (ref 0.50–1.35)
GFR calc non Af Amer: 27 mL/min — ABNORMAL LOW (ref >90)
Potassium: 5 mEq/L (ref 3.5–5.1)
Total Bilirubin: 0.2 mg/dL — ABNORMAL LOW (ref 0.3–1.2)

## 2012-01-24 LAB — CBC WITH DIFFERENTIAL/PLATELET
Basophils Absolute: 0 10*3/uL (ref 0.0–0.1)
HCT: 35.7 % — ABNORMAL LOW (ref 39.0–52.0)
Hemoglobin: 12.1 g/dL — ABNORMAL LOW (ref 13.0–17.0)
Lymphocytes Relative: 20 % (ref 12–46)
Monocytes Absolute: 0.6 10*3/uL (ref 0.1–1.0)
Monocytes Relative: 8 % (ref 3–12)
Neutro Abs: 5 10*3/uL (ref 1.7–7.7)
RDW: 13.1 % (ref 11.5–15.5)
WBC: 7.4 10*3/uL (ref 4.0–10.5)

## 2012-01-24 LAB — APTT: aPTT: 34 seconds (ref 24–37)

## 2012-01-24 LAB — PROTIME-INR: INR: 1.14 (ref 0.00–1.49)

## 2012-01-24 MED ORDER — CEFAZOLIN SODIUM-DEXTROSE 2-3 GM-% IV SOLR
2.0000 g | INTRAVENOUS | Status: DC
  Filled 2012-01-24: qty 50

## 2012-01-24 NOTE — Consult Note (Signed)
Anesthesia chart review: Patient is an 78 year old male scheduled for revision of right below-knee amputation due to infection (osteomyelitis) on 01/25/2012 by Dr. Marcelino Scot.  PAT visit was on 01/24/12.  History includes nonsmoker, diabetes mellitus type 2 on insulin, hypertension, CKD (Dr. Hinda Lenis), right BKA '07 following a non-healing open right ankle fracture.  PCP is Dr. Wende Neighbors.    EKG on 01/24/12 showed SB with first degree AVB, anterior infarct (age undetermined).  It was not felt significantly changed from his prior EKG from February 2007 by the interpreting cardiologist.  CXR on 01/24/12 showed no active disease.  Labs noted.  K 5.0.  BUN/Cr 38/2.11 (previoulsly 48/2.17 on 01/11/12 and 40/1.90 on 03/24/11).  Glucose is 137.  H/H 12.1/35.7.  PT/PTT WNL.  T&S done.  Patient has known CKD and is already followed by a Nephrologist.  His Cr overall appears stable over the past ten months.  Anticipate he can proceed as planned.  Myra Gianotti, PA-C

## 2012-01-24 NOTE — Pre-Procedure Instructions (Signed)
Riley Griffith  01/24/2012   Your procedure is scheduled on: January 25, 2012.Marland KitchenMarland KitchenTuesday   Report to [facility] at  7:30 AM.  Call this number if you have problems the morning of surgery: [phone]   Remember:   Do not eat food or drink any liquids:After Midnight Monday.    Take these medicines the morning of surgery with A SIP OF WATER: Norvasc, eye drops, flomax   Do not wear jewelry.  Do not wear lotions, powders, or colognes. You may NOT wear deodorant.   Men may shave face and neck.   Do not bring valuables to the hospital.  Contacts, dentures or bridgework may not be worn into surgery.   Leave suitcase in the car. After surgery it may be brought to your room.  For patients admitted to the hospital, checkout time is 11:00 AM the day of discharge.   Patients discharged the day of surgery will not be allowed to drive home.   Name and phone number of your driver:    Special Instructions: Shower using CHG 2 nights before surgery and the night before surgery.  If you shower the day of surgery use CHG.  Use special wash - you have one bottle of CHG for all showers.  You should use approximately 1/3 of the bottle for each shower.   Please read over the following fact sheets that you were given: Pain Booklet, Coughing and Deep Breathing, Blood Transfusion Information, MRSA Information and Surgical Site Infection Prevention

## 2012-01-25 ENCOUNTER — Inpatient Hospital Stay (HOSPITAL_COMMUNITY): Payer: Medicare Other

## 2012-01-25 ENCOUNTER — Encounter (HOSPITAL_COMMUNITY): Payer: Self-pay | Admitting: Vascular Surgery

## 2012-01-25 ENCOUNTER — Inpatient Hospital Stay (HOSPITAL_COMMUNITY)
Admission: RE | Admit: 2012-01-25 | Discharge: 2012-01-27 | DRG: 475 | Disposition: A | Discharge status: Home Health | Payer: Medicare Other | Source: Ambulatory Visit | Attending: Orthopedic Surgery | Admitting: Orthopedic Surgery

## 2012-01-25 ENCOUNTER — Encounter (HOSPITAL_COMMUNITY): Payer: Self-pay | Admitting: *Deleted

## 2012-01-25 ENCOUNTER — Encounter (HOSPITAL_COMMUNITY)
Admission: RE | Disposition: A | Discharge status: Home Health | Payer: Self-pay | Source: Ambulatory Visit | Attending: Orthopedic Surgery

## 2012-01-25 ENCOUNTER — Inpatient Hospital Stay (HOSPITAL_COMMUNITY): Payer: Medicare Other | Admitting: Vascular Surgery

## 2012-01-25 DIAGNOSIS — M869 Osteomyelitis, unspecified: Secondary | ICD-10-CM | POA: Diagnosis present

## 2012-01-25 DIAGNOSIS — I10 Essential (primary) hypertension: Secondary | ICD-10-CM | POA: Diagnosis present

## 2012-01-25 DIAGNOSIS — E119 Type 2 diabetes mellitus without complications: Secondary | ICD-10-CM | POA: Diagnosis present

## 2012-01-25 DIAGNOSIS — S88119A Complete traumatic amputation at level between knee and ankle, unspecified lower leg, initial encounter: Secondary | ICD-10-CM | POA: Diagnosis not present

## 2012-01-25 DIAGNOSIS — Y835 Amputation of limb(s) as the cause of abnormal reaction of the patient, or of later complication, without mention of misadventure at the time of the procedure: Secondary | ICD-10-CM | POA: Diagnosis present

## 2012-01-25 DIAGNOSIS — T8740 Infection of amputation stump, unspecified extremity: Principal | ICD-10-CM | POA: Diagnosis present

## 2012-01-25 DIAGNOSIS — M86669 Other chronic osteomyelitis, unspecified tibia and fibula: Secondary | ICD-10-CM | POA: Diagnosis not present

## 2012-01-25 HISTORY — DX: Gastro-esophageal reflux disease without esophagitis: K21.9

## 2012-01-25 HISTORY — DX: Osteomyelitis, unspecified: M86.9

## 2012-01-25 HISTORY — PX: AMPUTATION: SHX166

## 2012-01-25 HISTORY — PX: APPLICATION OF WOUND VAC: SHX5189

## 2012-01-25 LAB — GLUCOSE, CAPILLARY
Glucose-Capillary: 117 mg/dL — ABNORMAL HIGH (ref 70–99)
Glucose-Capillary: 120 mg/dL — ABNORMAL HIGH (ref 70–99)
Glucose-Capillary: 132 mg/dL — ABNORMAL HIGH (ref 70–99)

## 2012-01-25 SURGERY — AMPUTATION BELOW KNEE
Anesthesia: General | Site: Leg Lower | Laterality: Right | Wound class: Dirty or Infected

## 2012-01-25 MED ORDER — POTASSIUM CHLORIDE IN NACL 20-0.9 MEQ/L-% IV SOLN
INTRAVENOUS | Status: DC
  Administered 2012-01-25 – 2012-01-26 (×2): via INTRAVENOUS
  Filled 2012-01-25 (×3): qty 1000

## 2012-01-25 MED ORDER — CEFAZOLIN SODIUM-DEXTROSE 2-3 GM-% IV SOLR
INTRAVENOUS | Status: DC | PRN
  Administered 2012-01-25: 2 g via INTRAVENOUS

## 2012-01-25 MED ORDER — ONDANSETRON HCL 4 MG/2ML IJ SOLN
4.0000 mg | Freq: Four times a day (QID) | INTRAMUSCULAR | Status: DC | PRN
  Filled 2012-01-25: qty 2

## 2012-01-25 MED ORDER — BIMATOPROST 0.01 % OP SOLN
1.0000 [drp] | Freq: Every day | OPHTHALMIC | Status: DC
Start: 2012-01-25 — End: 2012-01-27
  Administered 2012-01-25 – 2012-01-26 (×2): 1 [drp] via OPHTHALMIC
  Filled 2012-01-25: qty 2.5

## 2012-01-25 MED ORDER — MORPHINE SULFATE 2 MG/ML IJ SOLN: 1.0000 mg | INTRAMUSCULAR | Status: DC | PRN

## 2012-01-25 MED ORDER — INSULIN ASPART 100 UNIT/ML ~~LOC~~ SOLN
4.0000 [IU] | Freq: Three times a day (TID) | SUBCUTANEOUS | Status: DC
  Administered 2012-01-25 – 2012-01-27 (×4): 4 [IU] via SUBCUTANEOUS

## 2012-01-25 MED ORDER — MIDAZOLAM HCL 5 MG/5ML IJ SOLN
INTRAMUSCULAR | Status: DC | PRN
  Administered 2012-01-25: 1 mg via INTRAVENOUS

## 2012-01-25 MED ORDER — PROPOFOL 10 MG/ML IV BOLUS
INTRAVENOUS | Status: DC | PRN
  Administered 2012-01-25: 150 mg via INTRAVENOUS

## 2012-01-25 MED ORDER — ONDANSETRON HCL 4 MG PO TABS: 4.0000 mg | ORAL_TABLET | Freq: Four times a day (QID) | ORAL | Status: DC | PRN

## 2012-01-25 MED ORDER — 0.9 % SODIUM CHLORIDE (POUR BTL) OPTIME
TOPICAL | Status: DC | PRN
  Administered 2012-01-25: 1000 mL

## 2012-01-25 MED ORDER — LACTATED RINGERS IV SOLN
INTRAVENOUS | Status: DC
  Administered 2012-01-25: 09:00:00 via INTRAVENOUS

## 2012-01-25 MED ORDER — METOCLOPRAMIDE HCL 5 MG/ML IJ SOLN: 5.0000 mg | Freq: Three times a day (TID) | INTRAMUSCULAR | Status: DC | PRN

## 2012-01-25 MED ORDER — FERROUS SULFATE 325 (65 FE) MG PO TABS
325.0000 mg | ORAL_TABLET | Freq: Every day | ORAL | Status: DC
  Administered 2012-01-26: 325 mg via ORAL
  Filled 2012-01-25 (×3): qty 1

## 2012-01-25 MED ORDER — VITAMIN D3 25 MCG (1000 UNIT) PO TABS
1000.0000 [IU] | ORAL_TABLET | Freq: Every day | ORAL | Status: DC
  Administered 2012-01-25 – 2012-01-27 (×3): 1000 [IU] via ORAL
  Filled 2012-01-25 (×3): qty 1

## 2012-01-25 MED ORDER — ONDANSETRON HCL 4 MG/2ML IJ SOLN
INTRAMUSCULAR | Status: DC | PRN
  Administered 2012-01-25: 4 mg via INTRAVENOUS

## 2012-01-25 MED ORDER — PHENYLEPHRINE HCL 10 MG/ML IJ SOLN
INTRAMUSCULAR | Status: DC | PRN
  Administered 2012-01-25 (×2): 40 ug via INTRAVENOUS

## 2012-01-25 MED ORDER — DOCUSATE SODIUM 100 MG PO CAPS
100.0000 mg | ORAL_CAPSULE | Freq: Two times a day (BID) | ORAL | Status: DC
  Administered 2012-01-25 – 2012-01-27 (×5): 100 mg via ORAL
  Filled 2012-01-25 (×5): qty 1

## 2012-01-25 MED ORDER — LACTATED RINGERS IV SOLN: INTRAVENOUS | Status: DC

## 2012-01-25 MED ORDER — OXYCODONE HCL 5 MG PO TABS: 5.0000 mg | ORAL_TABLET | ORAL | Status: DC | PRN

## 2012-01-25 MED ORDER — EPHEDRINE SULFATE 50 MG/ML IJ SOLN
INTRAMUSCULAR | Status: DC | PRN
  Administered 2012-01-25 (×2): 10 mg via INTRAVENOUS

## 2012-01-25 MED ORDER — FLUCONAZOLE 200 MG PO TABS
400.0000 mg | ORAL_TABLET | Freq: Every day | ORAL | Status: DC
  Administered 2012-01-25 – 2012-01-27 (×3): 400 mg via ORAL
  Filled 2012-01-25 (×3): qty 2

## 2012-01-25 MED ORDER — INSULIN GLARGINE 100 UNIT/ML ~~LOC~~ SOLN
14.0000 [IU] | Freq: Every day | SUBCUTANEOUS | Status: DC
  Administered 2012-01-25 – 2012-01-27 (×3): 14 [IU] via SUBCUTANEOUS

## 2012-01-25 MED ORDER — ATORVASTATIN CALCIUM 20 MG PO TABS
20.0000 mg | ORAL_TABLET | Freq: Every day | ORAL | Status: DC
  Administered 2012-01-25 – 2012-01-26 (×2): 20 mg via ORAL
  Filled 2012-01-25 (×3): qty 1

## 2012-01-25 MED ORDER — CALCITRIOL 0.25 MCG PO CAPS
0.2500 ug | ORAL_CAPSULE | Freq: Every day | ORAL | Status: DC
  Administered 2012-01-25 – 2012-01-27 (×3): 0.25 ug via ORAL
  Filled 2012-01-25 (×3): qty 1

## 2012-01-25 MED ORDER — AMOXICILLIN-POT CLAVULANATE 875-125 MG PO TABS
1.0000 | ORAL_TABLET | Freq: Two times a day (BID) | ORAL | Status: DC
  Administered 2012-01-25 – 2012-01-27 (×5): 1 via ORAL
  Filled 2012-01-25 (×7): qty 1

## 2012-01-25 MED ORDER — BRIMONIDINE TARTRATE 0.1 % OP SOLN: 1.0000 [drp] | Freq: Two times a day (BID) | OPHTHALMIC | Status: DC

## 2012-01-25 MED ORDER — LACTATED RINGERS IV SOLN
INTRAVENOUS | Status: DC | PRN
  Administered 2012-01-25 (×2): via INTRAVENOUS

## 2012-01-25 MED ORDER — LIDOCAINE HCL (CARDIAC) 10 MG/ML IV SOLN
INTRAVENOUS | Status: DC | PRN
  Administered 2012-01-25: 80 mg via INTRAVENOUS

## 2012-01-25 MED ORDER — HYDROMORPHONE HCL PF 1 MG/ML IJ SOLN: 0.2500 mg | INTRAMUSCULAR | Status: DC | PRN

## 2012-01-25 MED ORDER — AMLODIPINE BESYLATE 10 MG PO TABS
10.0000 mg | ORAL_TABLET | Freq: Two times a day (BID) | ORAL | Status: DC
  Administered 2012-01-25 – 2012-01-26 (×4): 10 mg via ORAL
  Filled 2012-01-25 (×7): qty 1

## 2012-01-25 MED ORDER — METOCLOPRAMIDE HCL 10 MG PO TABS: 5.0000 mg | ORAL_TABLET | Freq: Three times a day (TID) | ORAL | Status: DC | PRN

## 2012-01-25 MED ORDER — BRIMONIDINE TARTRATE 0.2 % OP SOLN
1.0000 [drp] | Freq: Two times a day (BID) | OPHTHALMIC | Status: DC
  Administered 2012-01-25 – 2012-01-27 (×5): 1 [drp] via OPHTHALMIC
  Filled 2012-01-25: qty 5

## 2012-01-25 MED ORDER — ENOXAPARIN SODIUM 40 MG/0.4ML ~~LOC~~ SOLN
40.0000 mg | SUBCUTANEOUS | Status: DC
  Administered 2012-01-26: 40 mg via SUBCUTANEOUS
  Filled 2012-01-25 (×3): qty 0.4

## 2012-01-25 MED ORDER — CHLORHEXIDINE GLUCONATE 4 % EX LIQD: 60.0000 mL | Freq: Once | CUTANEOUS | Status: DC

## 2012-01-25 MED ORDER — FENTANYL CITRATE 0.05 MG/ML IJ SOLN
INTRAMUSCULAR | Status: DC | PRN
  Administered 2012-01-25: 50 ug via INTRAVENOUS
  Administered 2012-01-25: 100 ug via INTRAVENOUS
  Administered 2012-01-25: 50 ug via INTRAVENOUS

## 2012-01-25 MED ORDER — POLYETHYLENE GLYCOL 3350 17 G PO PACK: 17.0000 g | PACK | Freq: Every day | ORAL | Status: DC | PRN

## 2012-01-25 MED ORDER — LISINOPRIL 10 MG PO TABS
10.0000 mg | ORAL_TABLET | Freq: Every day | ORAL | Status: DC
  Administered 2012-01-25 – 2012-01-26 (×2): 10 mg via ORAL
  Filled 2012-01-25 (×3): qty 1

## 2012-01-25 MED ORDER — ACETAMINOPHEN 500 MG PO TABS: 500.0000 mg | ORAL_TABLET | Freq: Four times a day (QID) | ORAL | Status: DC | PRN

## 2012-01-25 MED ORDER — INSULIN ASPART 100 UNIT/ML ~~LOC~~ SOLN
0.0000 [IU] | Freq: Three times a day (TID) | SUBCUTANEOUS | Status: DC
  Administered 2012-01-25: 3 [IU] via SUBCUTANEOUS
  Administered 2012-01-26 (×2): 2 [IU] via SUBCUTANEOUS
  Administered 2012-01-27: 3 [IU] via SUBCUTANEOUS

## 2012-01-25 MED ORDER — VITAMIN C 500 MG PO TABS
500.0000 mg | ORAL_TABLET | Freq: Every day | ORAL | Status: DC
  Administered 2012-01-25 – 2012-01-27 (×3): 500 mg via ORAL
  Filled 2012-01-25 (×3): qty 1

## 2012-01-25 MED ORDER — FUROSEMIDE 20 MG PO TABS
20.0000 mg | ORAL_TABLET | Freq: Every day | ORAL | Status: DC | PRN
  Filled 2012-01-25: qty 1

## 2012-01-25 MED ORDER — TAMSULOSIN HCL 0.4 MG PO CAPS
0.4000 mg | ORAL_CAPSULE | ORAL | Status: DC
  Administered 2012-01-25: 0.4 mg via ORAL

## 2012-01-25 MED ORDER — CEFAZOLIN SODIUM 1-5 GM-% IV SOLN
1.0000 g | Freq: Four times a day (QID) | INTRAVENOUS | Status: AC
  Administered 2012-01-25 – 2012-01-26 (×3): 1 g via INTRAVENOUS
  Filled 2012-01-25 (×4): qty 50

## 2012-01-25 MED ORDER — HYDROCODONE-ACETAMINOPHEN 5-325 MG PO TABS
1.0000 | ORAL_TABLET | ORAL | Status: DC | PRN
  Administered 2012-01-25 – 2012-01-26 (×4): 2 via ORAL
  Administered 2012-01-27: 1 via ORAL
  Filled 2012-01-25: qty 1
  Filled 2012-01-25 (×4): qty 2

## 2012-01-25 SURGICAL SUPPLY — 64 items
BANDAGE ELASTIC 4 VELCRO ST LF (GAUZE/BANDAGES/DRESSINGS) ×3 IMPLANT
BANDAGE ELASTIC 6 VELCRO ST LF (GAUZE/BANDAGES/DRESSINGS) ×3 IMPLANT
BANDAGE ESMARK 6X9 LF (GAUZE/BANDAGES/DRESSINGS) IMPLANT
BANDAGE GAUZE ELAST BULKY 4 IN (GAUZE/BANDAGES/DRESSINGS) ×3 IMPLANT
BLADE SAW SAG 73X25 THK (BLADE) ×1
BLADE SAW SGTL 73X25 THK (BLADE) ×2 IMPLANT
BLADE SURG 10 STRL SS (BLADE) ×3 IMPLANT
BNDG COHESIVE 4X5 TAN STRL (GAUZE/BANDAGES/DRESSINGS) ×3 IMPLANT
BNDG COHESIVE 6X5 TAN STRL LF (GAUZE/BANDAGES/DRESSINGS) IMPLANT
BNDG ESMARK 6X9 LF (GAUZE/BANDAGES/DRESSINGS)
BRUSH SCRUB DISP (MISCELLANEOUS) ×6 IMPLANT
CANISTER SUCTION 2500CC (MISCELLANEOUS) ×3 IMPLANT
CANISTER WOUND CARE 500ML ATS (WOUND CARE) ×3 IMPLANT
CLOTH BEACON ORANGE TIMEOUT ST (SAFETY) ×3 IMPLANT
COVER MAYO STAND STRL (DRAPES) IMPLANT
COVER SURGICAL LIGHT HANDLE (MISCELLANEOUS) ×3 IMPLANT
CUFF TOURNIQUET SINGLE 34IN LL (TOURNIQUET CUFF) ×3 IMPLANT
CUFF TOURNIQUET SINGLE 44IN (TOURNIQUET CUFF) IMPLANT
DRAIN PENROSE 1/2X12 LTX STRL (WOUND CARE) IMPLANT
DRAPE C-ARM 42X72 X-RAY (DRAPES) ×3 IMPLANT
DRAPE EXTREMITY T 121X128X90 (DRAPE) ×3 IMPLANT
DRAPE PROXIMA HALF (DRAPES) ×6 IMPLANT
DRAPE U-SHAPE 47X51 STRL (DRAPES) ×3 IMPLANT
DRSG ADAPTIC 3X8 NADH LF (GAUZE/BANDAGES/DRESSINGS) ×3 IMPLANT
DRSG PAD ABDOMINAL 8X10 ST (GAUZE/BANDAGES/DRESSINGS) ×3 IMPLANT
DRSG VAC ATS MED SENSATRAC (GAUZE/BANDAGES/DRESSINGS) ×6 IMPLANT
EVACUATOR 1/8 PVC DRAIN (DRAIN) IMPLANT
GLOVE BIO SURGEON STRL SZ7.5 (GLOVE) ×3 IMPLANT
GLOVE BIO SURGEON STRL SZ8 (GLOVE) ×3 IMPLANT
GLOVE BIOGEL PI IND STRL 7.5 (GLOVE) ×2 IMPLANT
GLOVE BIOGEL PI IND STRL 8 (GLOVE) ×2 IMPLANT
GLOVE BIOGEL PI INDICATOR 7.5 (GLOVE) ×1
GLOVE BIOGEL PI INDICATOR 8 (GLOVE) ×1
GOWN PREVENTION PLUS XLARGE (GOWN DISPOSABLE) ×3 IMPLANT
GOWN STRL NON-REIN LRG LVL3 (GOWN DISPOSABLE) ×6 IMPLANT
KIT ROOM TURNOVER OR (KITS) ×3 IMPLANT
MANIFOLD NEPTUNE II (INSTRUMENTS) ×3 IMPLANT
NS IRRIG 1000ML POUR BTL (IV SOLUTION) ×3 IMPLANT
PACK GENERAL/GYN (CUSTOM PROCEDURE TRAY) ×3 IMPLANT
PAD ARMBOARD 7.5X6 YLW CONV (MISCELLANEOUS) ×6 IMPLANT
PAD CAST 4YDX4 CTTN HI CHSV (CAST SUPPLIES) ×4 IMPLANT
PADDING CAST COTTON 4X4 STRL (CAST SUPPLIES) ×2
SPONGE GAUZE 4X4 12PLY (GAUZE/BANDAGES/DRESSINGS) ×3 IMPLANT
SPONGE LAP 18X18 X RAY DECT (DISPOSABLE) IMPLANT
STAPLER VISISTAT 35W (STAPLE) IMPLANT
STOCKINETTE IMPERVIOUS LG (DRAPES) IMPLANT
SUT ETHILON 2 0 PSLX (SUTURE) IMPLANT
SUT PDS AB 2-0 CT1 27 (SUTURE) IMPLANT
SUT PROLENE 0 CT 1 30 (SUTURE) ×3 IMPLANT
SUT PROLENE 2 0 CT 1 (SUTURE) IMPLANT
SUT SILK 2 0 (SUTURE)
SUT SILK 2 0 SH CR/8 (SUTURE) IMPLANT
SUT SILK 2-0 18XBRD TIE 12 (SUTURE) IMPLANT
SUT VIC AB 0 CT1 27 (SUTURE)
SUT VIC AB 0 CT1 27XBRD ANBCTR (SUTURE) IMPLANT
SUT VIC AB 2-0 CT1 27 (SUTURE)
SUT VIC AB 2-0 CT1 TAPERPNT 27 (SUTURE) IMPLANT
SUT VICRYL AB 2 0 TIES (SUTURE) IMPLANT
SWAB COLLECTION DEVICE MRSA (MISCELLANEOUS) IMPLANT
TOWEL OR 17X24 6PK STRL BLUE (TOWEL DISPOSABLE) ×3 IMPLANT
TOWEL OR 17X26 10 PK STRL BLUE (TOWEL DISPOSABLE) ×6 IMPLANT
TUBE ANAEROBIC SPECIMEN COL (MISCELLANEOUS) IMPLANT
TUBE CONNECTING 12X1/4 (SUCTIONS) ×3 IMPLANT
WATER STERILE IRR 1000ML POUR (IV SOLUTION) IMPLANT

## 2012-01-25 NOTE — Anesthesia Preprocedure Evaluation (Addendum)
Anesthesia Evaluation  Patient identified by MRN, date of birth, ID band Patient awake    Reviewed: Allergy & Precautions, H&P , NPO status , Patient's Chart, lab work & pertinent test results  History of Anesthesia Complications Negative for: history of anesthetic complications  Airway Mallampati: II      Dental   Pulmonary neg pulmonary ROS,  breath sounds clear to auscultation        Cardiovascular hypertension, Pt. on medications Rate:Normal     Neuro/Psych    GI/Hepatic negative GI ROS, Neg liver ROS,   Endo/Other  diabetes, Type 2  Renal/GU Renal InsufficiencyRenal disease     Musculoskeletal   Abdominal   Peds  Hematology   Anesthesia Other Findings   Reproductive/Obstetrics                         Anesthesia Physical Anesthesia Plan  ASA: III  Anesthesia Plan: General   Post-op Pain Management:    Induction: Intravenous  Airway Management Planned: LMA  Additional Equipment:   Intra-op Plan:   Post-operative Plan: Extubation in OR  Informed Consent: I have reviewed the patients History and Physical, chart, labs and discussed the procedure including the risks, benefits and alternatives for the proposed anesthesia with the patient or authorized representative who has indicated his/her understanding and acceptance.   Dental advisory given  Plan Discussed with: CRNA, Anesthesiologist and Surgeon  Anesthesia Plan Comments:       Anesthesia Quick Evaluation

## 2012-01-25 NOTE — Progress Notes (Signed)
Orthopedic Tech Progress Note Patient Details:  Riley Griffith Nov 19, 1923 [id number]  Patient ID: Riley Griffith, male   DOB: 02/21/1924, 78 y.o.   MRN: [id number]   Irish Elders 01/25/2012, 2:38 PM Trapeze bar

## 2012-01-25 NOTE — Brief Op Note (Signed)
01/25/2012  11:50 AM  PATIENT:  Riley Griffith  78 y.o. male  PRE-OPERATIVE DIAGNOSIS:  RIGHT BELOW KNEE AMPUTATION OSTEOMYLITIS   POST-OPERATIVE DIAGNOSIS:  RIGHT BELOW KNEE AMPUTATION OSTEOMYLITIS   PROCEDURE:  Procedure(s) (LRB) with comments: AMPUTATION BELOW KNEE (Right) - REVISION OF BELOW KNEE  AMPUTATION  APPLICATION OF WOUND VAC (N/A) - AND PLACEMENT OF WOUND VAC   SURGEON:  Surgeon(s) and Role:    * Rozanna Box, MD - Primary  ANESTHESIA:   general  EBL:  Total I/O In: 1000 [I.V.:1000] Out: -   BLOOD ADMINISTERED: None  DRAINS: wound vac   LOCAL MEDICATIONS USED:  NONE  SPECIMEN:  Source of Specimen:  two; infection site (1) bone and (2) soft tissue  DISPOSITION OF SPECIMEN:  micro  COUNTS:  YES  TOURNIQUET:  * Missing tourniquet times found for documented tourniquets in log:  [id number] *  DICTATION: .Other Dictation: Dictation Number [id number]  Griffith OF CARE: Admit to inpatient   PATIENT DISPOSITION:  PACU - hemodynamically stable.   Delay start of Pharmacological VTE agent (>24hrs) due to surgical blood loss or risk of bleeding: no

## 2012-01-25 NOTE — Progress Notes (Signed)
UR COMPLETED  

## 2012-01-25 NOTE — Transfer of Care (Signed)
Immediate Anesthesia Transfer of Care Note  Patient: Riley Griffith  Procedure(s) Performed: Procedure(s) (LRB) with comments: AMPUTATION BELOW KNEE (Right) - REVISION OF BELOW KNEE  AMPUTATION  APPLICATION OF WOUND VAC (N/A) - AND PLACEMENT OF WOUND VAC   Patient Location: PACU  Anesthesia Type: General  Level of Consciousness: awake, sedated and patient cooperative  Airway & Oxygen Therapy: Patient Spontanous Breathing and Patient connected to nasal cannula oxygen  Post-op Assessment: Report given to PACU RN, Post -op Vital signs reviewed and stable and Patient moving all extremities  Post vital signs: Reviewed and stable  Complications: No apparent anesthesia complications

## 2012-01-25 NOTE — Anesthesia Postprocedure Evaluation (Signed)
  Anesthesia Post-op Note  Patient: Riley Griffith  Procedure(s) Performed: Procedure(s) (LRB) with comments: AMPUTATION BELOW KNEE (Right) - REVISION OF BELOW KNEE  AMPUTATION  APPLICATION OF WOUND VAC (N/A) - AND PLACEMENT OF WOUND VAC   Patient Location: PACU  Anesthesia Type: General  Level of Consciousness: awake  Airway and Oxygen Therapy: Patient Spontanous Breathing  Post-op Pain: mild  Post-op Assessment: Post-op Vital signs reviewed  Post-op Vital Signs: Reviewed  Complications: No apparent anesthesia complications

## 2012-01-25 NOTE — H&P (Signed)
Riley Griffith is an 78 y.o. male.   Chief Complaint: infected BKA HPI: 78 yo w recurrent bursa, eventual infection  Past Medical History  Diagnosis Date  . Diabetes mellitus   . Hypertension   . Renal disorder     see Dr Tawni Carnes    Past Surgical History  Procedure Date  . Leg amputation below knee     rt knee    History reviewed. No pertinent family history. Social History:  reports that he has never smoked. He does not have any smokeless tobacco history on file. He reports that he does not drink alcohol or use illicit drugs.  Allergies: No Known Allergies  Medications Prior to Admission  Medication Sig Dispense Refill  . acetaminophen (TYLENOL) 500 MG tablet Take 500 mg by mouth every 6 (six) hours as needed.      Marland Kitchen amLODipine (NORVASC) 10 MG tablet Take 10 mg by mouth 2 (two) times daily.       Marland Kitchen amoxicillin-clavulanate (AUGMENTIN) 875-125 MG per tablet Take 1 tablet by mouth 2 (two) times daily.  60 tablet  1  . aspirin 81 MG chewable tablet Chew 81 mg by mouth daily.      . bimatoprost (LUMIGAN) 0.01 % SOLN Place 1 drop into the left eye at bedtime.      . brimonidine (ALPHAGAN P) 0.1 % SOLN Place 1 drop into the left eye 2 (two) times daily.      . calcitRIOL (ROCALTROL) 0.25 MCG capsule Take 0.25 mcg by mouth daily.      . cholecalciferol (VITAMIN D) 1000 UNITS tablet Take 1,000 Units by mouth daily.        Marland Kitchen co-enzyme Q-10 30 MG capsule Take 30 mg by mouth daily.        . ferrous sulfate 325 (65 FE) MG tablet Take 325 mg by mouth daily with breakfast.      . fish oil-omega-3 fatty acids 1000 MG capsule Take 2 g by mouth daily.      . fluconazole (DIFLUCAN) 200 MG tablet Take 2 tablets (400 mg total) by mouth daily.  60 tablet  6  . furosemide (LASIX) 20 MG tablet Take 20 mg by mouth daily as needed. For swelling      . insulin glargine (LANTUS) 100 UNIT/ML injection Inject 14 Units into the skin daily.      Marland Kitchen lisinopril (PRINIVIL,ZESTRIL) 10 MG tablet Take 10 mg by  mouth daily.        . rosuvastatin (CRESTOR) 10 MG tablet Take 10 mg by mouth daily.       . Tamsulosin HCl (FLOMAX) 0.4 MG CAPS Take 0.4 mg by mouth daily after supper.        . vitamin C (ASCORBIC ACID) 500 MG tablet Take 500 mg by mouth daily.          Results for orders placed during the hospital encounter of 01/25/12 (from the past 48 hour(s))  GLUCOSE, CAPILLARY     Status: Abnormal   Collection Time   01/25/12  7:21 AM      Component Value Range Comment   Glucose-Capillary 117 (*) 70 - 99 mg/dL    Dg Chest 2 View  01/24/2012  *RADIOLOGY REPORT*  Clinical Data: Preop, Below-knee amputation  CHEST - 2 VIEW  Comparison: 03/24/2011  Findings: Cardiomediastinal silhouette is unremarkable.  No acute infiltrate or pulmonary edema.  Mild hyperinflation.  Stable mild compression deformity mid thoracic spine.  IMPRESSION: No active disease.  Original Report Authenticated By: Lahoma Crocker, M.D.     Blood pressure 124/68, pulse 58, temperature 97.5 F (36.4 C), temperature source Oral, resp. rate 18, SpO2 100.00%.  RRR CTA bilat S/NT/ND Infected bursa, open wound  Assessment/Plan Infected BKA for revision secondary to infection  Altamese Westmorland, MD [facility] [phone] (p)

## 2012-01-26 ENCOUNTER — Encounter (HOSPITAL_COMMUNITY): Payer: Self-pay | Admitting: General Practice

## 2012-01-26 LAB — GLUCOSE, CAPILLARY
Glucose-Capillary: 98 mg/dL (ref 70–99)
Glucose-Capillary: 136 mg/dL — ABNORMAL HIGH (ref 70–99)
Glucose-Capillary: 148 mg/dL — ABNORMAL HIGH (ref 70–99)

## 2012-01-26 NOTE — Progress Notes (Signed)
PT PROGRESS NOTE (DME Wheel Chair needs)   01/26/12 1500  PT Visit Information  Last PT Received On 01/26/12  Assistance Needed +1  PT Time Calculation  PT Start Time 1452  PT Stop Time 1502  PT Time Calculation (min) 10 min  PT - Assessment/Plan  Comments on Treatment Session Met with patient and spouse to obtain measurements for w/c.  Patient will need the following: Breezy Ultra 4; 16x18; drop arm; with amputee pad; and foam cushion.  Equipment Recommended Wheelchair (measurements) (See Assessment for w/c needs)  PT General Charges  $$ ACUTE PT VISIT 1 Procedure  PT Treatments  $Self Care/Home Management 8-22    Alben Deeds, [place] DPT  [phone]

## 2012-01-26 NOTE — Progress Notes (Signed)
I agree with the following treatment note after reviewing documentation.   Johnston, Deaun Rocha Brynn   OTR/L Pager: [phone] Office: [phone] .   

## 2012-01-26 NOTE — Progress Notes (Addendum)
CARE MANAGEMENT NOTE 01/26/2012  Patient:  Riley Griffith, Riley Griffith   Account Number:  [id number]  Date Initiated:  01/26/2012  Documentation initiated by:  Ricki Miller  Subjective/Objective Assessment:   78 yr old male s/p revision right BKA     Action/Plan:   CM spoke with patient, wife and daughter regarding home health and DME needs. Patient is active with [facility] per wife. no changes.   Anticipated DC Date:  01/29/2012   Anticipated DC Plan:  [place] [facility]  CM consult      [facility] Choice  HOME HEALTH  DURABLE MEDICAL EQUIPMENT   Choice offered to / List presented to:  C-3 Spouse   DME arranged  [place]      DME agency  [place] arranged  HH-1 RN  HH-2 PT      [facility] agency  [place]   Status of service:  In process Medicare Important Message given?   (If response is "NO", the following Medicare IM given date fields will be blank) Date Medicare IM given:   Date Additional Medicare IM given:    Discharge Disposition:  [place]  Per UR Regulation:    If discussed at Long Length of Stay Meetings, dates discussed:    Comments:

## 2012-01-26 NOTE — Evaluation (Signed)
Physical Therapy Evaluation Patient Details Name: Riley Griffith MRN: [id number] DOB: [birth year] Today's Date: 01/26/2012 Time: 0829-0900 PT Time Calculation (min): 31 min  PT Assessment / Plan / Recommendation Clinical Impression  Pt is a pleasent and cooperative 78 y.o. male s/p excision and modification of residual limb right lower extremity BKA.  Patient instructed on ther-ex to promote ROM and strengthening of residual limb.  Patient demonstrated good functional mobility for transfers to w/c and recliner requireing no cues or assistance.  Discussed with patient need to modifications to wc as the brakes' on pt's chair are not engaging properly despite adjustments contstantly made by patient.  Feel patient will benefit from continues skilled PT to improve strength, ROM, and activity tolerance in preparation for discharge. Rec continued home PT after discharge.    PT Assessment  Patient needs continued PT services    Follow Up Recommendations  Home health PT    Barriers to Discharge        Equipment Recommendations  Wheelchair (measurements) (patients current wheel chair is unsafe)    Recommendations for Other Services     Frequency Min 5X/week    Precautions / Restrictions Precautions Precautions: Fall Precaution Comments: wound vac Restrictions Weight Bearing Restrictions: Yes RLE Weight Bearing: Non weight bearing   Pertinent Vitals/Pain 3/10      Mobility  Bed Mobility Bed Mobility: Supine to Sit;Sitting - Scoot to Edge of Bed Supine to Sit: 5: Supervision Sitting - Scoot to [employer] of Bed: 5: Supervision Transfers Transfers: Programmer, applications Transfers: 5: Supervision;With armrests (performed successfully from bed to wc; from wc to recliner) Details for Transfer Assistance: Pt able to position wc and stand pivot without VC's or instructions needed Ambulation/Gait Ambulation/Gait Assistance: Not tested (comment) (pt will be using wc for mobility  ) Wheelchair Mobility Wheelchair Mobility: Yes Wheelchair Assistance: 5: Supervision Wheelchair Assistance Details (indicate cue type and reason): no assist required       Exercises Amputee Exercises Quad Sets: Strengthening;Right;10 reps Knee Flexion: AROM;Right;10 reps Knee Extension: AROM;Right;10 reps Straight Leg Raises: Strengthening;Right;10 reps   PT Diagnosis: Acute pain;Generalized weakness  PT Problem List: Decreased strength;Decreased range of motion;Decreased activity tolerance;Decreased mobility;Pain PT Treatment Interventions: Functional mobility training;Therapeutic activities;Therapeutic exercise;Patient/family education;Wheelchair mobility training;Manual techniques   PT Goals Acute Rehab PT Goals PT Goal Formulation: With patient Time For Goal Achievement: 01/31/12 Potential to Achieve Goals: Good Pt will Transfer Bed to Chair/Chair to Bed: with modified independence PT Transfer Goal: Bed to Chair/Chair to Bed - Progress: Goal set today Pt will Perform Home Exercise Program: Independently PT Goal: Perform Home Exercise Program - Progress: Goal set today Pt will Propel Wheelchair: with modified independence PT Goal: Propel Wheelchair - Progress: Goal set today  Visit Information  Last PT Received On: 01/26/12 Assistance Needed: +1    Subjective Data  Subjective: There is not much pain Patient Stated Goal: to go home   Prior [place] Lives With: Spouse Available Help at Discharge: Family Type of Home: House Home Access: Ramped entrance Home Layout: One level Bathroom Shower/Tub: Multimedia programmer: Handicapped height Bathroom Accessibility: Yes How Accessible: Accessible via wheelchair R[place]in: Wheelchair - manual;Walker - rolling;Quad cane Prior Function Level of Independence: Independent (intermittantly uses can when community ambulating with prost) Able to Take Stairs?: Yes Driving: Yes Vocation:  Retired Corporate investment banker: No difficulties Dominant Hand: Right    Cognition  Overall Cognitive Status: Appears within functional limits for tasks assessed/performed Arousal/Alertness: Awake/alert Orientation Level:  Appears intact for tasks assessed;Oriented X4 / Intact Behavior During Session: [facility] for tasks performed    Extremity/Trunk Assessment Right Upper Extremity Assessment RUE ROM/Strength/Tone: [facility] for tasks assessed Left Upper Extremity Assessment LUE ROM/Strength/Tone: [facility] for tasks assessed Right Lower Extremity Assessment RLE ROM/Strength/Tone: Deficits;Unable to fully assess RLE ROM/Strength/Tone Deficits:   RLE Sensation:  (reports hypersensitivity in medial border of femoral condyle) Left Lower Extremity Assessment LLE ROM/Strength/Tone: WFL for tasks assessed       End of Session PT - End of Session Activity Tolerance: Patient tolerated treatment well Patient left: in chair;with call bell/phone within reach;with family/visitor present Nurse Communication: Mobility status  GP     Duncan Dull 01/26/2012, 9:20 AM Alben Deeds, PT DPT  [phone]

## 2012-01-26 NOTE — Op Note (Signed)
NAME:  Riley Griffith, Riley Griffith NO.:  [id number]  MEDICAL RECORD NO.:  [id number]  LOCATION:  [id number]                        FACILITY:  [facility]  PHYSICIAN:  Astrid Divine. Marcelino Scot, M.D. DATE OF BIRTH:  1923-12-06  DATE OF PROCEDURE:  01/25/2012 DATE OF DISCHARGE:                              OPERATIVE REPORT   PREOPERATIVE DIAGNOSIS:  Right infected below-knee amputation stump.  POSTOPERATIVE DIAGNOSIS:  Right infected below-knee amputation stump.  PROCEDURE: 1. Excision of osteomyelitis and revision right below-knee amputation. 2. Application of wound VAC.  SURGEON:  Astrid Divine. Marcelino Scot, M.D.  ASSISTANT:  None.  ANESTHESIA:  General.  COMPLICATIONS:  None.  ESTIMATED BLOOD LOSS:  110 mL.  SPECIMENS:  Two bone from the infected distal stump and 2 soft tissue from the infected distal stump.  DISPOSITION:  Specimens to Microbiology.  COUNTS:  Correct.  TOURNIQUET:  None.  DISPOSITION:  To PACU.  CONDITION:  Stable.  BRIEF SUMMARY AND INDICATION FOR PROCEDURE:  [name] is an 78 year old male, who has had a long-standing prominence of his below-knee amputation.  I have offered to excise this prominence over several years with the patient deferring instead to nonsurgical treatment with adjustment of his below-the-knee amputation prosthesis.  He did develop a septic bursitis and wished to undergo definitive excision and repair in a staged fashion.  He did see Infectious Disease preoperatively as well.  He understood the risks to include, persistent infection, need for further surgery, DVT, PE, and multiple others including heart attack and stroke.  BRIEF SUMMARY OF PROCEDURE:  Ms. Monigold was given preop antibiotics. His cultures are been obtained.  He was taken to the operating room where general anesthesia was induced.  His right lower extremity was prepped and draped in usual sterile fashion.  Tourniquet was placed about the thigh, but never inflated during  the case.  We made the old fishmouth incision, which because of the anterior prominence had moved somewhat to a posterior midline position.  The abscess was not dissected, but instead excised in bulk.  A separate anterior incision anterior to the area of involvement was made.  Dissection carried down to the tibia where the distal 2 cm using C-arm for reference were resected along with the distal 2 cm of the fibula.  This then allowed [place] to pass off and 1 specimen in the entire area of infection.  I did place 2 Prolene sutures, 1 around the popliteal and then 1 in the large posterior vein within the muscle of the gastroc.  The specimen was cut into a bone and soft tissue section such that it could fit within the specimen cups and then sent to the lab.  The bed was irrigated thoroughly.  I did bevel the anterior cortex of the tibia and, and then a deep wound VAC placed.  The patient underwent application of a sterile dressing and was taken to the PACU in stable condition.  PROGNOSIS:  Mr. Cohea will return in 2 days for definitive closure and he will continue on IV antibiotics under the direction of Infectious Disease.  We did not encounter any purulence, and I do feel the entire infection was surgically excised.  Astrid Divine. Marcelino Scot, M.D.     MHH/MEDQ  D:  01/25/2012  T:  01/26/2012  Job:  [id number]

## 2012-01-26 NOTE — Progress Notes (Signed)
Occupational Therapy Evaluation Patient Details Name: Riley Griffith MRN: [id number] DOB: [birth year] Today's Date: 01/26/2012 Time: [id number] OT Time Calculation (min): 31 min  OT Assessment / Plan / Recommendation Clinical Impression  Pt. 78 yo male s/p BKA revision due to infection. Pt. and spouse educated on desensitization techniques and RLE positioning. Pt. able to demonstrate transfers with supervison. Wife expressed concern with wrapping LE, OT will address compression wrapping tomorrow 01/27/2012. Gave pt. handout on compression wrapping to read over.      OT Assessment  Patient needs continued OT Services    Follow Up Recommendations  Home health OT    Barriers to Discharge      Equipment Recommendations  Wheelchair (measurements) (brakes do not work well )    Recommendations for Other Services    Frequency  Min 2X/week    Precautions / Restrictions Precautions Precautions: Fall Precaution Comments: wound vac Restrictions Weight Bearing Restrictions: Yes RLE Weight Bearing: Non weight bearing   Pertinent Vitals/Pain None reported    ADL  Grooming: Performed;Modified independent Where Assessed - Grooming: Supported sitting Toilet Transfer: Chartered loss adjuster Method: Arts development officer: Regular height toilet;Grab bars Toileting - Water quality scientist and Hygiene: Performed;Independent Where Assessed - Toileting Clothing Manipulation and Hygiene: Sit to stand from 3-in-1 or toilet Transfers/Ambulation Related to ADLs: Pt. transfers stand pivot with supervision for safety. Pt. uses wheelchair for mobility.   ADL Comments: Pt. educated on desenistization of BKA. Wife and pt. educated to check dressing every 4 hours to be sure that it is tight, and to notify nursing if it needs to be re-wrapped. Pt. completed stand pivot transfer from chair to wheel chair and wheelchair to toilet with supervision for safety. Pt. also educated  on positioning of RLE.     OT Diagnosis: Generalized weakness  OT Problem List: Decreased knowledge of precautions;Decreased activity tolerance OT Treatment Interventions: Self-care/ADL training;Therapeutic exercise;Patient/family education   OT Goals Acute Rehab OT Goals OT Goal Formulation: With patient Time For Goal Achievement: 02/09/12 Potential to Achieve Goals: Good Miscellaneous OT Goals Miscellaneous OT Goal #1: Pt. will demonstrate proper technique for compression wrapping of RLE in order to decrease swelling and shape residual limb.  OT Goal: Miscellaneous Goal #1 - Progress: Goal set today  Visit Information  Last OT Received On: 01/26/12 Assistance Needed: +1    Subjective Data  Subjective: I don't really remember what they taught me about my leg, it was 6 years ago. Patient Stated Goal: To go home   Prior [place] Lives With: Spouse Available Help at Discharge: Family Type of Home: House Home Access: Ramped entrance Home Layout: One level Bathroom Shower/Tub: Multimedia programmer: Handicapped height Bathroom Accessibility: Yes How Accessible: Accessible via wheelchair [place]: Wheelchair - manual;Walker - rolling;Quad cane Prior Function Level of Independence: Independent Able to Take Stairs?: Yes Driving: Yes Vocation: Retired Corporate investment banker: No difficulties Dominant Hand: Right         Vision/Perception     Cognition  Overall Cognitive Status: Appears within functional limits for tasks assessed/performed Arousal/Alertness: Awake/alert Orientation Level: Appears intact for tasks assessed;Oriented X4 / Intact Behavior During Session: [facility] for tasks performed    Extremity/Trunk Assessment Right Upper Extremity Assessment RUE ROM/Strength/Tone: [facility] for tasks assessed Left Upper Extremity Assessment LUE ROM/Strength/Tone: WFL for tasks assessed     Mobility Bed Mobility Bed Mobility:  Not assessed Transfers Details for Transfer Assistance: Pt able to position wc and stand pivot  without VC's or instructions needed                    End of Session OT - End of Session Activity Tolerance: Patient tolerated treatment well Patient left: in chair;with call bell/phone within reach;with family/visitor present Nurse Communication: Mobility status  GO     Sharyn Creamer 01/26/2012, 1:32 PM

## 2012-01-27 ENCOUNTER — Encounter (HOSPITAL_COMMUNITY)
Admission: RE | Disposition: A | Discharge status: Home Health | Payer: Self-pay | Source: Ambulatory Visit | Attending: Orthopedic Surgery

## 2012-01-27 ENCOUNTER — Inpatient Hospital Stay (HOSPITAL_COMMUNITY): Admission: RE | Admit: 2012-01-27 | Payer: Medicare Other | Source: Ambulatory Visit | Admitting: Orthopedic Surgery

## 2012-01-27 ENCOUNTER — Inpatient Hospital Stay (HOSPITAL_COMMUNITY): Payer: Medicare Other | Admitting: Certified Registered Nurse Anesthetist

## 2012-01-27 ENCOUNTER — Encounter (HOSPITAL_COMMUNITY): Payer: Self-pay | Admitting: Certified Registered Nurse Anesthetist

## 2012-01-27 DIAGNOSIS — M869 Osteomyelitis, unspecified: Secondary | ICD-10-CM | POA: Diagnosis not present

## 2012-01-27 DIAGNOSIS — I10 Essential (primary) hypertension: Secondary | ICD-10-CM | POA: Diagnosis not present

## 2012-01-27 DIAGNOSIS — M86669 Other chronic osteomyelitis, unspecified tibia and fibula: Secondary | ICD-10-CM | POA: Diagnosis not present

## 2012-01-27 DIAGNOSIS — T8740 Infection of amputation stump, unspecified extremity: Secondary | ICD-10-CM | POA: Diagnosis not present

## 2012-01-27 DIAGNOSIS — E119 Type 2 diabetes mellitus without complications: Secondary | ICD-10-CM | POA: Diagnosis not present

## 2012-01-27 LAB — GLUCOSE, CAPILLARY
Glucose-Capillary: 123 mg/dL — ABNORMAL HIGH (ref 70–99)
Glucose-Capillary: 154 mg/dL — ABNORMAL HIGH (ref 70–99)
Glucose-Capillary: 98 mg/dL (ref 70–99)

## 2012-01-27 SURGERY — SECONDARY CLOSURE OF WOUND
Anesthesia: General | Site: Knee | Laterality: Right | Wound class: Clean

## 2012-01-27 MED ORDER — LACTATED RINGERS IV SOLN
INTRAVENOUS | Status: DC | PRN
  Administered 2012-01-27: 07:00:00 via INTRAVENOUS

## 2012-01-27 MED ORDER — FLUCONAZOLE 200 MG PO TABS: 400.0000 mg | ORAL_TABLET | Freq: Every day | ORAL | Status: DC

## 2012-01-27 MED ORDER — HYDROMORPHONE HCL PF 1 MG/ML IJ SOLN: 0.2500 mg | INTRAMUSCULAR | Status: DC | PRN

## 2012-01-27 MED ORDER — AMOXICILLIN-POT CLAVULANATE 875-125 MG PO TABS: 1.0000 | ORAL_TABLET | Freq: Two times a day (BID) | ORAL | Status: DC

## 2012-01-27 MED ORDER — OXYCODONE HCL 5 MG/5ML PO SOLN: 5.0000 mg | Freq: Once | ORAL | Status: DC | PRN

## 2012-01-27 MED ORDER — OXYCODONE HCL 5 MG PO TABS: 5.0000 mg | ORAL_TABLET | Freq: Once | ORAL | Status: DC | PRN

## 2012-01-27 MED ORDER — SODIUM CHLORIDE 0.9 % IR SOLN
Status: DC | PRN
  Administered 2012-01-27: 1000 mL

## 2012-01-27 MED ORDER — ONDANSETRON HCL 4 MG/2ML IJ SOLN
INTRAMUSCULAR | Status: DC | PRN
  Administered 2012-01-27: 4 mg via INTRAVENOUS

## 2012-01-27 MED ORDER — PROMETHAZINE HCL 25 MG/ML IJ SOLN: 6.2500 mg | INTRAMUSCULAR | Status: DC | PRN

## 2012-01-27 MED ORDER — FENTANYL CITRATE 0.05 MG/ML IJ SOLN
INTRAMUSCULAR | Status: DC | PRN
  Administered 2012-01-27: 25 ug via INTRAVENOUS

## 2012-01-27 MED ORDER — EPHEDRINE SULFATE 50 MG/ML IJ SOLN
INTRAMUSCULAR | Status: DC | PRN
  Administered 2012-01-27 (×5): 5 mg via INTRAVENOUS

## 2012-01-27 MED ORDER — MEPERIDINE HCL 25 MG/ML IJ SOLN: 6.2500 mg | INTRAMUSCULAR | Status: DC | PRN

## 2012-01-27 MED ORDER — LIDOCAINE HCL (CARDIAC) 20 MG/ML IV SOLN
INTRAVENOUS | Status: DC | PRN
  Administered 2012-01-27: 100 mg via INTRAVENOUS

## 2012-01-27 MED ORDER — PROPOFOL 10 MG/ML IV BOLUS
INTRAVENOUS | Status: DC | PRN
  Administered 2012-01-27: 150 mg via INTRAVENOUS

## 2012-01-27 MED ORDER — HYDROCODONE-ACETAMINOPHEN 5-325 MG PO TABS: 1.0000 | ORAL_TABLET | Freq: Four times a day (QID) | ORAL | Status: DC | PRN

## 2012-01-27 SURGICAL SUPPLY — 48 items
BANDAGE GAUZE ELAST BULKY 4 IN (GAUZE/BANDAGES/DRESSINGS) ×2 IMPLANT
BLADE SURG 10 STRL SS (BLADE) ×2 IMPLANT
BNDG COHESIVE 4X5 TAN STRL (GAUZE/BANDAGES/DRESSINGS) IMPLANT
BNDG GAUZE STRTCH 6 (GAUZE/BANDAGES/DRESSINGS) IMPLANT
BRUSH SCRUB DISP (MISCELLANEOUS) ×4 IMPLANT
CLOTH BEACON ORANGE TIMEOUT ST (SAFETY) ×2 IMPLANT
COVER SURGICAL LIGHT HANDLE (MISCELLANEOUS) ×4 IMPLANT
DRAIN PENROSE 1/4X12 LTX STRL (WOUND CARE) ×2 IMPLANT
DRAPE C-ARMOR (DRAPES) IMPLANT
DRAPE PROXIMA HALF (DRAPES) ×2 IMPLANT
DRAPE U-SHAPE 47X51 STRL (DRAPES) ×2 IMPLANT
DRAPE U-SHAPE 76X120 STRL (DRAPES) ×4 IMPLANT
DRSG ADAPTIC 3X8 NADH LF (GAUZE/BANDAGES/DRESSINGS) ×2 IMPLANT
ELECT CAUTERY BLADE 6.4 (BLADE) IMPLANT
ELECT REM PT RETURN 9FT ADLT (ELECTROSURGICAL)
ELECTRODE REM PT RTRN 9FT ADLT (ELECTROSURGICAL) IMPLANT
GLOVE BIO SURGEON STRL SZ7.5 (GLOVE) IMPLANT
GLOVE BIO SURGEON STRL SZ8 (GLOVE) ×2 IMPLANT
GLOVE BIOGEL PI IND STRL 7.5 (GLOVE) IMPLANT
GLOVE BIOGEL PI IND STRL 8 (GLOVE) ×1 IMPLANT
GLOVE BIOGEL PI INDICATOR 7.5 (GLOVE)
GLOVE BIOGEL PI INDICATOR 8 (GLOVE) ×1
GOWN PREVENTION PLUS XLARGE (GOWN DISPOSABLE) ×2 IMPLANT
GOWN STRL NON-REIN LRG LVL3 (GOWN DISPOSABLE) ×4 IMPLANT
HANDPIECE INTERPULSE COAX TIP (DISPOSABLE)
KIT BASIN OR (CUSTOM PROCEDURE TRAY) ×2 IMPLANT
KIT ROOM TURNOVER OR (KITS) ×2 IMPLANT
MANIFOLD NEPTUNE II (INSTRUMENTS) ×2 IMPLANT
NS IRRIG 1000ML POUR BTL (IV SOLUTION) ×2 IMPLANT
PACK GENERAL/GYN (CUSTOM PROCEDURE TRAY) ×2 IMPLANT
PACK ORTHO EXTREMITY (CUSTOM PROCEDURE TRAY) IMPLANT
PAD ARMBOARD 7.5X6 YLW CONV (MISCELLANEOUS) ×4 IMPLANT
PADDING CAST COTTON 6X4 STRL (CAST SUPPLIES) IMPLANT
SET HNDPC FAN SPRY TIP SCT (DISPOSABLE) IMPLANT
SPONGE GAUZE 4X4 12PLY (GAUZE/BANDAGES/DRESSINGS) ×2 IMPLANT
SPONGE LAP 18X18 X RAY DECT (DISPOSABLE) IMPLANT
STOCKINETTE IMPERVIOUS 9X36 MD (GAUZE/BANDAGES/DRESSINGS) IMPLANT
SUT ETHILON 3 0 FSL (SUTURE) ×2 IMPLANT
SUT PDS AB 0 CT 36 (SUTURE) ×2 IMPLANT
SUT PDS AB 2-0 CT1 27 (SUTURE) ×2 IMPLANT
SUT PROLENE 2 0 CT2 30 (SUTURE) ×2 IMPLANT
TOWEL OR 17X24 6PK STRL BLUE (TOWEL DISPOSABLE) ×2 IMPLANT
TOWEL OR 17X26 10 PK STRL BLUE (TOWEL DISPOSABLE) ×4 IMPLANT
TUBE ANAEROBIC SPECIMEN COL (MISCELLANEOUS) IMPLANT
TUBE CONNECTING 12X1/4 (SUCTIONS) IMPLANT
UNDERPAD 30X30 INCONTINENT (UNDERPADS AND DIAPERS) ×2 IMPLANT
WATER STERILE IRR 1000ML POUR (IV SOLUTION) ×2 IMPLANT
YANKAUER SUCT BULB TIP NO VENT (SUCTIONS) IMPLANT

## 2012-01-27 NOTE — Anesthesia Preprocedure Evaluation (Signed)
Anesthesia Evaluation  Patient identified by MRN, date of birth, ID band Patient awake    Reviewed: Allergy & Precautions, H&P , NPO status , Patient's Chart, lab work & pertinent test results  Airway Mallampati: II  Neck ROM: Full    Dental  (+) Poor Dentition, Missing and Dental Advisory Given   Pulmonary neg pulmonary ROS,  breath sounds clear to auscultation        Cardiovascular hypertension, + Peripheral Vascular Disease Rhythm:Regular Rate:Normal     Neuro/Psych negative neurological ROS     GI/Hepatic GERD-  ,  Endo/Other  diabetes  Renal/GU Renal disease     Musculoskeletal   Abdominal   Peds  Hematology   Anesthesia Other Findings   Reproductive/Obstetrics                           Anesthesia Physical Anesthesia Plan  ASA: III  Anesthesia Plan: General   Post-op Pain Management:    Induction: Intravenous  Airway Management Planned: LMA  Additional Equipment:   Intra-op Plan:   Post-operative Plan: Extubation in OR  Informed Consent: I have reviewed the patients History and Physical, chart, labs and discussed the procedure including the risks, benefits and alternatives for the proposed anesthesia with the patient or authorized representative who has indicated his/her understanding and acceptance.   Dental advisory given  Plan Discussed with: CRNA and Surgeon  Anesthesia Plan Comments:         Anesthesia Quick Evaluation

## 2012-01-27 NOTE — Progress Notes (Signed)
Comfortable sitting in chair VSS RLE clean, dry, intact  Continue IV ABx OR tom for vac removal and closure over drain. Anticipate d/c to home afterward.  Altamese Frontier, MD [facility] [phone] (p)

## 2012-01-27 NOTE — Anesthesia Procedure Notes (Signed)
Procedure Name: LMA Insertion Date/Time: 01/27/2012 8:10 AM Performed by: [place] Pink Pre-anesthesia Checklist: Patient identified, Timeout performed, Emergency Drugs available, Suction available and Patient being monitored Patient Re-evaluated:Patient Re-evaluated prior to inductionOxygen Delivery Method: Circle system utilized Preoxygenation: Pre-oxygenation with 100% oxygen Intubation Type: IV induction LMA: LMA with gastric port inserted LMA Size: 5.0 Number of attempts: 1 Placement Confirmation: positive ETCO2 and breath sounds checked- equal and bilateral Tube secured with: Tape Dental Injury: Teeth and Oropharynx as per pre-operative assessment

## 2012-01-27 NOTE — Progress Notes (Signed)
OT Cancellation Note  Treatment cancelled today due to patient receiving procedure or test (currently in OR). OT will reattempt in the PM hours.  Sharol Harness [place] Pager: [phone]  01/27/2012, 8:59 AM

## 2012-01-27 NOTE — Progress Notes (Signed)
PT Cancellation Note  Treatment cancelled today due to patient receiving procedure or test; patient currently in OR.  Duncan Dull 01/27/2012, 8:54 AM Alben Deeds, PT DPT  [phone]

## 2012-01-27 NOTE — Care Management Note (Signed)
    Page 1 of 2   01/27/2012     1:53:59 PM   CARE MANAGEMENT NOTE 01/27/2012  Patient:  Riley Griffith, Riley Griffith   Account Number:  [id number]  Date Initiated:  01/26/2012  Documentation initiated by:  Ricki Miller  Subjective/Objective Assessment:   78 yr old male s/p revision right BKA     Action/Plan:   CM spoke with patient, wife and daughter regarding home health and DME needs. Patient is active with [facility] per wife. no changes.   Anticipated DC Date:  01/28/2012   Anticipated DC Plan:  [place] [facility]  CM consult      [place]   Choice offered to / List presented to:  C-3 Spouse   DME arranged  [place]      DME agency  [place] arranged  HH-1 RN  HH-2 PT      [facility] agency  [place]   Status of service:  In process, will continue to follow Medicare Important Message given?   (If response is "NO", the following Medicare IM given date fields will be blank) Date Medicare IM given:   Date Additional Medicare IM given:    Discharge Disposition:  [place]  Per UR Regulation:  Reviewed for med. necessity/level of care/duration of stay  If discussed at [place] of Stay Meetings, dates discussed:    Comments:  01/27/12 13:48  Rozanna Boer RN:CM Patient needs manual wc 16/18 with amputee pad and foam cushion Spoke with Justine/[facility], made him aware of additional needs for W/C W/C to be delivered to patient's room prior to discharge.

## 2012-01-27 NOTE — Preoperative (Signed)
Beta Blockers   Reason not to administer Beta Blockers:Not Applicable 

## 2012-01-27 NOTE — Transfer of Care (Signed)
Immediate Anesthesia Transfer of Care Note  Patient: Riley Griffith  Procedure(s) Performed: Procedure(s) (LRB) with comments: SECONDARY CLOSURE OF WOUND (Right) - CLOSURE OF RIGHT BELOW KNEE AMPUTATION   Patient Location: PACU  Anesthesia Type: General  Level of Consciousness: sedated and responds to stimulation  Airway & Oxygen Therapy: Patient Spontanous Breathing and Patient connected to nasal cannula oxygen  Post-op Assessment: Report given to PACU RN and Post -op Vital signs reviewed and stable  Post vital signs: Reviewed and stable  Complications: No apparent anesthesia complications

## 2012-01-27 NOTE — Brief Op Note (Signed)
01/25/2012 - 01/27/2012  10:01 AM  PATIENT:  Maia Plan  78 y.o. male  PRE-OPERATIVE DIAGNOSIS:  OPEN RIGHT BELOW KNEE AMPUTATION   POST-OPERATIVE DIAGNOSIS:  open right below knee amputation  PROCEDURE:  Procedure(s) (LRB) with comments: SECONDARY CLOSURE OF WOUND (Right) - CLOSURE OF RIGHT BELOW KNEE AMPUTATION   SURGEON:  Surgeon(s) and Role:    * Rozanna Box, MD - Primary  ANESTHESIA:   general  EBL:  Total I/O In: 700 [I.V.:700] Out: 75 [Blood:75]  BLOOD ADMINISTERED:none  DRAINS: none   LOCAL MEDICATIONS USED:  NONE  SPECIMEN:  No Specimen  DISPOSITION OF SPECIMEN:  N/A  COUNTS:  YES  TOURNIQUET:  * No tourniquets in log *  DICTATION: .Other Dictation: Dictation Number [id number]  PLAN OF CARE: Admit to inpatient   PATIENT DISPOSITION:  PACU - hemodynamically stable.   Delay start of Pharmacological VTE agent (>24hrs) due to surgical blood loss or risk of bleeding: no

## 2012-01-27 NOTE — Progress Notes (Signed)
OT NOTE  OT spoke with wife this AM regarding need for practice and education on wrapping residual limb. MD has given instructions and wife feels comfortable with this education session provided by MD. OT to sign off    Veneda Melter   OTR/L Pager: [phone] Office: [phone] .

## 2012-01-28 LAB — TISSUE CULTURE: Culture: NO GROWTH

## 2012-01-28 NOTE — Anesthesia Postprocedure Evaluation (Signed)
  Anesthesia Post-op Note  Patient: Riley Griffith  Procedure(s) Performed: Procedure(s) (LRB) with comments: SECONDARY CLOSURE OF WOUND (Right) - CLOSURE OF RIGHT BELOW KNEE AMPUTATION   Patient Location: PACU  Anesthesia Type: General  Level of Consciousness: awake  Airway and Oxygen Therapy: Patient Spontanous Breathing  Post-op Pain: mild  Post-op Assessment: Post-op Vital signs reviewed  Post-op Vital Signs: stable  Complications: No apparent anesthesia complications

## 2012-01-30 LAB — ANAEROBIC CULTURE

## 2012-02-01 DIAGNOSIS — I10 Essential (primary) hypertension: Secondary | ICD-10-CM | POA: Diagnosis not present

## 2012-02-01 DIAGNOSIS — E119 Type 2 diabetes mellitus without complications: Secondary | ICD-10-CM | POA: Diagnosis not present

## 2012-02-01 DIAGNOSIS — N189 Chronic kidney disease, unspecified: Secondary | ICD-10-CM | POA: Diagnosis not present

## 2012-02-01 DIAGNOSIS — E785 Hyperlipidemia, unspecified: Secondary | ICD-10-CM | POA: Diagnosis not present

## 2012-02-02 DIAGNOSIS — E119 Type 2 diabetes mellitus without complications: Secondary | ICD-10-CM | POA: Diagnosis not present

## 2012-02-02 DIAGNOSIS — Z4801 Encounter for change or removal of surgical wound dressing: Secondary | ICD-10-CM | POA: Diagnosis not present

## 2012-02-02 DIAGNOSIS — Z794 Long term (current) use of insulin: Secondary | ICD-10-CM | POA: Diagnosis not present

## 2012-02-02 DIAGNOSIS — T8740 Infection of amputation stump, unspecified extremity: Secondary | ICD-10-CM | POA: Diagnosis not present

## 2012-02-02 DIAGNOSIS — I10 Essential (primary) hypertension: Secondary | ICD-10-CM | POA: Diagnosis not present

## 2012-02-02 DIAGNOSIS — M669 Spontaneous rupture of unspecified tendon: Secondary | ICD-10-CM | POA: Diagnosis not present

## 2012-02-04 DIAGNOSIS — I10 Essential (primary) hypertension: Secondary | ICD-10-CM | POA: Diagnosis not present

## 2012-02-04 DIAGNOSIS — Z794 Long term (current) use of insulin: Secondary | ICD-10-CM | POA: Diagnosis not present

## 2012-02-04 DIAGNOSIS — Z4801 Encounter for change or removal of surgical wound dressing: Secondary | ICD-10-CM | POA: Diagnosis not present

## 2012-02-04 DIAGNOSIS — T8740 Infection of amputation stump, unspecified extremity: Secondary | ICD-10-CM | POA: Diagnosis not present

## 2012-02-04 DIAGNOSIS — E119 Type 2 diabetes mellitus without complications: Secondary | ICD-10-CM | POA: Diagnosis not present

## 2012-02-04 DIAGNOSIS — M669 Spontaneous rupture of unspecified tendon: Secondary | ICD-10-CM | POA: Diagnosis not present

## 2012-02-06 DIAGNOSIS — Z4801 Encounter for change or removal of surgical wound dressing: Secondary | ICD-10-CM | POA: Diagnosis not present

## 2012-02-06 DIAGNOSIS — E119 Type 2 diabetes mellitus without complications: Secondary | ICD-10-CM | POA: Diagnosis not present

## 2012-02-06 DIAGNOSIS — I10 Essential (primary) hypertension: Secondary | ICD-10-CM | POA: Diagnosis not present

## 2012-02-06 DIAGNOSIS — M669 Spontaneous rupture of unspecified tendon: Secondary | ICD-10-CM | POA: Diagnosis not present

## 2012-02-06 DIAGNOSIS — T8740 Infection of amputation stump, unspecified extremity: Secondary | ICD-10-CM | POA: Diagnosis not present

## 2012-02-06 DIAGNOSIS — Z794 Long term (current) use of insulin: Secondary | ICD-10-CM | POA: Diagnosis not present

## 2012-02-08 DIAGNOSIS — Z794 Long term (current) use of insulin: Secondary | ICD-10-CM | POA: Diagnosis not present

## 2012-02-08 DIAGNOSIS — Z4801 Encounter for change or removal of surgical wound dressing: Secondary | ICD-10-CM | POA: Diagnosis not present

## 2012-02-08 DIAGNOSIS — I10 Essential (primary) hypertension: Secondary | ICD-10-CM | POA: Diagnosis not present

## 2012-02-08 DIAGNOSIS — E119 Type 2 diabetes mellitus without complications: Secondary | ICD-10-CM | POA: Diagnosis not present

## 2012-02-08 DIAGNOSIS — T8740 Infection of amputation stump, unspecified extremity: Secondary | ICD-10-CM | POA: Diagnosis not present

## 2012-02-08 DIAGNOSIS — M669 Spontaneous rupture of unspecified tendon: Secondary | ICD-10-CM | POA: Diagnosis not present

## 2012-02-10 ENCOUNTER — Ambulatory Visit: Payer: PRIVATE HEALTH INSURANCE | Admitting: Infectious Disease

## 2012-02-11 DIAGNOSIS — M669 Spontaneous rupture of unspecified tendon: Secondary | ICD-10-CM | POA: Diagnosis not present

## 2012-02-11 DIAGNOSIS — I10 Essential (primary) hypertension: Secondary | ICD-10-CM | POA: Diagnosis not present

## 2012-02-11 DIAGNOSIS — Z4801 Encounter for change or removal of surgical wound dressing: Secondary | ICD-10-CM | POA: Diagnosis not present

## 2012-02-11 DIAGNOSIS — T8740 Infection of amputation stump, unspecified extremity: Secondary | ICD-10-CM | POA: Diagnosis not present

## 2012-02-11 DIAGNOSIS — E119 Type 2 diabetes mellitus without complications: Secondary | ICD-10-CM | POA: Diagnosis not present

## 2012-02-11 DIAGNOSIS — Z794 Long term (current) use of insulin: Secondary | ICD-10-CM | POA: Diagnosis not present

## 2012-02-13 DIAGNOSIS — T8740 Infection of amputation stump, unspecified extremity: Secondary | ICD-10-CM | POA: Diagnosis not present

## 2012-02-13 DIAGNOSIS — Z794 Long term (current) use of insulin: Secondary | ICD-10-CM | POA: Diagnosis not present

## 2012-02-13 DIAGNOSIS — E119 Type 2 diabetes mellitus without complications: Secondary | ICD-10-CM | POA: Diagnosis not present

## 2012-02-13 DIAGNOSIS — Z4801 Encounter for change or removal of surgical wound dressing: Secondary | ICD-10-CM | POA: Diagnosis not present

## 2012-02-13 DIAGNOSIS — M669 Spontaneous rupture of unspecified tendon: Secondary | ICD-10-CM | POA: Diagnosis not present

## 2012-02-13 DIAGNOSIS — I10 Essential (primary) hypertension: Secondary | ICD-10-CM | POA: Diagnosis not present

## 2012-02-14 DIAGNOSIS — Z23 Encounter for immunization: Secondary | ICD-10-CM | POA: Diagnosis not present

## 2012-02-15 DIAGNOSIS — E119 Type 2 diabetes mellitus without complications: Secondary | ICD-10-CM | POA: Diagnosis not present

## 2012-02-15 DIAGNOSIS — E1159 Type 2 diabetes mellitus with other circulatory complications: Secondary | ICD-10-CM | POA: Diagnosis not present

## 2012-02-29 DIAGNOSIS — H4011X0 Primary open-angle glaucoma, stage unspecified: Secondary | ICD-10-CM | POA: Diagnosis not present

## 2012-02-29 DIAGNOSIS — E1139 Type 2 diabetes mellitus with other diabetic ophthalmic complication: Secondary | ICD-10-CM | POA: Diagnosis not present

## 2012-02-29 DIAGNOSIS — E11359 Type 2 diabetes mellitus with proliferative diabetic retinopathy without macular edema: Secondary | ICD-10-CM | POA: Diagnosis not present

## 2012-03-06 NOTE — Discharge Summary (Signed)
[facility]  Patient ID: Riley Griffith MRN: [id number] DOB/AGE: Mar 30, 1924 78 y.o.  Admit date: 01/25/2012 Discharge date: 03/06/2012  Admission Diagnoses: Infected R BKA DM HTN GERD  Discharge Diagnoses:  Same as above  Procedures Performed:  01/25/2012- 1. Excision of osteomyelitis and revision right below-knee amputation.    2. Application of wound VAC.   01/27/2012- closure on of open wound R bka  Discharged Condition: good  Hospital Course:  78 y/o male well know to OTS. Has been dealing with open draining wound for several weeks. Attempts were made to treat non-op but were unsuccessful. Pt taken to OR on 01/25/2012 for excision of infected area with placement of VAC. On POD 2 he was taken back to OR for closure of wound and was discharged from PACU. Pt did not have any issues during his hospital stay. Discharged in stable condition  Consults: None   Treatments: IV hydration, antibiotics/antifungals: Ancef and augmentin and fluconazole, analgesia: Norco, anticoagulation: LMW heparin, insulin: Lantus, therapies: PT, OT and RN and surgery: as above  Discharge Exam:  Comfortable sitting in chair  VSS  RLE clean, dry, intact Stable post op exam Discharge from pacu   Disposition: 06-Home-Health Care Svc  Discharge Orders    Future Orders Please Complete By Expires   Constipation Prevention      Comments:   Drink plenty of fluids.  Prune juice may be helpful.  You may use a stool softener, such as Colace (over the counter) 100 mg twice a day.  Use MiraLax (over the counter) for constipation as needed.   Increase activity slowly as tolerated          Medication List     As of 03/06/2012  2:01 PM    TAKE these medications         acetaminophen 500 MG tablet   Commonly known as: TYLENOL   Take 500 mg by mouth every 6 (six) hours as needed.      amLODipine 10 MG tablet   Commonly known as: NORVASC   Take 10 mg by mouth 2 (two) times  daily.      amoxicillin-clavulanate 875-125 MG per tablet   Commonly known as: AUGMENTIN   Take 1 tablet by mouth 2 (two) times daily.      amoxicillin-clavulanate 875-125 MG per tablet   Commonly known as: AUGMENTIN   Take 1 tablet by mouth 2 (two) times daily.      aspirin 81 MG chewable tablet   Chew 81 mg by mouth daily.      bimatoprost 0.01 % Soln   Commonly known as: LUMIGAN   Place 1 drop into the left eye at bedtime.      brimonidine 0.1 % Soln   Commonly known as: ALPHAGAN P   Place 1 drop into the left eye 2 (two) times daily.      calcitRIOL 0.25 MCG capsule   Commonly known as: ROCALTROL   Take 0.25 mcg by mouth daily.      cholecalciferol 1000 UNITS tablet   Commonly known as: VITAMIN D   Take 1,000 Units by mouth daily.      co-enzyme Q-10 30 MG capsule   Take 30 mg by mouth daily.      ferrous sulfate 325 (65 FE) MG tablet   Take 325 mg by mouth daily with breakfast.      fish oil-omega-3 fatty acids 1000 MG capsule   Take 2 g by mouth daily.  fluconazole 200 MG tablet   Commonly known as: DIFLUCAN   Take 2 tablets (400 mg total) by mouth daily.      furosemide 20 MG tablet   Commonly known as: LASIX   Take 20 mg by mouth daily as needed. For swelling      HYDROcodone-acetaminophen 5-325 MG per tablet   Commonly known as: NORCO/VICODIN   Take 1-2 tablets by mouth every 6 (six) hours as needed for pain.      insulin glargine 100 UNIT/ML injection   Commonly known as: LANTUS   Inject 14 Units into the skin daily.      lisinopril 10 MG tablet   Commonly known as: PRINIVIL,ZESTRIL   Take 10 mg by mouth daily.      rosuvastatin 10 MG tablet   Commonly known as: CRESTOR   Take 10 mg by mouth daily.      Tamsulosin HCl 0.4 MG Caps   Commonly known as: FLOMAX   Take 0.4 mg by mouth daily after supper.      vitamin C 500 MG tablet   Commonly known as: ASCORBIC ACID   Take 500 mg by mouth daily.           Follow-up Information     Call Rozanna Box, MD. (to make an apppointment for this Monday)    Contact information:   [address] [place] [address] [id number]           Signed:  Jari Pigg, PA-C [facility] [phone] (P) 03/06/2012, 2:01 PM

## 2012-03-15 DIAGNOSIS — IMO0002 Reserved for concepts with insufficient information to code with codable children: Secondary | ICD-10-CM | POA: Diagnosis not present

## 2012-03-15 DIAGNOSIS — T8789 Other complications of amputation stump: Secondary | ICD-10-CM | POA: Diagnosis not present

## 2012-03-15 DIAGNOSIS — Z794 Long term (current) use of insulin: Secondary | ICD-10-CM | POA: Diagnosis not present

## 2012-03-15 DIAGNOSIS — T879 Unspecified complications of amputation stump: Secondary | ICD-10-CM | POA: Diagnosis not present

## 2012-03-15 DIAGNOSIS — S88119A Complete traumatic amputation at level between knee and ankle, unspecified lower leg, initial encounter: Secondary | ICD-10-CM | POA: Diagnosis not present

## 2012-03-15 DIAGNOSIS — E119 Type 2 diabetes mellitus without complications: Secondary | ICD-10-CM | POA: Diagnosis not present

## 2012-03-15 DIAGNOSIS — L97809 Non-pressure chronic ulcer of other part of unspecified lower leg with unspecified severity: Secondary | ICD-10-CM | POA: Diagnosis not present

## 2012-03-15 DIAGNOSIS — Z79899 Other long term (current) drug therapy: Secondary | ICD-10-CM | POA: Diagnosis not present

## 2012-03-15 DIAGNOSIS — Z833 Family history of diabetes mellitus: Secondary | ICD-10-CM | POA: Diagnosis not present

## 2012-03-15 DIAGNOSIS — Z7982 Long term (current) use of aspirin: Secondary | ICD-10-CM | POA: Diagnosis not present

## 2012-03-15 DIAGNOSIS — I10 Essential (primary) hypertension: Secondary | ICD-10-CM | POA: Diagnosis not present

## 2012-03-20 DIAGNOSIS — Z794 Long term (current) use of insulin: Secondary | ICD-10-CM | POA: Diagnosis not present

## 2012-03-20 DIAGNOSIS — T8789 Other complications of amputation stump: Secondary | ICD-10-CM | POA: Diagnosis not present

## 2012-03-20 DIAGNOSIS — L97809 Non-pressure chronic ulcer of other part of unspecified lower leg with unspecified severity: Secondary | ICD-10-CM | POA: Diagnosis not present

## 2012-03-20 DIAGNOSIS — E119 Type 2 diabetes mellitus without complications: Secondary | ICD-10-CM | POA: Diagnosis not present

## 2012-03-20 DIAGNOSIS — Z79899 Other long term (current) drug therapy: Secondary | ICD-10-CM | POA: Diagnosis not present

## 2012-03-20 DIAGNOSIS — I10 Essential (primary) hypertension: Secondary | ICD-10-CM | POA: Diagnosis not present

## 2012-03-29 DIAGNOSIS — S78119A Complete traumatic amputation at level between unspecified hip and knee, initial encounter: Secondary | ICD-10-CM | POA: Diagnosis not present

## 2012-03-29 DIAGNOSIS — Z09 Encounter for follow-up examination after completed treatment for conditions other than malignant neoplasm: Secondary | ICD-10-CM | POA: Diagnosis not present

## 2012-04-05 DIAGNOSIS — IMO0002 Reserved for concepts with insufficient information to code with codable children: Secondary | ICD-10-CM | POA: Diagnosis not present

## 2012-04-05 DIAGNOSIS — S88119A Complete traumatic amputation at level between knee and ankle, unspecified lower leg, initial encounter: Secondary | ICD-10-CM | POA: Diagnosis not present

## 2012-04-05 DIAGNOSIS — T879 Unspecified complications of amputation stump: Secondary | ICD-10-CM | POA: Diagnosis not present

## 2012-04-05 DIAGNOSIS — Z09 Encounter for follow-up examination after completed treatment for conditions other than malignant neoplasm: Secondary | ICD-10-CM | POA: Diagnosis not present

## 2012-04-05 DIAGNOSIS — S78119A Complete traumatic amputation at level between unspecified hip and knee, initial encounter: Secondary | ICD-10-CM | POA: Diagnosis not present

## 2012-04-07 DIAGNOSIS — T879 Unspecified complications of amputation stump: Secondary | ICD-10-CM | POA: Diagnosis not present

## 2012-04-07 DIAGNOSIS — S88119A Complete traumatic amputation at level between knee and ankle, unspecified lower leg, initial encounter: Secondary | ICD-10-CM | POA: Diagnosis not present

## 2012-04-07 DIAGNOSIS — IMO0002 Reserved for concepts with insufficient information to code with codable children: Secondary | ICD-10-CM | POA: Diagnosis not present

## 2012-04-27 DIAGNOSIS — T879 Unspecified complications of amputation stump: Secondary | ICD-10-CM | POA: Diagnosis not present

## 2012-04-27 DIAGNOSIS — Z4789 Encounter for other orthopedic aftercare: Secondary | ICD-10-CM | POA: Diagnosis not present

## 2012-04-27 DIAGNOSIS — S88119A Complete traumatic amputation at level between knee and ankle, unspecified lower leg, initial encounter: Secondary | ICD-10-CM | POA: Diagnosis not present

## 2012-05-04 DIAGNOSIS — E1165 Type 2 diabetes mellitus with hyperglycemia: Secondary | ICD-10-CM | POA: Diagnosis not present

## 2012-05-04 DIAGNOSIS — I10 Essential (primary) hypertension: Secondary | ICD-10-CM | POA: Diagnosis not present

## 2012-05-04 DIAGNOSIS — N189 Chronic kidney disease, unspecified: Secondary | ICD-10-CM | POA: Diagnosis not present

## 2012-05-04 DIAGNOSIS — E1129 Type 2 diabetes mellitus with other diabetic kidney complication: Secondary | ICD-10-CM | POA: Diagnosis not present

## 2012-05-04 DIAGNOSIS — E785 Hyperlipidemia, unspecified: Secondary | ICD-10-CM | POA: Diagnosis not present

## 2012-05-08 DIAGNOSIS — N189 Chronic kidney disease, unspecified: Secondary | ICD-10-CM | POA: Diagnosis not present

## 2012-05-08 DIAGNOSIS — D649 Anemia, unspecified: Secondary | ICD-10-CM | POA: Diagnosis not present

## 2012-05-08 DIAGNOSIS — I10 Essential (primary) hypertension: Secondary | ICD-10-CM | POA: Diagnosis not present

## 2012-05-08 DIAGNOSIS — R809 Proteinuria, unspecified: Secondary | ICD-10-CM | POA: Diagnosis not present

## 2012-05-08 DIAGNOSIS — Z79899 Other long term (current) drug therapy: Secondary | ICD-10-CM | POA: Diagnosis not present

## 2012-05-08 DIAGNOSIS — E559 Vitamin D deficiency, unspecified: Secondary | ICD-10-CM | POA: Diagnosis not present

## 2012-05-09 DIAGNOSIS — I10 Essential (primary) hypertension: Secondary | ICD-10-CM | POA: Diagnosis not present

## 2012-05-09 DIAGNOSIS — E875 Hyperkalemia: Secondary | ICD-10-CM | POA: Diagnosis not present

## 2012-05-09 DIAGNOSIS — D509 Iron deficiency anemia, unspecified: Secondary | ICD-10-CM | POA: Diagnosis not present

## 2012-05-09 DIAGNOSIS — I509 Heart failure, unspecified: Secondary | ICD-10-CM | POA: Diagnosis not present

## 2012-05-30 DIAGNOSIS — E119 Type 2 diabetes mellitus without complications: Secondary | ICD-10-CM | POA: Diagnosis not present

## 2012-05-30 DIAGNOSIS — E1159 Type 2 diabetes mellitus with other circulatory complications: Secondary | ICD-10-CM | POA: Diagnosis not present

## 2012-06-22 DIAGNOSIS — E1139 Type 2 diabetes mellitus with other diabetic ophthalmic complication: Secondary | ICD-10-CM | POA: Diagnosis not present

## 2012-06-22 DIAGNOSIS — Z961 Presence of intraocular lens: Secondary | ICD-10-CM | POA: Diagnosis not present

## 2012-07-27 DIAGNOSIS — I10 Essential (primary) hypertension: Secondary | ICD-10-CM | POA: Diagnosis not present

## 2012-07-27 DIAGNOSIS — E1129 Type 2 diabetes mellitus with other diabetic kidney complication: Secondary | ICD-10-CM | POA: Diagnosis not present

## 2012-07-27 DIAGNOSIS — E1165 Type 2 diabetes mellitus with hyperglycemia: Secondary | ICD-10-CM | POA: Diagnosis not present

## 2012-07-27 DIAGNOSIS — E785 Hyperlipidemia, unspecified: Secondary | ICD-10-CM | POA: Diagnosis not present

## 2012-08-08 DIAGNOSIS — E1159 Type 2 diabetes mellitus with other circulatory complications: Secondary | ICD-10-CM | POA: Diagnosis not present

## 2012-08-08 DIAGNOSIS — E119 Type 2 diabetes mellitus without complications: Secondary | ICD-10-CM | POA: Diagnosis not present

## 2012-08-29 DIAGNOSIS — H35369 Drusen (degenerative) of macula, unspecified eye: Secondary | ICD-10-CM | POA: Diagnosis not present

## 2012-08-29 DIAGNOSIS — E1165 Type 2 diabetes mellitus with hyperglycemia: Secondary | ICD-10-CM | POA: Diagnosis not present

## 2012-08-29 DIAGNOSIS — E11311 Type 2 diabetes mellitus with unspecified diabetic retinopathy with macular edema: Secondary | ICD-10-CM | POA: Diagnosis not present

## 2012-08-31 DIAGNOSIS — N189 Chronic kidney disease, unspecified: Secondary | ICD-10-CM | POA: Diagnosis not present

## 2012-08-31 DIAGNOSIS — R809 Proteinuria, unspecified: Secondary | ICD-10-CM | POA: Diagnosis not present

## 2012-08-31 DIAGNOSIS — Z79899 Other long term (current) drug therapy: Secondary | ICD-10-CM | POA: Diagnosis not present

## 2012-08-31 DIAGNOSIS — E1129 Type 2 diabetes mellitus with other diabetic kidney complication: Secondary | ICD-10-CM | POA: Diagnosis not present

## 2012-09-05 DIAGNOSIS — I509 Heart failure, unspecified: Secondary | ICD-10-CM | POA: Diagnosis not present

## 2012-09-05 DIAGNOSIS — E875 Hyperkalemia: Secondary | ICD-10-CM | POA: Diagnosis not present

## 2012-09-05 DIAGNOSIS — I10 Essential (primary) hypertension: Secondary | ICD-10-CM | POA: Diagnosis not present

## 2012-09-05 DIAGNOSIS — D509 Iron deficiency anemia, unspecified: Secondary | ICD-10-CM | POA: Diagnosis not present

## 2012-09-25 DIAGNOSIS — Z23 Encounter for immunization: Secondary | ICD-10-CM | POA: Diagnosis not present

## 2012-09-26 DIAGNOSIS — Z23 Encounter for immunization: Secondary | ICD-10-CM | POA: Diagnosis not present

## 2012-10-02 DIAGNOSIS — S88119A Complete traumatic amputation at level between knee and ankle, unspecified lower leg, initial encounter: Secondary | ICD-10-CM | POA: Diagnosis not present

## 2012-10-24 DIAGNOSIS — E1159 Type 2 diabetes mellitus with other circulatory complications: Secondary | ICD-10-CM | POA: Diagnosis not present

## 2012-10-24 DIAGNOSIS — E119 Type 2 diabetes mellitus without complications: Secondary | ICD-10-CM | POA: Diagnosis not present

## 2012-11-10 DIAGNOSIS — E119 Type 2 diabetes mellitus without complications: Secondary | ICD-10-CM | POA: Diagnosis not present

## 2012-11-10 DIAGNOSIS — E785 Hyperlipidemia, unspecified: Secondary | ICD-10-CM | POA: Diagnosis not present

## 2012-11-10 DIAGNOSIS — I10 Essential (primary) hypertension: Secondary | ICD-10-CM | POA: Diagnosis not present

## 2012-11-17 DIAGNOSIS — E785 Hyperlipidemia, unspecified: Secondary | ICD-10-CM | POA: Diagnosis not present

## 2012-11-17 DIAGNOSIS — I10 Essential (primary) hypertension: Secondary | ICD-10-CM | POA: Diagnosis not present

## 2012-11-17 DIAGNOSIS — N184 Chronic kidney disease, stage 4 (severe): Secondary | ICD-10-CM | POA: Diagnosis not present

## 2012-11-17 DIAGNOSIS — E1129 Type 2 diabetes mellitus with other diabetic kidney complication: Secondary | ICD-10-CM | POA: Diagnosis not present

## 2012-11-17 DIAGNOSIS — E1165 Type 2 diabetes mellitus with hyperglycemia: Secondary | ICD-10-CM | POA: Diagnosis not present

## 2013-01-02 DIAGNOSIS — E1159 Type 2 diabetes mellitus with other circulatory complications: Secondary | ICD-10-CM | POA: Diagnosis not present

## 2013-01-02 DIAGNOSIS — E119 Type 2 diabetes mellitus without complications: Secondary | ICD-10-CM | POA: Diagnosis not present

## 2013-01-04 DIAGNOSIS — Z23 Encounter for immunization: Secondary | ICD-10-CM | POA: Diagnosis not present

## 2013-01-11 DIAGNOSIS — Z79899 Other long term (current) drug therapy: Secondary | ICD-10-CM | POA: Diagnosis not present

## 2013-01-11 DIAGNOSIS — N189 Chronic kidney disease, unspecified: Secondary | ICD-10-CM | POA: Diagnosis not present

## 2013-01-11 DIAGNOSIS — R809 Proteinuria, unspecified: Secondary | ICD-10-CM | POA: Diagnosis not present

## 2013-01-11 DIAGNOSIS — D649 Anemia, unspecified: Secondary | ICD-10-CM | POA: Diagnosis not present

## 2013-02-06 DIAGNOSIS — I10 Essential (primary) hypertension: Secondary | ICD-10-CM | POA: Diagnosis not present

## 2013-02-06 DIAGNOSIS — N184 Chronic kidney disease, stage 4 (severe): Secondary | ICD-10-CM | POA: Diagnosis not present

## 2013-02-06 DIAGNOSIS — I509 Heart failure, unspecified: Secondary | ICD-10-CM | POA: Diagnosis not present

## 2013-02-06 DIAGNOSIS — R809 Proteinuria, unspecified: Secondary | ICD-10-CM | POA: Diagnosis not present

## 2013-03-06 DIAGNOSIS — E1139 Type 2 diabetes mellitus with other diabetic ophthalmic complication: Secondary | ICD-10-CM | POA: Diagnosis not present

## 2013-03-06 DIAGNOSIS — E11359 Type 2 diabetes mellitus with proliferative diabetic retinopathy without macular edema: Secondary | ICD-10-CM | POA: Diagnosis not present

## 2013-03-06 DIAGNOSIS — E11339 Type 2 diabetes mellitus with moderate nonproliferative diabetic retinopathy without macular edema: Secondary | ICD-10-CM | POA: Diagnosis not present

## 2013-03-20 DIAGNOSIS — E119 Type 2 diabetes mellitus without complications: Secondary | ICD-10-CM | POA: Diagnosis not present

## 2013-03-20 DIAGNOSIS — E1159 Type 2 diabetes mellitus with other circulatory complications: Secondary | ICD-10-CM | POA: Diagnosis not present

## 2013-04-11 DIAGNOSIS — Z79899 Other long term (current) drug therapy: Secondary | ICD-10-CM | POA: Diagnosis not present

## 2013-04-11 DIAGNOSIS — Z794 Long term (current) use of insulin: Secondary | ICD-10-CM | POA: Diagnosis not present

## 2013-04-11 DIAGNOSIS — Z7982 Long term (current) use of aspirin: Secondary | ICD-10-CM | POA: Diagnosis not present

## 2013-04-11 DIAGNOSIS — T8789 Other complications of amputation stump: Secondary | ICD-10-CM | POA: Diagnosis not present

## 2013-04-11 DIAGNOSIS — L02419 Cutaneous abscess of limb, unspecified: Secondary | ICD-10-CM | POA: Diagnosis not present

## 2013-04-11 DIAGNOSIS — E119 Type 2 diabetes mellitus without complications: Secondary | ICD-10-CM | POA: Diagnosis not present

## 2013-04-11 DIAGNOSIS — S88119A Complete traumatic amputation at level between knee and ankle, unspecified lower leg, initial encounter: Secondary | ICD-10-CM | POA: Diagnosis not present

## 2013-05-23 DIAGNOSIS — E119 Type 2 diabetes mellitus without complications: Secondary | ICD-10-CM | POA: Diagnosis not present

## 2013-05-23 DIAGNOSIS — E785 Hyperlipidemia, unspecified: Secondary | ICD-10-CM | POA: Diagnosis not present

## 2013-05-23 DIAGNOSIS — I10 Essential (primary) hypertension: Secondary | ICD-10-CM | POA: Diagnosis not present

## 2013-05-29 DIAGNOSIS — E1159 Type 2 diabetes mellitus with other circulatory complications: Secondary | ICD-10-CM | POA: Diagnosis not present

## 2013-05-29 DIAGNOSIS — E119 Type 2 diabetes mellitus without complications: Secondary | ICD-10-CM | POA: Diagnosis not present

## 2013-05-30 DIAGNOSIS — N189 Chronic kidney disease, unspecified: Secondary | ICD-10-CM | POA: Diagnosis not present

## 2013-05-30 DIAGNOSIS — E1129 Type 2 diabetes mellitus with other diabetic kidney complication: Secondary | ICD-10-CM | POA: Diagnosis not present

## 2013-05-30 DIAGNOSIS — I10 Essential (primary) hypertension: Secondary | ICD-10-CM | POA: Diagnosis not present

## 2013-05-30 DIAGNOSIS — E785 Hyperlipidemia, unspecified: Secondary | ICD-10-CM | POA: Diagnosis not present

## 2013-05-30 DIAGNOSIS — E1165 Type 2 diabetes mellitus with hyperglycemia: Secondary | ICD-10-CM | POA: Diagnosis not present

## 2013-06-06 DIAGNOSIS — S88119A Complete traumatic amputation at level between knee and ankle, unspecified lower leg, initial encounter: Secondary | ICD-10-CM | POA: Diagnosis not present

## 2013-07-11 DIAGNOSIS — I10 Essential (primary) hypertension: Secondary | ICD-10-CM | POA: Diagnosis not present

## 2013-07-11 DIAGNOSIS — N189 Chronic kidney disease, unspecified: Secondary | ICD-10-CM | POA: Diagnosis not present

## 2013-07-11 DIAGNOSIS — E559 Vitamin D deficiency, unspecified: Secondary | ICD-10-CM | POA: Diagnosis not present

## 2013-07-11 DIAGNOSIS — Z79899 Other long term (current) drug therapy: Secondary | ICD-10-CM | POA: Diagnosis not present

## 2013-07-11 DIAGNOSIS — D649 Anemia, unspecified: Secondary | ICD-10-CM | POA: Diagnosis not present

## 2013-07-11 DIAGNOSIS — R809 Proteinuria, unspecified: Secondary | ICD-10-CM | POA: Diagnosis not present

## 2013-07-24 DIAGNOSIS — N189 Chronic kidney disease, unspecified: Secondary | ICD-10-CM | POA: Diagnosis not present

## 2013-07-24 DIAGNOSIS — Z79899 Other long term (current) drug therapy: Secondary | ICD-10-CM | POA: Diagnosis not present

## 2013-07-24 DIAGNOSIS — D509 Iron deficiency anemia, unspecified: Secondary | ICD-10-CM | POA: Diagnosis not present

## 2013-07-24 DIAGNOSIS — N184 Chronic kidney disease, stage 4 (severe): Secondary | ICD-10-CM | POA: Diagnosis not present

## 2013-07-24 DIAGNOSIS — I10 Essential (primary) hypertension: Secondary | ICD-10-CM | POA: Diagnosis not present

## 2013-07-24 DIAGNOSIS — R809 Proteinuria, unspecified: Secondary | ICD-10-CM | POA: Diagnosis not present

## 2013-07-24 DIAGNOSIS — I509 Heart failure, unspecified: Secondary | ICD-10-CM | POA: Diagnosis not present

## 2013-07-24 DIAGNOSIS — E875 Hyperkalemia: Secondary | ICD-10-CM | POA: Diagnosis not present

## 2013-08-08 IMAGING — CR DG CHEST 2V
2 series · 2 of 2 positions shown · non-contrast
Comparison: 03/24/2011

CLINICAL DATA: Preop, Below-knee amputation

CHEST - 2 VIEW

[view not recorded (1 of 2)]
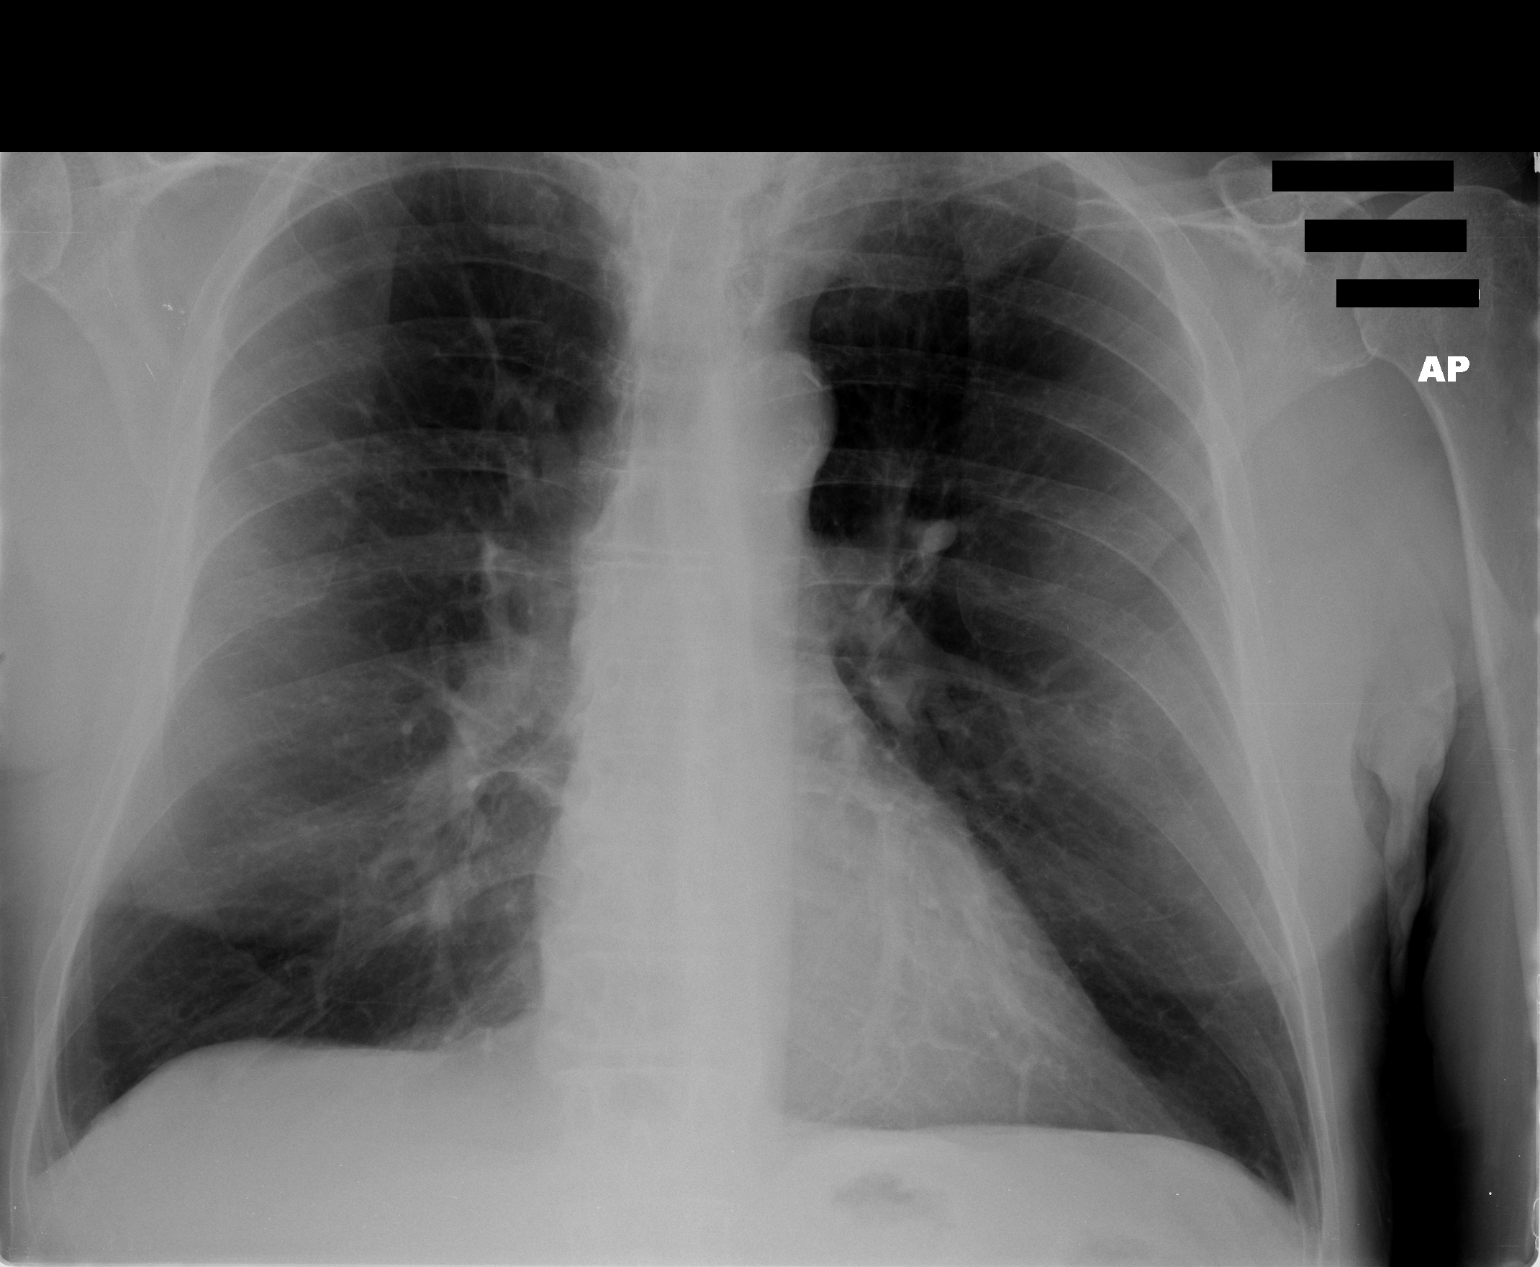

[view not recorded (2 of 2)]
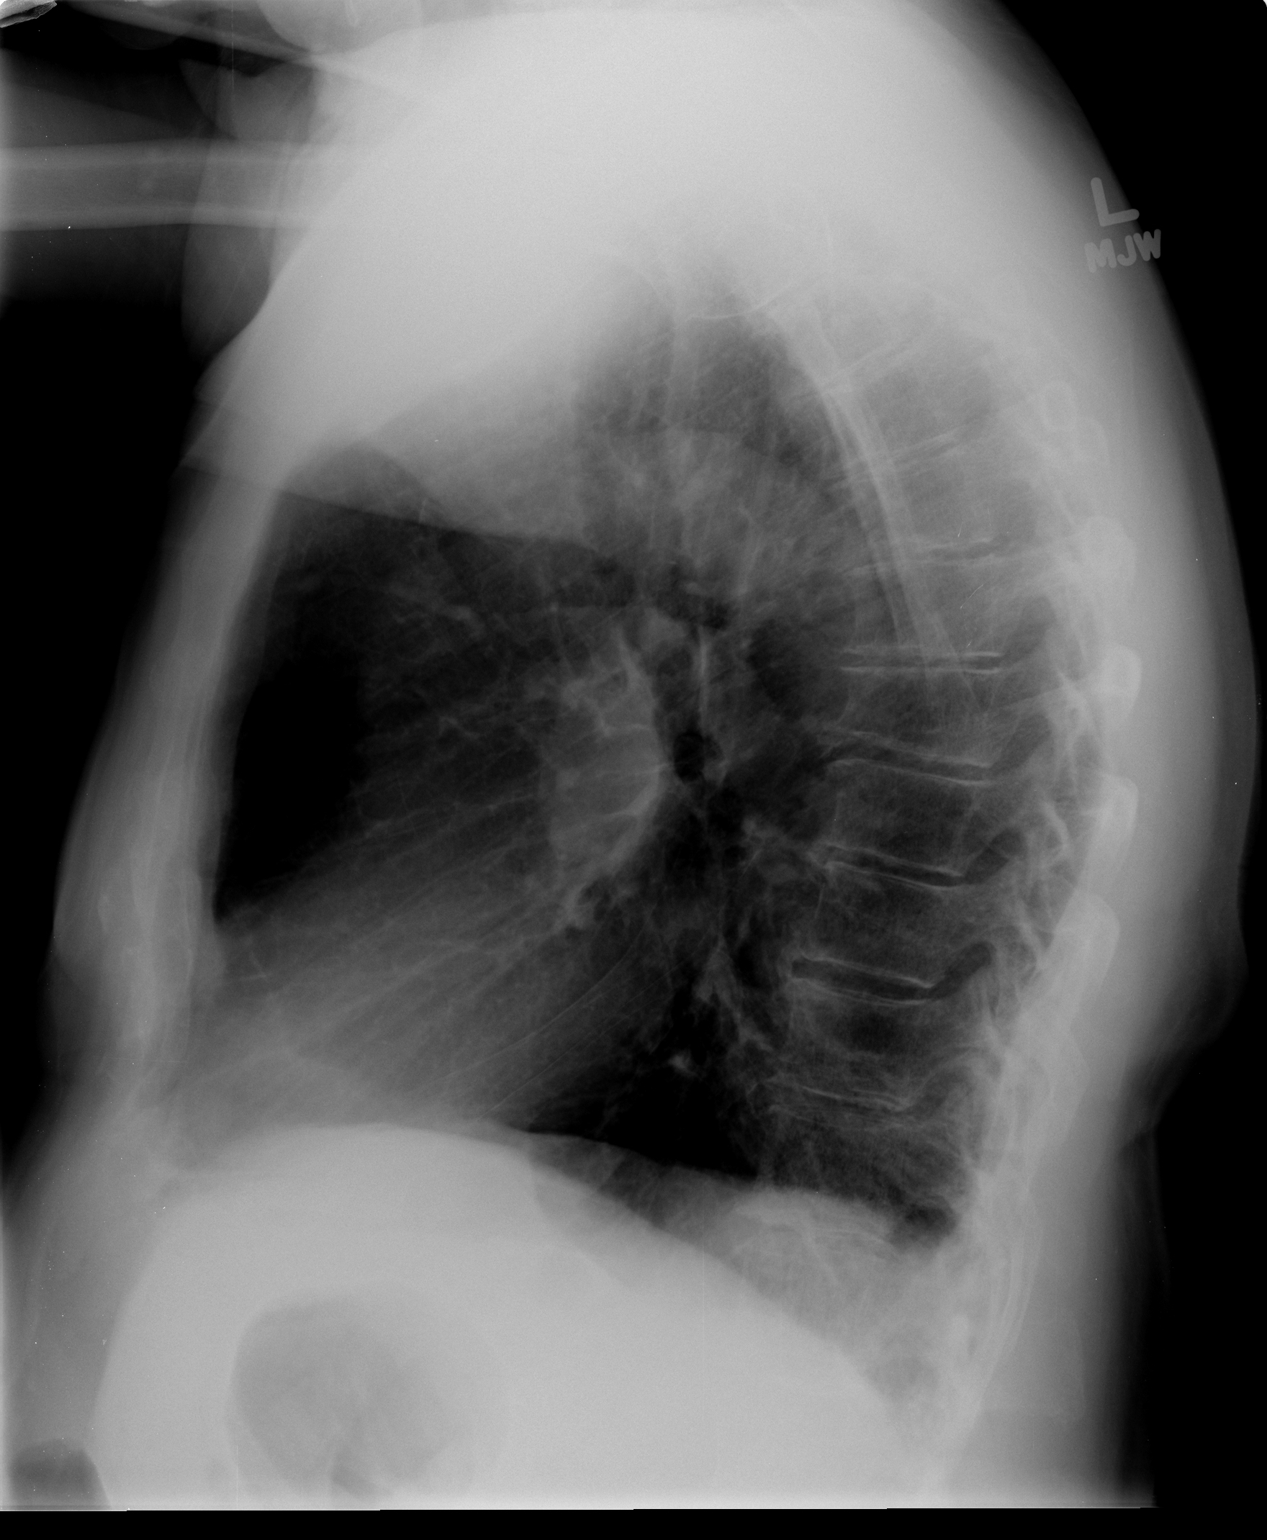

[2 of 2 positions shown; findings below may reference images not displayed]

FINDINGS: Cardiomediastinal silhouette is unremarkable.  No acute
infiltrate or pulmonary edema.  Mild hyperinflation.  Stable mild
compression deformity mid thoracic spine.
IMPRESSION: No active disease.

## 2013-08-09 IMAGING — RF DG TIBIA/FIBULA 2V*R*
1 series · 1 of 1 positions shown · non-contrast
Comparison: MRI 01/14/2012.

CLINICAL DATA: Below-knee amputation.

RIGHT TIBIA AND FIBULA - 2 VIEW,DG C-ARM 1-60 MIN

[Series 1: run · 1 of 1 slices shown]
[im 1/1]
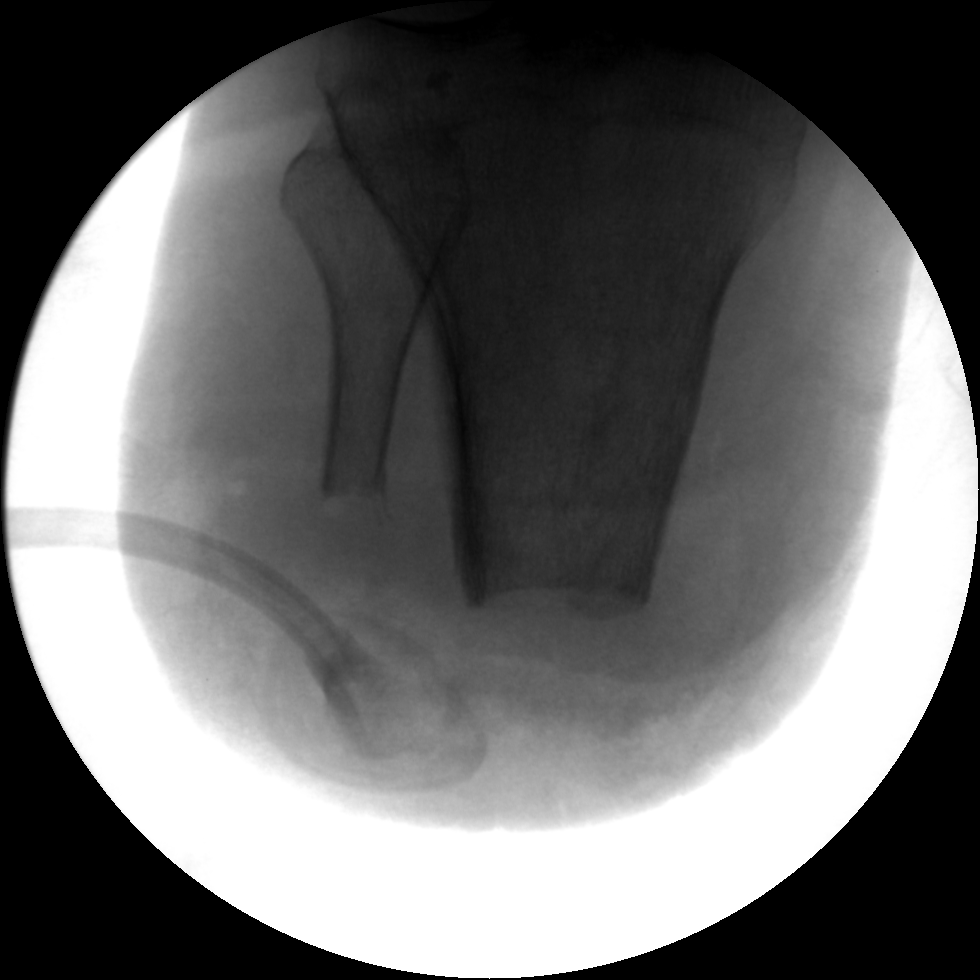

[1 of 1 positions shown; findings below may reference images not displayed]

FINDINGS: Fluoroscopic spot images demonstrate a below-knee
amputation changes.  No complicating features.
IMPRESSION: As above.

## 2013-08-24 DIAGNOSIS — I10 Essential (primary) hypertension: Secondary | ICD-10-CM | POA: Diagnosis not present

## 2013-08-24 DIAGNOSIS — E119 Type 2 diabetes mellitus without complications: Secondary | ICD-10-CM | POA: Diagnosis not present

## 2013-08-29 DIAGNOSIS — I10 Essential (primary) hypertension: Secondary | ICD-10-CM | POA: Diagnosis not present

## 2013-08-29 DIAGNOSIS — E1165 Type 2 diabetes mellitus with hyperglycemia: Secondary | ICD-10-CM | POA: Diagnosis not present

## 2013-08-29 DIAGNOSIS — E785 Hyperlipidemia, unspecified: Secondary | ICD-10-CM | POA: Diagnosis not present

## 2013-08-29 DIAGNOSIS — N184 Chronic kidney disease, stage 4 (severe): Secondary | ICD-10-CM | POA: Diagnosis not present

## 2013-08-29 DIAGNOSIS — E1129 Type 2 diabetes mellitus with other diabetic kidney complication: Secondary | ICD-10-CM | POA: Diagnosis not present

## 2013-09-04 DIAGNOSIS — E1159 Type 2 diabetes mellitus with other circulatory complications: Secondary | ICD-10-CM | POA: Diagnosis not present

## 2013-09-04 DIAGNOSIS — E119 Type 2 diabetes mellitus without complications: Secondary | ICD-10-CM | POA: Diagnosis not present

## 2013-11-13 DIAGNOSIS — E119 Type 2 diabetes mellitus without complications: Secondary | ICD-10-CM | POA: Diagnosis not present

## 2013-11-13 DIAGNOSIS — E1159 Type 2 diabetes mellitus with other circulatory complications: Secondary | ICD-10-CM | POA: Diagnosis not present

## 2013-11-15 DIAGNOSIS — E1129 Type 2 diabetes mellitus with other diabetic kidney complication: Secondary | ICD-10-CM | POA: Diagnosis not present

## 2013-11-15 DIAGNOSIS — D649 Anemia, unspecified: Secondary | ICD-10-CM | POA: Diagnosis not present

## 2013-11-15 DIAGNOSIS — N189 Chronic kidney disease, unspecified: Secondary | ICD-10-CM | POA: Diagnosis not present

## 2013-11-15 DIAGNOSIS — E559 Vitamin D deficiency, unspecified: Secondary | ICD-10-CM | POA: Diagnosis not present

## 2013-11-20 DIAGNOSIS — N184 Chronic kidney disease, stage 4 (severe): Secondary | ICD-10-CM | POA: Diagnosis not present

## 2013-11-20 DIAGNOSIS — D509 Iron deficiency anemia, unspecified: Secondary | ICD-10-CM | POA: Diagnosis not present

## 2013-11-20 DIAGNOSIS — R809 Proteinuria, unspecified: Secondary | ICD-10-CM | POA: Diagnosis not present

## 2013-11-20 DIAGNOSIS — I10 Essential (primary) hypertension: Secondary | ICD-10-CM | POA: Diagnosis not present

## 2013-12-04 DIAGNOSIS — H31009 Unspecified chorioretinal scars, unspecified eye: Secondary | ICD-10-CM | POA: Diagnosis not present

## 2013-12-04 DIAGNOSIS — E11339 Type 2 diabetes mellitus with moderate nonproliferative diabetic retinopathy without macular edema: Secondary | ICD-10-CM | POA: Diagnosis not present

## 2013-12-04 DIAGNOSIS — H4720 Unspecified optic atrophy: Secondary | ICD-10-CM | POA: Diagnosis not present

## 2013-12-04 DIAGNOSIS — E11359 Type 2 diabetes mellitus with proliferative diabetic retinopathy without macular edema: Secondary | ICD-10-CM | POA: Diagnosis not present

## 2013-12-04 DIAGNOSIS — E1139 Type 2 diabetes mellitus with other diabetic ophthalmic complication: Secondary | ICD-10-CM | POA: Diagnosis not present

## 2013-12-28 DIAGNOSIS — I10 Essential (primary) hypertension: Secondary | ICD-10-CM | POA: Diagnosis not present

## 2013-12-28 DIAGNOSIS — E119 Type 2 diabetes mellitus without complications: Secondary | ICD-10-CM | POA: Diagnosis not present

## 2013-12-28 DIAGNOSIS — E785 Hyperlipidemia, unspecified: Secondary | ICD-10-CM | POA: Diagnosis not present

## 2014-01-01 DIAGNOSIS — N189 Chronic kidney disease, unspecified: Secondary | ICD-10-CM | POA: Diagnosis not present

## 2014-01-01 DIAGNOSIS — E785 Hyperlipidemia, unspecified: Secondary | ICD-10-CM | POA: Diagnosis not present

## 2014-01-01 DIAGNOSIS — E1129 Type 2 diabetes mellitus with other diabetic kidney complication: Secondary | ICD-10-CM | POA: Diagnosis not present

## 2014-01-01 DIAGNOSIS — I10 Essential (primary) hypertension: Secondary | ICD-10-CM | POA: Diagnosis not present

## 2014-01-14 DIAGNOSIS — Z23 Encounter for immunization: Secondary | ICD-10-CM | POA: Diagnosis not present

## 2014-01-17 DIAGNOSIS — IMO0002 Reserved for concepts with insufficient information to code with codable children: Secondary | ICD-10-CM | POA: Diagnosis not present

## 2014-01-30 DIAGNOSIS — E119 Type 2 diabetes mellitus without complications: Secondary | ICD-10-CM | POA: Diagnosis not present

## 2014-01-30 DIAGNOSIS — L89892 Pressure ulcer of other site, stage 2: Secondary | ICD-10-CM | POA: Diagnosis not present

## 2014-01-30 DIAGNOSIS — Z89511 Acquired absence of right leg below knee: Secondary | ICD-10-CM | POA: Diagnosis not present

## 2014-01-30 DIAGNOSIS — T8789 Other complications of amputation stump: Secondary | ICD-10-CM | POA: Diagnosis not present

## 2014-01-31 DIAGNOSIS — T8740 Infection of amputation stump, unspecified extremity: Secondary | ICD-10-CM | POA: Diagnosis not present

## 2014-02-07 DIAGNOSIS — L89892 Pressure ulcer of other site, stage 2: Secondary | ICD-10-CM | POA: Diagnosis not present

## 2014-02-07 DIAGNOSIS — Z89511 Acquired absence of right leg below knee: Secondary | ICD-10-CM | POA: Diagnosis not present

## 2014-02-07 DIAGNOSIS — T8789 Other complications of amputation stump: Secondary | ICD-10-CM | POA: Diagnosis not present

## 2014-02-07 DIAGNOSIS — E119 Type 2 diabetes mellitus without complications: Secondary | ICD-10-CM | POA: Diagnosis not present

## 2014-02-12 DIAGNOSIS — E1142 Type 2 diabetes mellitus with diabetic polyneuropathy: Secondary | ICD-10-CM | POA: Diagnosis not present

## 2014-02-12 DIAGNOSIS — E1140 Type 2 diabetes mellitus with diabetic neuropathy, unspecified: Secondary | ICD-10-CM | POA: Diagnosis not present

## 2014-03-14 DIAGNOSIS — R809 Proteinuria, unspecified: Secondary | ICD-10-CM | POA: Diagnosis not present

## 2014-03-14 DIAGNOSIS — E559 Vitamin D deficiency, unspecified: Secondary | ICD-10-CM | POA: Diagnosis not present

## 2014-03-14 DIAGNOSIS — E875 Hyperkalemia: Secondary | ICD-10-CM | POA: Diagnosis not present

## 2014-03-14 DIAGNOSIS — Z79899 Other long term (current) drug therapy: Secondary | ICD-10-CM | POA: Diagnosis not present

## 2014-03-14 DIAGNOSIS — N183 Chronic kidney disease, stage 3 (moderate): Secondary | ICD-10-CM | POA: Diagnosis not present

## 2014-03-14 DIAGNOSIS — E1129 Type 2 diabetes mellitus with other diabetic kidney complication: Secondary | ICD-10-CM | POA: Diagnosis not present

## 2014-03-14 DIAGNOSIS — D509 Iron deficiency anemia, unspecified: Secondary | ICD-10-CM | POA: Diagnosis not present

## 2014-03-25 DIAGNOSIS — I509 Heart failure, unspecified: Secondary | ICD-10-CM | POA: Diagnosis not present

## 2014-03-25 DIAGNOSIS — D509 Iron deficiency anemia, unspecified: Secondary | ICD-10-CM | POA: Diagnosis not present

## 2014-03-25 DIAGNOSIS — E875 Hyperkalemia: Secondary | ICD-10-CM | POA: Diagnosis not present

## 2014-03-25 DIAGNOSIS — N183 Chronic kidney disease, stage 3 (moderate): Secondary | ICD-10-CM | POA: Diagnosis not present

## 2014-03-25 DIAGNOSIS — R809 Proteinuria, unspecified: Secondary | ICD-10-CM | POA: Diagnosis not present

## 2014-03-25 DIAGNOSIS — I10 Essential (primary) hypertension: Secondary | ICD-10-CM | POA: Diagnosis not present

## 2014-04-23 DIAGNOSIS — E1142 Type 2 diabetes mellitus with diabetic polyneuropathy: Secondary | ICD-10-CM | POA: Diagnosis not present

## 2014-04-23 DIAGNOSIS — E1140 Type 2 diabetes mellitus with diabetic neuropathy, unspecified: Secondary | ICD-10-CM | POA: Diagnosis not present

## 2014-05-08 DIAGNOSIS — E119 Type 2 diabetes mellitus without complications: Secondary | ICD-10-CM | POA: Diagnosis not present

## 2014-05-08 DIAGNOSIS — I10 Essential (primary) hypertension: Secondary | ICD-10-CM | POA: Diagnosis not present

## 2014-05-10 DIAGNOSIS — I10 Essential (primary) hypertension: Secondary | ICD-10-CM | POA: Diagnosis not present

## 2014-05-10 DIAGNOSIS — E782 Mixed hyperlipidemia: Secondary | ICD-10-CM | POA: Diagnosis not present

## 2014-05-10 DIAGNOSIS — N183 Chronic kidney disease, stage 3 (moderate): Secondary | ICD-10-CM | POA: Diagnosis not present

## 2014-05-10 DIAGNOSIS — E1122 Type 2 diabetes mellitus with diabetic chronic kidney disease: Secondary | ICD-10-CM | POA: Diagnosis not present

## 2014-07-02 DIAGNOSIS — E1140 Type 2 diabetes mellitus with diabetic neuropathy, unspecified: Secondary | ICD-10-CM | POA: Diagnosis not present

## 2014-07-02 DIAGNOSIS — E1142 Type 2 diabetes mellitus with diabetic polyneuropathy: Secondary | ICD-10-CM | POA: Diagnosis not present

## 2014-07-10 DIAGNOSIS — D485 Neoplasm of uncertain behavior of skin: Secondary | ICD-10-CM | POA: Diagnosis not present

## 2014-07-10 DIAGNOSIS — L570 Actinic keratosis: Secondary | ICD-10-CM | POA: Diagnosis not present

## 2014-07-10 DIAGNOSIS — L820 Inflamed seborrheic keratosis: Secondary | ICD-10-CM | POA: Diagnosis not present

## 2014-07-10 DIAGNOSIS — Z85828 Personal history of other malignant neoplasm of skin: Secondary | ICD-10-CM | POA: Diagnosis not present

## 2014-07-10 DIAGNOSIS — C4441 Basal cell carcinoma of skin of scalp and neck: Secondary | ICD-10-CM | POA: Diagnosis not present

## 2014-07-10 DIAGNOSIS — D044 Carcinoma in situ of skin of scalp and neck: Secondary | ICD-10-CM | POA: Diagnosis not present

## 2014-07-16 DIAGNOSIS — R809 Proteinuria, unspecified: Secondary | ICD-10-CM | POA: Diagnosis not present

## 2014-07-16 DIAGNOSIS — I10 Essential (primary) hypertension: Secondary | ICD-10-CM | POA: Diagnosis not present

## 2014-07-16 DIAGNOSIS — E559 Vitamin D deficiency, unspecified: Secondary | ICD-10-CM | POA: Diagnosis not present

## 2014-07-16 DIAGNOSIS — N183 Chronic kidney disease, stage 3 (moderate): Secondary | ICD-10-CM | POA: Diagnosis not present

## 2014-07-16 DIAGNOSIS — Z79899 Other long term (current) drug therapy: Secondary | ICD-10-CM | POA: Diagnosis not present

## 2014-07-16 DIAGNOSIS — D509 Iron deficiency anemia, unspecified: Secondary | ICD-10-CM | POA: Diagnosis not present

## 2014-07-23 DIAGNOSIS — I10 Essential (primary) hypertension: Secondary | ICD-10-CM | POA: Diagnosis not present

## 2014-07-23 DIAGNOSIS — R809 Proteinuria, unspecified: Secondary | ICD-10-CM | POA: Diagnosis not present

## 2014-07-23 DIAGNOSIS — I509 Heart failure, unspecified: Secondary | ICD-10-CM | POA: Diagnosis not present

## 2014-07-23 DIAGNOSIS — N184 Chronic kidney disease, stage 4 (severe): Secondary | ICD-10-CM | POA: Diagnosis not present

## 2014-07-25 DIAGNOSIS — C4442 Squamous cell carcinoma of skin of scalp and neck: Secondary | ICD-10-CM | POA: Diagnosis not present

## 2014-07-25 DIAGNOSIS — C44320 Squamous cell carcinoma of skin of unspecified parts of face: Secondary | ICD-10-CM | POA: Diagnosis not present

## 2014-08-08 DIAGNOSIS — L570 Actinic keratosis: Secondary | ICD-10-CM | POA: Diagnosis not present

## 2014-08-08 DIAGNOSIS — C4441 Basal cell carcinoma of skin of scalp and neck: Secondary | ICD-10-CM | POA: Diagnosis not present

## 2014-08-27 DIAGNOSIS — E1122 Type 2 diabetes mellitus with diabetic chronic kidney disease: Secondary | ICD-10-CM | POA: Diagnosis not present

## 2014-08-27 DIAGNOSIS — E782 Mixed hyperlipidemia: Secondary | ICD-10-CM | POA: Diagnosis not present

## 2014-08-27 DIAGNOSIS — I10 Essential (primary) hypertension: Secondary | ICD-10-CM | POA: Diagnosis not present

## 2014-08-29 DIAGNOSIS — I10 Essential (primary) hypertension: Secondary | ICD-10-CM | POA: Diagnosis not present

## 2014-08-29 DIAGNOSIS — N184 Chronic kidney disease, stage 4 (severe): Secondary | ICD-10-CM | POA: Diagnosis not present

## 2014-08-29 DIAGNOSIS — S88011D Complete traumatic amputation at knee level, right lower leg, subsequent encounter: Secondary | ICD-10-CM | POA: Diagnosis not present

## 2014-08-29 DIAGNOSIS — E1122 Type 2 diabetes mellitus with diabetic chronic kidney disease: Secondary | ICD-10-CM | POA: Diagnosis not present

## 2014-09-03 DIAGNOSIS — H4720 Unspecified optic atrophy: Secondary | ICD-10-CM | POA: Diagnosis not present

## 2014-09-03 DIAGNOSIS — H35363 Drusen (degenerative) of macula, bilateral: Secondary | ICD-10-CM | POA: Diagnosis not present

## 2014-09-03 DIAGNOSIS — H35373 Puckering of macula, bilateral: Secondary | ICD-10-CM | POA: Diagnosis not present

## 2014-09-03 DIAGNOSIS — E11359 Type 2 diabetes mellitus with proliferative diabetic retinopathy without macular edema: Secondary | ICD-10-CM | POA: Diagnosis not present

## 2014-09-10 DIAGNOSIS — E1140 Type 2 diabetes mellitus with diabetic neuropathy, unspecified: Secondary | ICD-10-CM | POA: Diagnosis not present

## 2014-09-10 DIAGNOSIS — E1142 Type 2 diabetes mellitus with diabetic polyneuropathy: Secondary | ICD-10-CM | POA: Diagnosis not present

## 2014-11-12 DIAGNOSIS — N183 Chronic kidney disease, stage 3 (moderate): Secondary | ICD-10-CM | POA: Diagnosis not present

## 2014-11-12 DIAGNOSIS — E559 Vitamin D deficiency, unspecified: Secondary | ICD-10-CM | POA: Diagnosis not present

## 2014-11-12 DIAGNOSIS — R809 Proteinuria, unspecified: Secondary | ICD-10-CM | POA: Diagnosis not present

## 2014-11-12 DIAGNOSIS — I10 Essential (primary) hypertension: Secondary | ICD-10-CM | POA: Diagnosis not present

## 2014-11-12 DIAGNOSIS — D509 Iron deficiency anemia, unspecified: Secondary | ICD-10-CM | POA: Diagnosis not present

## 2014-11-12 DIAGNOSIS — Z79899 Other long term (current) drug therapy: Secondary | ICD-10-CM | POA: Diagnosis not present

## 2014-11-18 DIAGNOSIS — I10 Essential (primary) hypertension: Secondary | ICD-10-CM | POA: Diagnosis not present

## 2014-11-18 DIAGNOSIS — N184 Chronic kidney disease, stage 4 (severe): Secondary | ICD-10-CM | POA: Diagnosis not present

## 2014-11-18 DIAGNOSIS — D509 Iron deficiency anemia, unspecified: Secondary | ICD-10-CM | POA: Diagnosis not present

## 2014-11-18 DIAGNOSIS — I509 Heart failure, unspecified: Secondary | ICD-10-CM | POA: Diagnosis not present

## 2014-11-18 DIAGNOSIS — R809 Proteinuria, unspecified: Secondary | ICD-10-CM | POA: Diagnosis not present

## 2014-12-03 DIAGNOSIS — E1142 Type 2 diabetes mellitus with diabetic polyneuropathy: Secondary | ICD-10-CM | POA: Diagnosis not present

## 2014-12-03 DIAGNOSIS — E1140 Type 2 diabetes mellitus with diabetic neuropathy, unspecified: Secondary | ICD-10-CM | POA: Diagnosis not present

## 2015-01-09 DIAGNOSIS — Z23 Encounter for immunization: Secondary | ICD-10-CM | POA: Diagnosis not present

## 2015-02-11 DIAGNOSIS — E1142 Type 2 diabetes mellitus with diabetic polyneuropathy: Secondary | ICD-10-CM | POA: Diagnosis not present

## 2015-02-11 DIAGNOSIS — E1140 Type 2 diabetes mellitus with diabetic neuropathy, unspecified: Secondary | ICD-10-CM | POA: Diagnosis not present

## 2015-02-12 DIAGNOSIS — E1122 Type 2 diabetes mellitus with diabetic chronic kidney disease: Secondary | ICD-10-CM | POA: Diagnosis not present

## 2015-02-12 DIAGNOSIS — Z125 Encounter for screening for malignant neoplasm of prostate: Secondary | ICD-10-CM | POA: Diagnosis not present

## 2015-02-13 DIAGNOSIS — N184 Chronic kidney disease, stage 4 (severe): Secondary | ICD-10-CM | POA: Diagnosis not present

## 2015-02-13 DIAGNOSIS — E1122 Type 2 diabetes mellitus with diabetic chronic kidney disease: Secondary | ICD-10-CM | POA: Diagnosis not present

## 2015-02-13 DIAGNOSIS — Z23 Encounter for immunization: Secondary | ICD-10-CM | POA: Diagnosis not present

## 2015-02-13 DIAGNOSIS — I10 Essential (primary) hypertension: Secondary | ICD-10-CM | POA: Diagnosis not present

## 2015-02-13 DIAGNOSIS — E785 Hyperlipidemia, unspecified: Secondary | ICD-10-CM | POA: Diagnosis not present

## 2015-02-21 DIAGNOSIS — N183 Chronic kidney disease, stage 3 (moderate): Secondary | ICD-10-CM | POA: Diagnosis not present

## 2015-02-21 DIAGNOSIS — D649 Anemia, unspecified: Secondary | ICD-10-CM | POA: Diagnosis not present

## 2015-02-21 DIAGNOSIS — Z79899 Other long term (current) drug therapy: Secondary | ICD-10-CM | POA: Diagnosis not present

## 2015-02-21 DIAGNOSIS — R809 Proteinuria, unspecified: Secondary | ICD-10-CM | POA: Diagnosis not present

## 2015-02-21 DIAGNOSIS — E559 Vitamin D deficiency, unspecified: Secondary | ICD-10-CM | POA: Diagnosis not present

## 2015-02-21 DIAGNOSIS — I10 Essential (primary) hypertension: Secondary | ICD-10-CM | POA: Diagnosis not present

## 2015-03-04 DIAGNOSIS — I509 Heart failure, unspecified: Secondary | ICD-10-CM | POA: Diagnosis not present

## 2015-03-04 DIAGNOSIS — I10 Essential (primary) hypertension: Secondary | ICD-10-CM | POA: Diagnosis not present

## 2015-03-04 DIAGNOSIS — N183 Chronic kidney disease, stage 3 (moderate): Secondary | ICD-10-CM | POA: Diagnosis not present

## 2015-03-04 DIAGNOSIS — D500 Iron deficiency anemia secondary to blood loss (chronic): Secondary | ICD-10-CM | POA: Diagnosis not present

## 2015-03-31 DIAGNOSIS — L570 Actinic keratosis: Secondary | ICD-10-CM | POA: Diagnosis not present

## 2015-03-31 DIAGNOSIS — Z85828 Personal history of other malignant neoplasm of skin: Secondary | ICD-10-CM | POA: Diagnosis not present

## 2015-03-31 DIAGNOSIS — C44329 Squamous cell carcinoma of skin of other parts of face: Secondary | ICD-10-CM | POA: Diagnosis not present

## 2015-03-31 DIAGNOSIS — D485 Neoplasm of uncertain behavior of skin: Secondary | ICD-10-CM | POA: Diagnosis not present

## 2015-04-24 DIAGNOSIS — C44320 Squamous cell carcinoma of skin of unspecified parts of face: Secondary | ICD-10-CM | POA: Diagnosis not present

## 2015-04-29 DIAGNOSIS — E1142 Type 2 diabetes mellitus with diabetic polyneuropathy: Secondary | ICD-10-CM | POA: Diagnosis not present

## 2015-04-29 DIAGNOSIS — E1140 Type 2 diabetes mellitus with diabetic neuropathy, unspecified: Secondary | ICD-10-CM | POA: Diagnosis not present

## 2015-06-03 DIAGNOSIS — H35043 Retinal micro-aneurysms, unspecified, bilateral: Secondary | ICD-10-CM | POA: Diagnosis not present

## 2015-06-03 DIAGNOSIS — E113553 Type 2 diabetes mellitus with stable proliferative diabetic retinopathy, bilateral: Secondary | ICD-10-CM | POA: Diagnosis not present

## 2015-06-03 DIAGNOSIS — H35373 Puckering of macula, bilateral: Secondary | ICD-10-CM | POA: Diagnosis not present

## 2015-06-03 DIAGNOSIS — H31009 Unspecified chorioretinal scars, unspecified eye: Secondary | ICD-10-CM | POA: Diagnosis not present

## 2015-06-03 DIAGNOSIS — H4720 Unspecified optic atrophy: Secondary | ICD-10-CM | POA: Diagnosis not present

## 2015-06-06 DIAGNOSIS — E1122 Type 2 diabetes mellitus with diabetic chronic kidney disease: Secondary | ICD-10-CM | POA: Diagnosis not present

## 2015-06-09 DIAGNOSIS — I10 Essential (primary) hypertension: Secondary | ICD-10-CM | POA: Diagnosis not present

## 2015-06-09 DIAGNOSIS — E1122 Type 2 diabetes mellitus with diabetic chronic kidney disease: Secondary | ICD-10-CM | POA: Diagnosis not present

## 2015-06-09 DIAGNOSIS — N184 Chronic kidney disease, stage 4 (severe): Secondary | ICD-10-CM | POA: Diagnosis not present

## 2015-06-09 DIAGNOSIS — E782 Mixed hyperlipidemia: Secondary | ICD-10-CM | POA: Diagnosis not present

## 2015-07-03 DIAGNOSIS — I10 Essential (primary) hypertension: Secondary | ICD-10-CM | POA: Diagnosis not present

## 2015-07-03 DIAGNOSIS — Z79899 Other long term (current) drug therapy: Secondary | ICD-10-CM | POA: Diagnosis not present

## 2015-07-03 DIAGNOSIS — D509 Iron deficiency anemia, unspecified: Secondary | ICD-10-CM | POA: Diagnosis not present

## 2015-07-03 DIAGNOSIS — N183 Chronic kidney disease, stage 3 (moderate): Secondary | ICD-10-CM | POA: Diagnosis not present

## 2015-07-03 DIAGNOSIS — E559 Vitamin D deficiency, unspecified: Secondary | ICD-10-CM | POA: Diagnosis not present

## 2015-07-03 DIAGNOSIS — R809 Proteinuria, unspecified: Secondary | ICD-10-CM | POA: Diagnosis not present

## 2015-07-08 DIAGNOSIS — I10 Essential (primary) hypertension: Secondary | ICD-10-CM | POA: Diagnosis not present

## 2015-07-08 DIAGNOSIS — R809 Proteinuria, unspecified: Secondary | ICD-10-CM | POA: Diagnosis not present

## 2015-07-08 DIAGNOSIS — E1142 Type 2 diabetes mellitus with diabetic polyneuropathy: Secondary | ICD-10-CM | POA: Diagnosis not present

## 2015-07-08 DIAGNOSIS — E1140 Type 2 diabetes mellitus with diabetic neuropathy, unspecified: Secondary | ICD-10-CM | POA: Diagnosis not present

## 2015-07-08 DIAGNOSIS — E1129 Type 2 diabetes mellitus with other diabetic kidney complication: Secondary | ICD-10-CM | POA: Diagnosis not present

## 2015-07-08 DIAGNOSIS — D509 Iron deficiency anemia, unspecified: Secondary | ICD-10-CM | POA: Diagnosis not present

## 2015-07-08 DIAGNOSIS — E872 Acidosis: Secondary | ICD-10-CM | POA: Diagnosis not present

## 2015-07-08 DIAGNOSIS — I509 Heart failure, unspecified: Secondary | ICD-10-CM | POA: Diagnosis not present

## 2015-07-08 DIAGNOSIS — N183 Chronic kidney disease, stage 3 (moderate): Secondary | ICD-10-CM | POA: Diagnosis not present

## 2015-09-23 DIAGNOSIS — E1142 Type 2 diabetes mellitus with diabetic polyneuropathy: Secondary | ICD-10-CM | POA: Diagnosis not present

## 2015-09-23 DIAGNOSIS — E1140 Type 2 diabetes mellitus with diabetic neuropathy, unspecified: Secondary | ICD-10-CM | POA: Diagnosis not present

## 2015-09-29 DIAGNOSIS — L570 Actinic keratosis: Secondary | ICD-10-CM | POA: Diagnosis not present

## 2015-11-11 DIAGNOSIS — R809 Proteinuria, unspecified: Secondary | ICD-10-CM | POA: Diagnosis not present

## 2015-11-11 DIAGNOSIS — I10 Essential (primary) hypertension: Secondary | ICD-10-CM | POA: Diagnosis not present

## 2015-11-11 DIAGNOSIS — E559 Vitamin D deficiency, unspecified: Secondary | ICD-10-CM | POA: Diagnosis not present

## 2015-11-11 DIAGNOSIS — N183 Chronic kidney disease, stage 3 (moderate): Secondary | ICD-10-CM | POA: Diagnosis not present

## 2015-11-11 DIAGNOSIS — D509 Iron deficiency anemia, unspecified: Secondary | ICD-10-CM | POA: Diagnosis not present

## 2015-11-11 DIAGNOSIS — Z79899 Other long term (current) drug therapy: Secondary | ICD-10-CM | POA: Diagnosis not present

## 2015-11-18 DIAGNOSIS — I509 Heart failure, unspecified: Secondary | ICD-10-CM | POA: Diagnosis not present

## 2015-11-18 DIAGNOSIS — R809 Proteinuria, unspecified: Secondary | ICD-10-CM | POA: Diagnosis not present

## 2015-11-18 DIAGNOSIS — N184 Chronic kidney disease, stage 4 (severe): Secondary | ICD-10-CM | POA: Diagnosis not present

## 2015-11-18 DIAGNOSIS — I10 Essential (primary) hypertension: Secondary | ICD-10-CM | POA: Diagnosis not present

## 2015-12-04 DIAGNOSIS — E1122 Type 2 diabetes mellitus with diabetic chronic kidney disease: Secondary | ICD-10-CM | POA: Diagnosis not present

## 2015-12-04 DIAGNOSIS — E782 Mixed hyperlipidemia: Secondary | ICD-10-CM | POA: Diagnosis not present

## 2015-12-09 DIAGNOSIS — I10 Essential (primary) hypertension: Secondary | ICD-10-CM | POA: Diagnosis not present

## 2015-12-09 DIAGNOSIS — Z0001 Encounter for general adult medical examination with abnormal findings: Secondary | ICD-10-CM | POA: Diagnosis not present

## 2015-12-09 DIAGNOSIS — E782 Mixed hyperlipidemia: Secondary | ICD-10-CM | POA: Diagnosis not present

## 2015-12-09 DIAGNOSIS — N184 Chronic kidney disease, stage 4 (severe): Secondary | ICD-10-CM | POA: Diagnosis not present

## 2015-12-09 DIAGNOSIS — E1140 Type 2 diabetes mellitus with diabetic neuropathy, unspecified: Secondary | ICD-10-CM | POA: Diagnosis not present

## 2015-12-09 DIAGNOSIS — E1122 Type 2 diabetes mellitus with diabetic chronic kidney disease: Secondary | ICD-10-CM | POA: Diagnosis not present

## 2015-12-09 DIAGNOSIS — E1142 Type 2 diabetes mellitus with diabetic polyneuropathy: Secondary | ICD-10-CM | POA: Diagnosis not present

## 2015-12-26 ENCOUNTER — Other Ambulatory Visit: Payer: Self-pay

## 2016-01-12 DIAGNOSIS — Z23 Encounter for immunization: Secondary | ICD-10-CM | POA: Diagnosis not present

## 2016-02-17 DIAGNOSIS — I10 Essential (primary) hypertension: Secondary | ICD-10-CM | POA: Diagnosis not present

## 2016-02-17 DIAGNOSIS — Z79899 Other long term (current) drug therapy: Secondary | ICD-10-CM | POA: Diagnosis not present

## 2016-02-17 DIAGNOSIS — D509 Iron deficiency anemia, unspecified: Secondary | ICD-10-CM | POA: Diagnosis not present

## 2016-02-17 DIAGNOSIS — E1140 Type 2 diabetes mellitus with diabetic neuropathy, unspecified: Secondary | ICD-10-CM | POA: Diagnosis not present

## 2016-02-17 DIAGNOSIS — E559 Vitamin D deficiency, unspecified: Secondary | ICD-10-CM | POA: Diagnosis not present

## 2016-02-17 DIAGNOSIS — E1142 Type 2 diabetes mellitus with diabetic polyneuropathy: Secondary | ICD-10-CM | POA: Diagnosis not present

## 2016-02-17 DIAGNOSIS — R809 Proteinuria, unspecified: Secondary | ICD-10-CM | POA: Diagnosis not present

## 2016-02-17 DIAGNOSIS — N183 Chronic kidney disease, stage 3 (moderate): Secondary | ICD-10-CM | POA: Diagnosis not present

## 2016-02-24 DIAGNOSIS — E1129 Type 2 diabetes mellitus with other diabetic kidney complication: Secondary | ICD-10-CM | POA: Diagnosis not present

## 2016-02-24 DIAGNOSIS — I10 Essential (primary) hypertension: Secondary | ICD-10-CM | POA: Diagnosis not present

## 2016-02-24 DIAGNOSIS — R809 Proteinuria, unspecified: Secondary | ICD-10-CM | POA: Diagnosis not present

## 2016-02-24 DIAGNOSIS — E872 Acidosis: Secondary | ICD-10-CM | POA: Diagnosis not present

## 2016-02-24 DIAGNOSIS — I509 Heart failure, unspecified: Secondary | ICD-10-CM | POA: Diagnosis not present

## 2016-02-24 DIAGNOSIS — D649 Anemia, unspecified: Secondary | ICD-10-CM | POA: Diagnosis not present

## 2016-02-24 DIAGNOSIS — N184 Chronic kidney disease, stage 4 (severe): Secondary | ICD-10-CM | POA: Diagnosis not present

## 2016-03-04 DIAGNOSIS — H4720 Unspecified optic atrophy: Secondary | ICD-10-CM | POA: Diagnosis not present

## 2016-03-04 DIAGNOSIS — H35373 Puckering of macula, bilateral: Secondary | ICD-10-CM | POA: Diagnosis not present

## 2016-03-04 DIAGNOSIS — E113553 Type 2 diabetes mellitus with stable proliferative diabetic retinopathy, bilateral: Secondary | ICD-10-CM | POA: Diagnosis not present

## 2016-03-04 DIAGNOSIS — H35363 Drusen (degenerative) of macula, bilateral: Secondary | ICD-10-CM | POA: Diagnosis not present

## 2016-05-04 DIAGNOSIS — E1140 Type 2 diabetes mellitus with diabetic neuropathy, unspecified: Secondary | ICD-10-CM | POA: Diagnosis not present

## 2016-05-04 DIAGNOSIS — E1142 Type 2 diabetes mellitus with diabetic polyneuropathy: Secondary | ICD-10-CM | POA: Diagnosis not present

## 2016-05-19 DIAGNOSIS — L570 Actinic keratosis: Secondary | ICD-10-CM | POA: Diagnosis not present

## 2016-05-19 DIAGNOSIS — Z85828 Personal history of other malignant neoplasm of skin: Secondary | ICD-10-CM | POA: Diagnosis not present

## 2016-06-29 DIAGNOSIS — Z79899 Other long term (current) drug therapy: Secondary | ICD-10-CM | POA: Diagnosis not present

## 2016-06-29 DIAGNOSIS — E559 Vitamin D deficiency, unspecified: Secondary | ICD-10-CM | POA: Diagnosis not present

## 2016-06-29 DIAGNOSIS — I10 Essential (primary) hypertension: Secondary | ICD-10-CM | POA: Diagnosis not present

## 2016-06-29 DIAGNOSIS — N183 Chronic kidney disease, stage 3 (moderate): Secondary | ICD-10-CM | POA: Diagnosis not present

## 2016-06-29 DIAGNOSIS — R809 Proteinuria, unspecified: Secondary | ICD-10-CM | POA: Diagnosis not present

## 2016-06-29 DIAGNOSIS — D509 Iron deficiency anemia, unspecified: Secondary | ICD-10-CM | POA: Diagnosis not present

## 2016-07-06 DIAGNOSIS — I509 Heart failure, unspecified: Secondary | ICD-10-CM | POA: Diagnosis not present

## 2016-07-06 DIAGNOSIS — D649 Anemia, unspecified: Secondary | ICD-10-CM | POA: Diagnosis not present

## 2016-07-06 DIAGNOSIS — R809 Proteinuria, unspecified: Secondary | ICD-10-CM | POA: Diagnosis not present

## 2016-07-06 DIAGNOSIS — N183 Chronic kidney disease, stage 3 (moderate): Secondary | ICD-10-CM | POA: Diagnosis not present

## 2016-07-06 DIAGNOSIS — E872 Acidosis: Secondary | ICD-10-CM | POA: Diagnosis not present

## 2016-07-06 DIAGNOSIS — E1129 Type 2 diabetes mellitus with other diabetic kidney complication: Secondary | ICD-10-CM | POA: Diagnosis not present

## 2016-07-06 DIAGNOSIS — I10 Essential (primary) hypertension: Secondary | ICD-10-CM | POA: Diagnosis not present

## 2016-07-12 DIAGNOSIS — J060 Acute laryngopharyngitis: Secondary | ICD-10-CM | POA: Diagnosis not present

## 2016-07-12 DIAGNOSIS — Z6825 Body mass index (BMI) 25.0-25.9, adult: Secondary | ICD-10-CM | POA: Diagnosis not present

## 2016-07-12 DIAGNOSIS — R05 Cough: Secondary | ICD-10-CM | POA: Diagnosis not present

## 2016-07-13 DIAGNOSIS — E1140 Type 2 diabetes mellitus with diabetic neuropathy, unspecified: Secondary | ICD-10-CM | POA: Diagnosis not present

## 2016-07-13 DIAGNOSIS — E1142 Type 2 diabetes mellitus with diabetic polyneuropathy: Secondary | ICD-10-CM | POA: Diagnosis not present

## 2016-09-28 DIAGNOSIS — E1140 Type 2 diabetes mellitus with diabetic neuropathy, unspecified: Secondary | ICD-10-CM | POA: Diagnosis not present

## 2016-09-28 DIAGNOSIS — E1151 Type 2 diabetes mellitus with diabetic peripheral angiopathy without gangrene: Secondary | ICD-10-CM | POA: Diagnosis not present

## 2016-11-11 DIAGNOSIS — N183 Chronic kidney disease, stage 3 (moderate): Secondary | ICD-10-CM | POA: Diagnosis not present

## 2016-11-11 DIAGNOSIS — I10 Essential (primary) hypertension: Secondary | ICD-10-CM | POA: Diagnosis not present

## 2016-11-11 DIAGNOSIS — R809 Proteinuria, unspecified: Secondary | ICD-10-CM | POA: Diagnosis not present

## 2016-11-11 DIAGNOSIS — Z79899 Other long term (current) drug therapy: Secondary | ICD-10-CM | POA: Diagnosis not present

## 2016-11-11 DIAGNOSIS — D509 Iron deficiency anemia, unspecified: Secondary | ICD-10-CM | POA: Diagnosis not present

## 2016-11-11 DIAGNOSIS — E559 Vitamin D deficiency, unspecified: Secondary | ICD-10-CM | POA: Diagnosis not present

## 2016-11-16 DIAGNOSIS — R809 Proteinuria, unspecified: Secondary | ICD-10-CM | POA: Diagnosis not present

## 2016-11-16 DIAGNOSIS — E1129 Type 2 diabetes mellitus with other diabetic kidney complication: Secondary | ICD-10-CM | POA: Diagnosis not present

## 2016-11-16 DIAGNOSIS — E872 Acidosis: Secondary | ICD-10-CM | POA: Diagnosis not present

## 2016-11-16 DIAGNOSIS — I10 Essential (primary) hypertension: Secondary | ICD-10-CM | POA: Diagnosis not present

## 2016-11-16 DIAGNOSIS — I509 Heart failure, unspecified: Secondary | ICD-10-CM | POA: Diagnosis not present

## 2016-11-16 DIAGNOSIS — N183 Chronic kidney disease, stage 3 (moderate): Secondary | ICD-10-CM | POA: Diagnosis not present

## 2016-11-16 DIAGNOSIS — D649 Anemia, unspecified: Secondary | ICD-10-CM | POA: Diagnosis not present

## 2016-11-30 DIAGNOSIS — Z85828 Personal history of other malignant neoplasm of skin: Secondary | ICD-10-CM | POA: Diagnosis not present

## 2016-11-30 DIAGNOSIS — L570 Actinic keratosis: Secondary | ICD-10-CM | POA: Diagnosis not present

## 2016-11-30 DIAGNOSIS — L821 Other seborrheic keratosis: Secondary | ICD-10-CM | POA: Diagnosis not present

## 2016-12-09 DIAGNOSIS — H35373 Puckering of macula, bilateral: Secondary | ICD-10-CM | POA: Diagnosis not present

## 2016-12-09 DIAGNOSIS — E113553 Type 2 diabetes mellitus with stable proliferative diabetic retinopathy, bilateral: Secondary | ICD-10-CM | POA: Diagnosis not present

## 2016-12-09 DIAGNOSIS — H4720 Unspecified optic atrophy: Secondary | ICD-10-CM | POA: Diagnosis not present

## 2016-12-09 DIAGNOSIS — H35363 Drusen (degenerative) of macula, bilateral: Secondary | ICD-10-CM | POA: Diagnosis not present

## 2016-12-14 DIAGNOSIS — E1151 Type 2 diabetes mellitus with diabetic peripheral angiopathy without gangrene: Secondary | ICD-10-CM | POA: Diagnosis not present

## 2016-12-14 DIAGNOSIS — E1140 Type 2 diabetes mellitus with diabetic neuropathy, unspecified: Secondary | ICD-10-CM | POA: Diagnosis not present

## 2017-01-24 DIAGNOSIS — Z23 Encounter for immunization: Secondary | ICD-10-CM | POA: Diagnosis not present

## 2017-02-11 DIAGNOSIS — I10 Essential (primary) hypertension: Secondary | ICD-10-CM | POA: Diagnosis not present

## 2017-02-11 DIAGNOSIS — E1122 Type 2 diabetes mellitus with diabetic chronic kidney disease: Secondary | ICD-10-CM | POA: Diagnosis not present

## 2017-02-11 DIAGNOSIS — N184 Chronic kidney disease, stage 4 (severe): Secondary | ICD-10-CM | POA: Diagnosis not present

## 2017-02-11 DIAGNOSIS — E782 Mixed hyperlipidemia: Secondary | ICD-10-CM | POA: Diagnosis not present

## 2017-02-11 DIAGNOSIS — E119 Type 2 diabetes mellitus without complications: Secondary | ICD-10-CM | POA: Diagnosis not present

## 2017-02-22 DIAGNOSIS — E1151 Type 2 diabetes mellitus with diabetic peripheral angiopathy without gangrene: Secondary | ICD-10-CM | POA: Diagnosis not present

## 2017-02-22 DIAGNOSIS — E1140 Type 2 diabetes mellitus with diabetic neuropathy, unspecified: Secondary | ICD-10-CM | POA: Diagnosis not present

## 2017-03-15 DIAGNOSIS — E559 Vitamin D deficiency, unspecified: Secondary | ICD-10-CM | POA: Diagnosis not present

## 2017-03-15 DIAGNOSIS — I10 Essential (primary) hypertension: Secondary | ICD-10-CM | POA: Diagnosis not present

## 2017-03-15 DIAGNOSIS — D649 Anemia, unspecified: Secondary | ICD-10-CM | POA: Diagnosis not present

## 2017-03-15 DIAGNOSIS — I509 Heart failure, unspecified: Secondary | ICD-10-CM | POA: Diagnosis not present

## 2017-03-15 DIAGNOSIS — R809 Proteinuria, unspecified: Secondary | ICD-10-CM | POA: Diagnosis not present

## 2017-03-15 DIAGNOSIS — Z79899 Other long term (current) drug therapy: Secondary | ICD-10-CM | POA: Diagnosis not present

## 2017-03-15 DIAGNOSIS — N184 Chronic kidney disease, stage 4 (severe): Secondary | ICD-10-CM | POA: Diagnosis not present

## 2017-03-22 DIAGNOSIS — I10 Essential (primary) hypertension: Secondary | ICD-10-CM | POA: Diagnosis not present

## 2017-03-22 DIAGNOSIS — E1129 Type 2 diabetes mellitus with other diabetic kidney complication: Secondary | ICD-10-CM | POA: Diagnosis not present

## 2017-03-22 DIAGNOSIS — I509 Heart failure, unspecified: Secondary | ICD-10-CM | POA: Diagnosis not present

## 2017-03-22 DIAGNOSIS — N184 Chronic kidney disease, stage 4 (severe): Secondary | ICD-10-CM | POA: Diagnosis not present

## 2017-03-22 DIAGNOSIS — E872 Acidosis: Secondary | ICD-10-CM | POA: Diagnosis not present

## 2017-06-01 DIAGNOSIS — L570 Actinic keratosis: Secondary | ICD-10-CM | POA: Diagnosis not present

## 2017-06-13 DIAGNOSIS — E1151 Type 2 diabetes mellitus with diabetic peripheral angiopathy without gangrene: Secondary | ICD-10-CM | POA: Diagnosis not present

## 2017-06-13 DIAGNOSIS — E1140 Type 2 diabetes mellitus with diabetic neuropathy, unspecified: Secondary | ICD-10-CM | POA: Diagnosis not present

## 2017-07-26 DIAGNOSIS — Z79899 Other long term (current) drug therapy: Secondary | ICD-10-CM | POA: Diagnosis not present

## 2017-07-26 DIAGNOSIS — D509 Iron deficiency anemia, unspecified: Secondary | ICD-10-CM | POA: Diagnosis not present

## 2017-07-26 DIAGNOSIS — R809 Proteinuria, unspecified: Secondary | ICD-10-CM | POA: Diagnosis not present

## 2017-07-26 DIAGNOSIS — E559 Vitamin D deficiency, unspecified: Secondary | ICD-10-CM | POA: Diagnosis not present

## 2017-07-26 DIAGNOSIS — N183 Chronic kidney disease, stage 3 (moderate): Secondary | ICD-10-CM | POA: Diagnosis not present

## 2017-07-26 DIAGNOSIS — I10 Essential (primary) hypertension: Secondary | ICD-10-CM | POA: Diagnosis not present

## 2017-08-01 DIAGNOSIS — Z6823 Body mass index (BMI) 23.0-23.9, adult: Secondary | ICD-10-CM | POA: Diagnosis not present

## 2017-08-01 DIAGNOSIS — E782 Mixed hyperlipidemia: Secondary | ICD-10-CM | POA: Diagnosis not present

## 2017-08-01 DIAGNOSIS — Z0000 Encounter for general adult medical examination without abnormal findings: Secondary | ICD-10-CM | POA: Diagnosis not present

## 2017-08-01 DIAGNOSIS — N184 Chronic kidney disease, stage 4 (severe): Secondary | ICD-10-CM | POA: Diagnosis not present

## 2017-08-01 DIAGNOSIS — E1122 Type 2 diabetes mellitus with diabetic chronic kidney disease: Secondary | ICD-10-CM | POA: Diagnosis not present

## 2017-08-01 DIAGNOSIS — I10 Essential (primary) hypertension: Secondary | ICD-10-CM | POA: Diagnosis not present

## 2017-08-01 DIAGNOSIS — E119 Type 2 diabetes mellitus without complications: Secondary | ICD-10-CM | POA: Diagnosis not present

## 2017-08-02 DIAGNOSIS — I10 Essential (primary) hypertension: Secondary | ICD-10-CM | POA: Diagnosis not present

## 2017-08-02 DIAGNOSIS — E1129 Type 2 diabetes mellitus with other diabetic kidney complication: Secondary | ICD-10-CM | POA: Diagnosis not present

## 2017-08-02 DIAGNOSIS — I509 Heart failure, unspecified: Secondary | ICD-10-CM | POA: Diagnosis not present

## 2017-08-02 DIAGNOSIS — N184 Chronic kidney disease, stage 4 (severe): Secondary | ICD-10-CM | POA: Diagnosis not present

## 2017-08-29 DIAGNOSIS — E1151 Type 2 diabetes mellitus with diabetic peripheral angiopathy without gangrene: Secondary | ICD-10-CM | POA: Diagnosis not present

## 2017-08-29 DIAGNOSIS — E1140 Type 2 diabetes mellitus with diabetic neuropathy, unspecified: Secondary | ICD-10-CM | POA: Diagnosis not present

## 2017-09-08 DIAGNOSIS — H35043 Retinal micro-aneurysms, unspecified, bilateral: Secondary | ICD-10-CM | POA: Diagnosis not present

## 2017-09-08 DIAGNOSIS — E113553 Type 2 diabetes mellitus with stable proliferative diabetic retinopathy, bilateral: Secondary | ICD-10-CM | POA: Diagnosis not present

## 2017-09-08 DIAGNOSIS — H35373 Puckering of macula, bilateral: Secondary | ICD-10-CM | POA: Diagnosis not present

## 2017-09-08 DIAGNOSIS — H4720 Unspecified optic atrophy: Secondary | ICD-10-CM | POA: Diagnosis not present

## 2017-11-07 DIAGNOSIS — E1140 Type 2 diabetes mellitus with diabetic neuropathy, unspecified: Secondary | ICD-10-CM | POA: Diagnosis not present

## 2017-11-07 DIAGNOSIS — E1151 Type 2 diabetes mellitus with diabetic peripheral angiopathy without gangrene: Secondary | ICD-10-CM | POA: Diagnosis not present

## 2017-11-29 DIAGNOSIS — E559 Vitamin D deficiency, unspecified: Secondary | ICD-10-CM | POA: Diagnosis not present

## 2017-11-29 DIAGNOSIS — I10 Essential (primary) hypertension: Secondary | ICD-10-CM | POA: Diagnosis not present

## 2017-11-29 DIAGNOSIS — L570 Actinic keratosis: Secondary | ICD-10-CM | POA: Diagnosis not present

## 2017-11-29 DIAGNOSIS — R809 Proteinuria, unspecified: Secondary | ICD-10-CM | POA: Diagnosis not present

## 2017-11-29 DIAGNOSIS — D509 Iron deficiency anemia, unspecified: Secondary | ICD-10-CM | POA: Diagnosis not present

## 2017-11-29 DIAGNOSIS — N183 Chronic kidney disease, stage 3 (moderate): Secondary | ICD-10-CM | POA: Diagnosis not present

## 2017-11-29 DIAGNOSIS — Z79899 Other long term (current) drug therapy: Secondary | ICD-10-CM | POA: Diagnosis not present

## 2018-01-31 DIAGNOSIS — Z0000 Encounter for general adult medical examination without abnormal findings: Secondary | ICD-10-CM | POA: Diagnosis not present

## 2018-01-31 DIAGNOSIS — E782 Mixed hyperlipidemia: Secondary | ICD-10-CM | POA: Diagnosis not present

## 2018-01-31 DIAGNOSIS — Z23 Encounter for immunization: Secondary | ICD-10-CM | POA: Diagnosis not present

## 2018-01-31 DIAGNOSIS — N184 Chronic kidney disease, stage 4 (severe): Secondary | ICD-10-CM | POA: Diagnosis not present

## 2018-01-31 DIAGNOSIS — E1122 Type 2 diabetes mellitus with diabetic chronic kidney disease: Secondary | ICD-10-CM | POA: Diagnosis not present

## 2018-01-31 DIAGNOSIS — Z6822 Body mass index (BMI) 22.0-22.9, adult: Secondary | ICD-10-CM | POA: Diagnosis not present

## 2018-01-31 DIAGNOSIS — I10 Essential (primary) hypertension: Secondary | ICD-10-CM | POA: Diagnosis not present

## 2018-02-03 DIAGNOSIS — N184 Chronic kidney disease, stage 4 (severe): Secondary | ICD-10-CM | POA: Diagnosis not present

## 2018-02-03 DIAGNOSIS — I10 Essential (primary) hypertension: Secondary | ICD-10-CM | POA: Diagnosis not present

## 2018-02-03 DIAGNOSIS — Z89511 Acquired absence of right leg below knee: Secondary | ICD-10-CM | POA: Diagnosis not present

## 2018-02-03 DIAGNOSIS — H3530 Unspecified macular degeneration: Secondary | ICD-10-CM | POA: Diagnosis not present

## 2018-02-03 DIAGNOSIS — Z6822 Body mass index (BMI) 22.0-22.9, adult: Secondary | ICD-10-CM | POA: Diagnosis not present

## 2018-02-03 DIAGNOSIS — E782 Mixed hyperlipidemia: Secondary | ICD-10-CM | POA: Diagnosis not present

## 2018-02-03 DIAGNOSIS — E1122 Type 2 diabetes mellitus with diabetic chronic kidney disease: Secondary | ICD-10-CM | POA: Diagnosis not present

## 2018-02-03 DIAGNOSIS — E46 Unspecified protein-calorie malnutrition: Secondary | ICD-10-CM | POA: Diagnosis not present

## 2018-02-06 DIAGNOSIS — E1151 Type 2 diabetes mellitus with diabetic peripheral angiopathy without gangrene: Secondary | ICD-10-CM | POA: Diagnosis not present

## 2018-02-06 DIAGNOSIS — E1140 Type 2 diabetes mellitus with diabetic neuropathy, unspecified: Secondary | ICD-10-CM | POA: Diagnosis not present

## 2018-03-28 DIAGNOSIS — I10 Essential (primary) hypertension: Secondary | ICD-10-CM | POA: Diagnosis not present

## 2018-03-28 DIAGNOSIS — Z79899 Other long term (current) drug therapy: Secondary | ICD-10-CM | POA: Diagnosis not present

## 2018-03-28 DIAGNOSIS — E559 Vitamin D deficiency, unspecified: Secondary | ICD-10-CM | POA: Diagnosis not present

## 2018-03-28 DIAGNOSIS — D509 Iron deficiency anemia, unspecified: Secondary | ICD-10-CM | POA: Diagnosis not present

## 2018-03-28 DIAGNOSIS — N183 Chronic kidney disease, stage 3 (moderate): Secondary | ICD-10-CM | POA: Diagnosis not present

## 2018-03-28 DIAGNOSIS — R809 Proteinuria, unspecified: Secondary | ICD-10-CM | POA: Diagnosis not present

## 2018-05-01 DIAGNOSIS — E1151 Type 2 diabetes mellitus with diabetic peripheral angiopathy without gangrene: Secondary | ICD-10-CM | POA: Diagnosis not present

## 2018-05-01 DIAGNOSIS — E1140 Type 2 diabetes mellitus with diabetic neuropathy, unspecified: Secondary | ICD-10-CM | POA: Diagnosis not present

## 2018-05-30 DIAGNOSIS — C44329 Squamous cell carcinoma of skin of other parts of face: Secondary | ICD-10-CM | POA: Diagnosis not present

## 2018-05-30 DIAGNOSIS — D0439 Carcinoma in situ of skin of other parts of face: Secondary | ICD-10-CM | POA: Diagnosis not present

## 2018-05-30 DIAGNOSIS — L570 Actinic keratosis: Secondary | ICD-10-CM | POA: Diagnosis not present

## 2018-06-22 DIAGNOSIS — E113552 Type 2 diabetes mellitus with stable proliferative diabetic retinopathy, left eye: Secondary | ICD-10-CM | POA: Diagnosis not present

## 2018-06-22 DIAGNOSIS — E113511 Type 2 diabetes mellitus with proliferative diabetic retinopathy with macular edema, right eye: Secondary | ICD-10-CM | POA: Diagnosis not present

## 2018-06-22 DIAGNOSIS — H353131 Nonexudative age-related macular degeneration, bilateral, early dry stage: Secondary | ICD-10-CM | POA: Diagnosis not present

## 2018-06-22 DIAGNOSIS — H4720 Unspecified optic atrophy: Secondary | ICD-10-CM | POA: Diagnosis not present

## 2018-08-10 DIAGNOSIS — H3530 Unspecified macular degeneration: Secondary | ICD-10-CM | POA: Diagnosis not present

## 2018-08-10 DIAGNOSIS — N184 Chronic kidney disease, stage 4 (severe): Secondary | ICD-10-CM | POA: Diagnosis not present

## 2018-08-10 DIAGNOSIS — E160 Drug-induced hypoglycemia without coma: Secondary | ICD-10-CM | POA: Diagnosis not present

## 2018-08-10 DIAGNOSIS — E1122 Type 2 diabetes mellitus with diabetic chronic kidney disease: Secondary | ICD-10-CM | POA: Diagnosis not present

## 2018-08-10 DIAGNOSIS — E11319 Type 2 diabetes mellitus with unspecified diabetic retinopathy without macular edema: Secondary | ICD-10-CM | POA: Diagnosis not present

## 2018-08-10 DIAGNOSIS — H35109 Retinopathy of prematurity, unspecified, unspecified eye: Secondary | ICD-10-CM | POA: Diagnosis not present

## 2018-08-10 DIAGNOSIS — I10 Essential (primary) hypertension: Secondary | ICD-10-CM | POA: Diagnosis not present

## 2018-08-10 DIAGNOSIS — Z89511 Acquired absence of right leg below knee: Secondary | ICD-10-CM | POA: Diagnosis not present

## 2018-08-10 DIAGNOSIS — E782 Mixed hyperlipidemia: Secondary | ICD-10-CM | POA: Diagnosis not present

## 2018-08-10 DIAGNOSIS — E46 Unspecified protein-calorie malnutrition: Secondary | ICD-10-CM | POA: Diagnosis not present

## 2018-09-22 DIAGNOSIS — E782 Mixed hyperlipidemia: Secondary | ICD-10-CM | POA: Diagnosis not present

## 2018-09-22 DIAGNOSIS — N184 Chronic kidney disease, stage 4 (severe): Secondary | ICD-10-CM | POA: Diagnosis not present

## 2018-09-22 DIAGNOSIS — E1122 Type 2 diabetes mellitus with diabetic chronic kidney disease: Secondary | ICD-10-CM | POA: Diagnosis not present

## 2018-09-22 DIAGNOSIS — I10 Essential (primary) hypertension: Secondary | ICD-10-CM | POA: Diagnosis not present

## 2018-09-26 DIAGNOSIS — H35109 Retinopathy of prematurity, unspecified, unspecified eye: Secondary | ICD-10-CM | POA: Diagnosis not present

## 2018-09-26 DIAGNOSIS — E46 Unspecified protein-calorie malnutrition: Secondary | ICD-10-CM | POA: Diagnosis not present

## 2018-09-26 DIAGNOSIS — I10 Essential (primary) hypertension: Secondary | ICD-10-CM | POA: Diagnosis not present

## 2018-09-26 DIAGNOSIS — Z89511 Acquired absence of right leg below knee: Secondary | ICD-10-CM | POA: Diagnosis not present

## 2018-09-26 DIAGNOSIS — E11319 Type 2 diabetes mellitus with unspecified diabetic retinopathy without macular edema: Secondary | ICD-10-CM | POA: Diagnosis not present

## 2018-09-26 DIAGNOSIS — H3530 Unspecified macular degeneration: Secondary | ICD-10-CM | POA: Diagnosis not present

## 2018-09-26 DIAGNOSIS — E1122 Type 2 diabetes mellitus with diabetic chronic kidney disease: Secondary | ICD-10-CM | POA: Diagnosis not present

## 2018-09-26 DIAGNOSIS — E160 Drug-induced hypoglycemia without coma: Secondary | ICD-10-CM | POA: Diagnosis not present

## 2018-09-26 DIAGNOSIS — E782 Mixed hyperlipidemia: Secondary | ICD-10-CM | POA: Diagnosis not present

## 2018-09-26 DIAGNOSIS — N184 Chronic kidney disease, stage 4 (severe): Secondary | ICD-10-CM | POA: Diagnosis not present

## 2018-11-15 ENCOUNTER — Other Ambulatory Visit: Payer: Self-pay

## 2018-11-15 ENCOUNTER — Encounter (HOSPITAL_COMMUNITY): Payer: Self-pay | Admitting: Emergency Medicine

## 2018-11-15 ENCOUNTER — Emergency Department (HOSPITAL_COMMUNITY): Payer: Medicare Other

## 2018-11-15 ENCOUNTER — Inpatient Hospital Stay (HOSPITAL_COMMUNITY)
Admission: EM | Admit: 2018-11-15 | Discharge: 2018-11-19 | DRG: 392 | Disposition: A | Discharge status: Home Health | Payer: Medicare Other | Attending: Family Medicine | Admitting: Family Medicine

## 2018-11-15 ENCOUNTER — Observation Stay (HOSPITAL_COMMUNITY): Payer: Medicare Other

## 2018-11-15 DIAGNOSIS — E1169 Type 2 diabetes mellitus with other specified complication: Secondary | ICD-10-CM | POA: Diagnosis present

## 2018-11-15 DIAGNOSIS — N179 Acute kidney failure, unspecified: Secondary | ICD-10-CM | POA: Diagnosis not present

## 2018-11-15 DIAGNOSIS — N400 Enlarged prostate without lower urinary tract symptoms: Secondary | ICD-10-CM | POA: Diagnosis not present

## 2018-11-15 DIAGNOSIS — Z20828 Contact with and (suspected) exposure to other viral communicable diseases: Secondary | ICD-10-CM | POA: Diagnosis not present

## 2018-11-15 DIAGNOSIS — Z79899 Other long term (current) drug therapy: Secondary | ICD-10-CM

## 2018-11-15 DIAGNOSIS — K8689 Other specified diseases of pancreas: Secondary | ICD-10-CM | POA: Diagnosis not present

## 2018-11-15 DIAGNOSIS — R42 Dizziness and giddiness: Secondary | ICD-10-CM | POA: Diagnosis not present

## 2018-11-15 DIAGNOSIS — R110 Nausea: Secondary | ICD-10-CM | POA: Diagnosis not present

## 2018-11-15 DIAGNOSIS — K5641 Fecal impaction: Secondary | ICD-10-CM | POA: Diagnosis present

## 2018-11-15 DIAGNOSIS — I491 Atrial premature depolarization: Secondary | ICD-10-CM | POA: Diagnosis not present

## 2018-11-15 DIAGNOSIS — K5289 Other specified noninfective gastroenteritis and colitis: Secondary | ICD-10-CM | POA: Diagnosis not present

## 2018-11-15 DIAGNOSIS — I10 Essential (primary) hypertension: Secondary | ICD-10-CM | POA: Diagnosis not present

## 2018-11-15 DIAGNOSIS — I959 Hypotension, unspecified: Secondary | ICD-10-CM | POA: Diagnosis present

## 2018-11-15 DIAGNOSIS — K529 Noninfective gastroenteritis and colitis, unspecified: Secondary | ICD-10-CM | POA: Diagnosis present

## 2018-11-15 DIAGNOSIS — Z209 Contact with and (suspected) exposure to unspecified communicable disease: Secondary | ICD-10-CM | POA: Diagnosis not present

## 2018-11-15 DIAGNOSIS — Z794 Long term (current) use of insulin: Secondary | ICD-10-CM

## 2018-11-15 DIAGNOSIS — N184 Chronic kidney disease, stage 4 (severe): Secondary | ICD-10-CM | POA: Diagnosis present

## 2018-11-15 DIAGNOSIS — E1122 Type 2 diabetes mellitus with diabetic chronic kidney disease: Secondary | ICD-10-CM | POA: Diagnosis present

## 2018-11-15 DIAGNOSIS — R531 Weakness: Secondary | ICD-10-CM | POA: Diagnosis not present

## 2018-11-15 DIAGNOSIS — R1110 Vomiting, unspecified: Secondary | ICD-10-CM | POA: Diagnosis not present

## 2018-11-15 DIAGNOSIS — E872 Acidosis, unspecified: Secondary | ICD-10-CM

## 2018-11-15 DIAGNOSIS — I129 Hypertensive chronic kidney disease with stage 1 through stage 4 chronic kidney disease, or unspecified chronic kidney disease: Secondary | ICD-10-CM | POA: Diagnosis present

## 2018-11-15 DIAGNOSIS — R112 Nausea with vomiting, unspecified: Secondary | ICD-10-CM

## 2018-11-15 DIAGNOSIS — Z03818 Encounter for observation for suspected exposure to other biological agents ruled out: Secondary | ICD-10-CM | POA: Diagnosis not present

## 2018-11-15 DIAGNOSIS — K8020 Calculus of gallbladder without cholecystitis without obstruction: Secondary | ICD-10-CM | POA: Diagnosis not present

## 2018-11-15 DIAGNOSIS — E860 Dehydration: Secondary | ICD-10-CM | POA: Diagnosis present

## 2018-11-15 DIAGNOSIS — Z8249 Family history of ischemic heart disease and other diseases of the circulatory system: Secondary | ICD-10-CM

## 2018-11-15 DIAGNOSIS — Z7982 Long term (current) use of aspirin: Secondary | ICD-10-CM

## 2018-11-15 DIAGNOSIS — K219 Gastro-esophageal reflux disease without esophagitis: Secondary | ICD-10-CM | POA: Diagnosis present

## 2018-11-15 DIAGNOSIS — N200 Calculus of kidney: Secondary | ICD-10-CM | POA: Diagnosis not present

## 2018-11-15 DIAGNOSIS — R55 Syncope and collapse: Secondary | ICD-10-CM | POA: Diagnosis not present

## 2018-11-15 DIAGNOSIS — Z89611 Acquired absence of right leg above knee: Secondary | ICD-10-CM

## 2018-11-15 LAB — CBC WITH DIFFERENTIAL/PLATELET
Abs Immature Granulocytes: 0.04 10*3/uL (ref 0.00–0.07)
Basophils Absolute: 0 10*3/uL (ref 0.0–0.1)
Basophils Relative: 0 %
Eosinophils Absolute: 0.1 10*3/uL (ref 0.0–0.5)
Eosinophils Relative: 1 %
HCT: 41.9 % (ref 39.0–52.0)
Hemoglobin: 13.4 g/dL (ref 13.0–17.0)
Immature Granulocytes: 0 %
Lymphocytes Relative: 9 %
Lymphs Abs: 0.8 10*3/uL (ref 0.7–4.0)
MCH: 31.8 pg (ref 26.0–34.0)
MCHC: 32 g/dL (ref 30.0–36.0)
MCV: 99.5 fL (ref 80.0–100.0)
Monocytes Absolute: 0.2 10*3/uL (ref 0.1–1.0)
Monocytes Relative: 2 %
Neutro Abs: 8.1 10*3/uL — ABNORMAL HIGH (ref 1.7–7.7)
Neutrophils Relative %: 88 %
Platelets: 188 10*3/uL (ref 150–400)
RBC: 4.21 MIL/uL — ABNORMAL LOW (ref 4.22–5.81)
RDW: 13.2 % (ref 11.5–15.5)
WBC: 9.2 10*3/uL (ref 4.0–10.5)
nRBC: 0 % (ref 0.0–0.2)

## 2018-11-15 LAB — COMPREHENSIVE METABOLIC PANEL
ALT: 23 U/L (ref 0–44)
AST: 26 U/L (ref 15–41)
Albumin: 3.5 g/dL (ref 3.5–5.0)
Alkaline Phosphatase: 76 U/L (ref 38–126)
Anion gap: 11 (ref 5–15)
BUN: 43 mg/dL — ABNORMAL HIGH (ref 8–23)
CO2: 20 mmol/L — ABNORMAL LOW (ref 22–32)
Calcium: 9 mg/dL (ref 8.9–10.3)
Chloride: 108 mmol/L (ref 98–111)
Creatinine, Ser: 2.24 mg/dL — ABNORMAL HIGH (ref 0.61–1.24)
GFR calc Af Amer: 28 mL/min — ABNORMAL LOW (ref >60)
GFR calc non Af Amer: 24 mL/min — ABNORMAL LOW (ref >60)
Glucose, Bld: 229 mg/dL — ABNORMAL HIGH (ref 70–99)
Potassium: 4.5 mmol/L (ref 3.5–5.1)
Sodium: 139 mmol/L (ref 135–145)
Total Bilirubin: 0.8 mg/dL (ref 0.3–1.2)
Total Protein: 6.1 g/dL — ABNORMAL LOW (ref 6.5–8.1)

## 2018-11-15 LAB — I-STAT CHEM 8, ED
BUN: 37 mg/dL — ABNORMAL HIGH (ref 8–23)
Calcium, Ion: 1.18 mmol/L (ref 1.15–1.40)
Chloride: 108 mmol/L (ref 98–111)
Creatinine, Ser: 2.2 mg/dL — ABNORMAL HIGH (ref 0.61–1.24)
Glucose, Bld: 224 mg/dL — ABNORMAL HIGH (ref 70–99)
HCT: 40 % (ref 39.0–52.0)
Hemoglobin: 13.6 g/dL (ref 13.0–17.0)
Potassium: 4.5 mmol/L (ref 3.5–5.1)
Sodium: 138 mmol/L (ref 135–145)
TCO2: 22 mmol/L (ref 22–32)

## 2018-11-15 LAB — TROPONIN I (HIGH SENSITIVITY)
Troponin I (High Sensitivity): 17 ng/L (ref <18)
Troponin I (High Sensitivity): 14 ng/L (ref <18)

## 2018-11-15 LAB — LACTIC ACID, PLASMA
Lactic Acid, Venous: 3.4 mmol/L (ref 0.5–1.9)
Lactic Acid, Venous: 3.1 mmol/L (ref 0.5–1.9)
Lactic Acid, Venous: 2.7 mmol/L (ref 0.5–1.9)

## 2018-11-15 LAB — LIPASE, BLOOD: Lipase: 40 U/L (ref 11–51)

## 2018-11-15 MED ORDER — SODIUM CHLORIDE 0.9 % IV SOLN
Freq: Once | INTRAVENOUS | Status: AC
  Administered 2018-11-15: 22:00:00 via INTRAVENOUS

## 2018-11-15 MED ORDER — SODIUM CHLORIDE 0.9 % IV BOLUS
500.0000 mL | Freq: Once | INTRAVENOUS | Status: AC
  Administered 2018-11-15: 16:00:00 500 mL via INTRAVENOUS

## 2018-11-15 MED ORDER — SODIUM CHLORIDE 0.9 % IV BOLUS
500.0000 mL | Freq: Once | INTRAVENOUS | Status: AC
  Administered 2018-11-15: 21:00:00 500 mL via INTRAVENOUS

## 2018-11-15 MED ORDER — ONDANSETRON HCL 4 MG/2ML IJ SOLN
4.0000 mg | Freq: Once | INTRAMUSCULAR | Status: AC
  Administered 2018-11-15: 16:00:00 4 mg via INTRAVENOUS
  Filled 2018-11-15: qty 2

## 2018-11-15 MED ORDER — METRONIDAZOLE IN NACL 5-0.79 MG/ML-% IV SOLN
500.0000 mg | Freq: Once | INTRAVENOUS | Status: AC
  Administered 2018-11-15: 21:00:00 500 mg via INTRAVENOUS
  Filled 2018-11-15: qty 100

## 2018-11-15 MED ORDER — VANCOMYCIN HCL 1.25 G IV SOLR: 1250.0000 mg | INTRAVENOUS | Status: DC

## 2018-11-15 MED ORDER — SODIUM CHLORIDE 0.9 % IV BOLUS
500.0000 mL | Freq: Once | INTRAVENOUS | Status: AC
  Administered 2018-11-16: 04:00:00 500 mL via INTRAVENOUS

## 2018-11-15 MED ORDER — VANCOMYCIN HCL 10 G IV SOLR
INTRAVENOUS | Status: AC
  Filled 2018-11-15: qty 1500

## 2018-11-15 MED ORDER — SODIUM CHLORIDE 0.9 % IV SOLN: 2.0000 g | INTRAVENOUS | Status: DC

## 2018-11-15 MED ORDER — VANCOMYCIN HCL 1.5 G IV SOLR
1500.0000 mg | Freq: Once | INTRAVENOUS | Status: AC
  Administered 2018-11-15: 23:00:00 1500 mg via INTRAVENOUS
  Filled 2018-11-15: qty 1500

## 2018-11-15 MED ORDER — IOHEXOL 300 MG/ML  SOLN: 100.0000 mL | Freq: Once | INTRAMUSCULAR | Status: DC | PRN

## 2018-11-15 MED ORDER — SODIUM CHLORIDE 0.9 % IV SOLN
INTRAVENOUS | Status: DC
  Administered 2018-11-15: 22:00:00 via INTRAVENOUS

## 2018-11-15 MED ORDER — VANCOMYCIN HCL IN DEXTROSE 1-5 GM/200ML-% IV SOLN: 1000.0000 mg | Freq: Once | INTRAVENOUS | Status: DC

## 2018-11-15 MED ORDER — SODIUM CHLORIDE 0.9 % IV SOLN
2.0000 g | Freq: Once | INTRAVENOUS | Status: AC
  Administered 2018-11-15: 21:00:00 2 g via INTRAVENOUS
  Filled 2018-11-15: qty 2

## 2018-11-15 NOTE — Progress Notes (Signed)
Attempted to get report.  Nurse unable to give report at this time.  Will wait for return call.

## 2018-11-15 NOTE — ED Notes (Signed)
Date and time results received: 11/15/18 2227 (use smartphrase ".now" to insert current time)  Test: lactic acid Critical Value: 2.7  Name of Provider Notified: Dr Denton Brick  Orders Received? Or Actions Taken?: Actions Taken: no orders received

## 2018-11-15 NOTE — ED Notes (Signed)
Date and time results received: 11/15/18 1711 (use smartphrase ".now" to insert current time)  Test: lactic acid Critical Value: 3.4  Name of Provider Notified: long,md  Orders Received? Or Actions Taken?:

## 2018-11-15 NOTE — ED Notes (Signed)
ED TO INPATIENT HANDOFF REPORT  ED Nurse Name and Phone #: Sabryna Lahm [phone]  S Name/Age/Gender Riley Griffith 85 y.o. male Room/Bed: APA02/APA02  Code Status   Code Status: Not on file  Home/SNF/Other Home Patient oriented to: self, place, time and situation Is this baseline? Yes   Triage Complete: Triage complete  Chief Complaint  hypertension  Triage Note Pt from home with wife.  Pt c/o of "feeling faint and nauseous".  Pt hypotensive at scene and vomited x 1. Weak when standing.   Pt given 4mg  of zofran and 500cc of NS   Allergies No Known Allergies  Level of Care/Admitting Diagnosis ED Disposition    ED Disposition Condition Comment   Admit  Hospital Area: [facility] [[id number]]  Level of Care: Telemetry [5]  Covid Evaluation: Asymptomatic Screening Protocol (No Symptoms)  Diagnosis: Generalized weakness [001749]  Admitting Physician: Bethena Roys [phone]  Attending Physician: Bethena Roys Nessa.Cuff  PT Class (Do Not Modify): Observation [104]  PT Acc Code (Do Not Modify): Observation [10022]       B Medical/Surgery History Past Medical History:  Diagnosis Date  . Diabetes mellitus   . GERD (gastroesophageal reflux disease)   . Hypertension   . Osteomyelitis of foot ([place])   . Renal disorder    see Dr Tawni Carnes   Past Surgical History:  Procedure Laterality Date  . AMPUTATION  01/25/2012   Procedure: AMPUTATION BELOW KNEE;  Surgeon: Rozanna Box, MD;  Location: [place];  Service: Orthopedics;  Laterality: Right;  REVISION OF BELOW KNEE  AMPUTATION   . APPLICATION OF WOUND VAC  01/25/2012   Procedure: APPLICATION OF WOUND VAC;  Surgeon: Rozanna Box, MD;  Location: [place];  Service: Orthopedics;  Laterality: N/A;  AND PLACEMENT OF WOUND VAC   . LEG AMPUTATION BELOW KNEE     rt knee     A IV Location/Drains/Wounds Patient Lines/Drains/Airways Status   Active Line/Drains/Airways    Name:   Placement date:   Placement time:    Site:   Days:   Peripheral IV 11/15/18 Left Antecubital   11/15/18    1543    Antecubital   less than 1   Peripheral IV 11/15/18 Right Antecubital   11/15/18    2110    Antecubital   less than 1   Incision 01/25/12 Leg Right   01/25/12    1202     2486   Incision 01/27/12 Leg Right   01/27/12    0853     2484   Wound 03/24/11 Skin tear Face Right   03/24/11    1017    Face   2793   Wound 03/24/11 Laceration Finger (Comment which one) Right   03/24/11    1017    Finger (Comment which one)   2793          Intake/Output Last 24 hours No intake or output data in the 24 hours ending 11/15/18 2237  Labs/Imaging Results for orders placed or performed during the hospital encounter of 11/15/18 (from the past 48 hour(s))  Comprehensive metabolic panel     Status: Abnormal   Collection Time: 11/15/18  4:21 PM  Result Value Ref Range   Sodium 139 135 - 145 mmol/L   Potassium 4.5 3.5 - 5.1 mmol/L   Chloride 108 98 - 111 mmol/L   CO2 20 (L) 22 - 32 mmol/L   Glucose, Bld 229 (H) 70 - 99 mg/dL   BUN 43 (  H) 8 - 23 mg/dL   Creatinine, Ser 2.24 (H) 0.61 - 1.24 mg/dL   Calcium 9.0 8.9 - 10.3 mg/dL   Total Protein 6.1 (L) 6.5 - 8.1 g/dL   Albumin 3.5 3.5 - 5.0 g/dL   AST 26 15 - 41 U/L   ALT 23 0 - 44 U/L   Alkaline Phosphatase 76 38 - 126 U/L   Total Bilirubin 0.8 0.3 - 1.2 mg/dL   GFR calc non Af Amer 24 (L) >60 mL/min   GFR calc Af Amer 28 (L) >60 mL/min   Anion gap 11 5 - 15    Comment: Performed at [facility], [address], [place], [place] [address]  Lipase, blood     Status: None   Collection Time: 11/15/18  4:21 PM  Result Value Ref Range   Lipase 40 11 - 51 U/L    Comment: Performed at [facility], [address] [place], [place] [address]  Troponin I (High Sensitivity)     Status: None   Collection Time: 11/15/18  4:21 PM  Result Value Ref Range   Troponin I (High Sensitivity) 17 <18 ng/L    Comment: (NOTE) Elevated high sensitivity troponin I (hsTnI) values and significant   changes across serial measurements may suggest ACS but many other  chronic and acute conditions are known to elevate hsTnI results.  Refer to the "Links" section for chest pain algorithms and additional  guidance. Performed at [facility], [address] [place], [place] [address]   CBC with Differential     Status: Abnormal   Collection Time: 11/15/18  4:21 PM  Result Value Ref Range   WBC 9.2 4.0 - 10.5 K/uL   RBC 4.21 (L) 4.22 - 5.81 MIL/uL   Hemoglobin 13.4 13.0 - 17.0 g/dL   HCT 41.9 39.0 - 52.0 %   MCV 99.5 80.0 - 100.0 fL   MCH 31.8 26.0 - 34.0 pg   MCHC 32.0 30.0 - 36.0 g/dL   RDW 13.2 11.5 - 15.5 %   Platelets 188 150 - 400 K/uL   nRBC 0.0 0.0 - 0.2 %   Neutrophils Relative % 88 %   Neutro Abs 8.1 (H) 1.7 - 7.7 K/uL   Lymphocytes Relative 9 %   Lymphs Abs 0.8 0.7 - 4.0 K/uL   Monocytes Relative 2 %   Monocytes Absolute 0.2 0.1 - 1.0 K/uL   Eosinophils Relative 1 %   Eosinophils Absolute 0.1 0.0 - 0.5 K/uL   Basophils Relative 0 %   Basophils Absolute 0.0 0.0 - 0.1 K/uL   Immature Granulocytes 0 %   Abs Immature Granulocytes 0.04 0.00 - 0.07 K/uL    Comment: Performed at [facility], [address], [place], [place] [address]  Lactic acid, plasma     Status: Abnormal   Collection Time: 11/15/18  4:21 PM  Result Value Ref Range   Lactic Acid, Venous 3.4 (HH) 0.5 - 1.9 mmol/L    Comment: CRITICAL RESULT CALLED TO, READ BACK BY AND VERIFIED WITH: CARDWELL,L ON 11/15/18 AT 1700 BY LOY,C Performed at [facility], [address] [place], [place] [address]   I-stat chem 8, ED (not at [facility] or [facility])     Status: Abnormal   Collection Time: 11/15/18  4:42 PM  Result Value Ref Range   Sodium 138 135 - 145 mmol/L   Potassium 4.5 3.5 - 5.1 mmol/L   Chloride 108 98 - 111 mmol/L   BUN 37 (H) 8 - 23  mg/dL   Creatinine, Ser 2.20 (H) 0.61 - 1.24 mg/dL   Glucose, Bld 224 (H) 70 - 99 mg/dL   Calcium, Ion 1.18 1.15 - 1.40 mmol/L   TCO2 22 22 - 32 mmol/L   Hemoglobin 13.6 13.0 -  17.0 g/dL   HCT 40.0 39.0 - 52.0 %  Lactic acid, plasma     Status: Abnormal   Collection Time: 11/15/18  6:05 PM  Result Value Ref Range   Lactic Acid, Venous 3.1 (HH) 0.5 - 1.9 mmol/L    Comment: CRITICAL RESULT CALLED TO, READ BACK BY AND VERIFIED WITH: Knox Cervi.L ON 11/15/18 AT 1930 BY LOY,C Performed at [facility], [address] [place], [place] [address]   Troponin I (High Sensitivity)     Status: None   Collection Time: 11/15/18  6:05 PM  Result Value Ref Range   Troponin I (High Sensitivity) 14 <18 ng/L    Comment: (NOTE) Elevated high sensitivity troponin I (hsTnI) values and significant  changes across serial measurements may suggest ACS but many other  chronic and acute conditions are known to elevate hsTnI results.  Refer to the "Links" section for chest pain algorithms and additional  guidance. Performed at [facility], [address], [place],  [address]   Lactic acid, plasma     Status: Abnormal   Collection Time: 11/15/18  8:05 PM  Result Value Ref Range   Lactic Acid, Venous 2.7 (HH) 0.5 - 1.9 mmol/L    Comment: CRITICAL RESULT CALLED TO, READ BACK BY AND VERIFIED WITH: BELTON,C ON 11/15/18 AT 2225 BY LOY,C Performed at [facility], [address] [place],  [address]    Ct Abdomen Pelvis Wo Contrast  Result Date: 11/15/2018 CLINICAL DATA:  Weakness.  Nausea and vomiting. EXAM: CT ABDOMEN AND PELVIS WITHOUT CONTRAST TECHNIQUE: Multidetector CT imaging of the abdomen and pelvis was performed following the standard protocol without IV contrast. COMPARISON:  CT abdomen pelvis dated March 24, 2011. FINDINGS: Lower chest: No acute abnormality. Postinflammatory scarring and bronchiectasis in both lower lobes. Hepatobiliary: No focal liver abnormality. Small gallstones. No gallbladder wall thickening or biliary dilatation. Pancreas: 2.2 cm round hypodense lesion arising from the pancreatic body. No ductal dilatation or surrounding inflammatory changes.  Spleen: Normal in size without focal abnormality. Adrenals/Urinary Tract: Adrenal glands are unremarkable. Moderate bilateral renal atrophy. Punctate right renal calculi. No hydronephrosis. Mild slightly asymmetric bladder wall thickening, likely due to chronic outlet obstruction. Stomach/Bowel: Small hiatal hernia. The stomach is otherwise within normal limits. Prominent stool in the rectosigmoid colon with large stool ball in the rectum. Mild inflammatory changes surrounding the rectosigmoid colon. More liquid stool in the remaining colon. Left-sided colonic diverticulosis. The small bowel is unremarkable. Normal appendix. Vascular/Lymphatic: Aortic atherosclerosis. No enlarged abdominal or pelvic lymph nodes. Reproductive: Unchanged marked prostatomegaly. Other: Small fat containing right greater than left inguinal hernias, slightly increased when compared to prior study. Unchanged small fat containing paraumbilical hernia. Trace ascites in the left abdomen. No pneumoperitoneum. Musculoskeletal: No acute or significant osseous findings. Old right-sided rib fractures. Severe thoracolumbar spondylosis. IMPRESSION: 1. Fecal impaction with rectosigmoid stercoral colitis. 2. Unchanged cholelithiasis. 3. Indeterminate 2.2 cm hypodense lesion in the pancreatic body. Given the patient's age, no further follow-up is required. This recommendation follows ACR consensus guidelines: Management of Incidental Pancreatic Cysts: A White Paper of the ACR Incidental Findings Committee. Klemme [id number]. 4. Punctate nonobstructive right nephrolithiasis. 5. Unchanged marked prostatomegaly with chronic bladder outlet obstruction. 6.  Aortic atherosclerosis (ICD10-I70.0).  Electronically Signed   By: Titus Dubin M.D.   On: 11/15/2018 18:43   Dg Chest Port 1 View  Result Date: 11/15/2018 CLINICAL DATA:  85 year old male with lactic acidosis and vomiting. EXAM: PORTABLE CHEST 1 VIEW COMPARISON:  Chest radiograph  dated 01/24/2012 FINDINGS: Shallow inspiration. No focal consolidation, pleural effusion, or pneumothorax. Background of emphysema. The cardiac silhouette is within normal limits. Atherosclerotic calcification of the aortic arch. No acute osseous pathology. IMPRESSION: No active disease. Electronically Signed   By: Anner Crete M.D.   On: 11/15/2018 22:30    Pending Labs Unresulted Labs (From admission, onward)    Start     Ordered   11/17/18 0500  Creatinine, serum  Every Mon-Wed-Fri (0500),   R     11/15/18 1949   11/15/18 1932  Blood Culture (routine x 2)  BLOOD CULTURE X 2,   STAT     11/15/18 1931   11/15/18 1605  Urinalysis, Routine w reflex microscopic  ONCE - STAT,   STAT     11/15/18 1604   Signed and Held  Basic metabolic panel  Tomorrow morning,   R     Signed and Held   Signed and Held  CBC  Tomorrow morning,   R     Signed and Held          Vitals/Pain Today's Vitals   11/15/18 2000 11/15/18 2030 11/15/18 2100 11/15/18 2130  BP: 100/66 112/68 97/64 119/72  Pulse: 95 96 96 96  Resp: 18 20 16  (!) 21  Temp:      TempSrc:      SpO2: 95% 95% 97% 94%  Weight:      Height:      PainSc:        Isolation Precautions No active isolations  Medications Medications  iohexol (OMNIPAQUE) 300 MG/ML solution 100 mL (has no administration in time range)  ceFEPIme (MAXIPIME) 2 g in sodium chloride 0.9 % 100 mL IVPB (has no administration in time range)  vancomycin (VANCOCIN) 1,500 mg in sodium chloride 0.9 % 500 mL IVPB (1,500 mg Intravenous New Bag/Given 11/15/18 2236)  vancomycin (VANCOCIN) 1,250 mg in sodium chloride 0.9 % 250 mL IVPB (has no administration in time range)  sodium chloride 0.9 % bolus 500 mL (has no administration in time range)  0.9 %  sodium chloride infusion ( Intravenous New Bag/Given 11/15/18 2229)  ondansetron (ZOFRAN) injection 4 mg (4 mg Intravenous Given 11/15/18 1626)  sodium chloride 0.9 % bolus 500 mL (0 mLs Intravenous Stopped 11/15/18 1730)   0.9 %  sodium chloride infusion ( Intravenous New Bag/Given 11/15/18 2130)  ceFEPIme (MAXIPIME) 2 g in sodium chloride 0.9 % 100 mL IVPB (0 g Intravenous Stopped 11/15/18 2204)  metroNIDAZOLE (FLAGYL) IVPB 500 mg (0 mg Intravenous Stopped 11/15/18 2230)  sodium chloride 0.9 % bolus 500 mL (0 mLs Intravenous Stopped 11/15/18 2220)  sodium chloride 0.9 % with vancomycin (VANCOCIN) ADS Med (has no administration in time range)    Mobility walks Low fall risk   Focused Assessments    R Recommendations: See Admitting Provider Note  Report given to:   Additional Notes:

## 2018-11-15 NOTE — ED Provider Notes (Signed)
Emergency Department Provider Note   I have reviewed the triage vital signs and the nursing notes.   HISTORY  Chief Complaint Hypotension   HPI Riley Griffith is a 85 y.o. male with PMH of DM, GERD, and HTN presents to the emergency department for evaluation of acute onset lightheadedness, nausea, vomiting.  Patient states that he was out with his wife who went into the grocery store.  He stayed behind in the car with the air conditioning and began to feel lightheaded. He denies having any chest pain, heart palpitations, shortness of breath. He states he been feeling well up until that point. He ate and drank earlier without symptoms. No fevers or chills. No sick contacts. No severe HA. Denies abdominal pain.  Patient states when his wife finished shopping they returned home and he continued to feel "out of it." EMS was called who found him to be hypotensive and vomiting x 1. He was given 500 ml IVF and Zofran in route.   Past Medical History:  Diagnosis Date  . Diabetes mellitus   . GERD (gastroesophageal reflux disease)   . Hypertension   . Osteomyelitis of foot ([place])   . Renal disorder    see Dr Tawni Carnes    Patient Active Problem List   Diagnosis Date Noted  . Generalized weakness 11/15/2018  . Osteomyelitis of tibia ([place]) 01/11/2012  . Diabetes mellitus ([place]) 01/11/2012  . Candida infection 01/11/2012  . Septic bursitis 01/11/2012    Past Surgical History:  Procedure Laterality Date  . AMPUTATION  01/25/2012   Procedure: AMPUTATION BELOW KNEE;  Surgeon: Rozanna Box, MD;  Location: [facility];  Service: Orthopedics;  Laterality: Right;  REVISION OF BELOW KNEE  AMPUTATION   . APPLICATION OF WOUND VAC  01/25/2012   Procedure: APPLICATION OF WOUND VAC;  Surgeon: Rozanna Box, MD;  Location: [facility];  Service: Orthopedics;  Laterality: N/A;  AND PLACEMENT OF WOUND VAC   . LEG AMPUTATION BELOW KNEE     rt knee    Allergies Patient has no known allergies.  History  reviewed. No pertinent family history.  Social History Social History   Tobacco Use  . Smoking status: Never Smoker  . Smokeless tobacco: Never Used  Substance Use Topics  . Alcohol use: No  . Drug use: No    Review of Systems  Constitutional: No fever/chills. Positive lightheadedness.  Eyes: No visual changes. ENT: No sore throat. Cardiovascular: Denies chest pain. Respiratory: Denies shortness of breath. Gastrointestinal: No abdominal pain. Positive nausea and vomiting.  No diarrhea.  No constipation. Genitourinary: Negative for dysuria. Musculoskeletal: Negative for back pain. Skin: Negative for rash. Neurological: Negative for headaches, focal weakness or numbness.  10-point ROS otherwise negative.  ____________________________________________   PHYSICAL EXAM:  VITAL SIGNS: ED Triage Vitals  Enc Vitals Group     BP 11/15/18 1540 114/60     Pulse Rate 11/15/18 1540 78     Resp 11/15/18 1540 19     Temp 11/15/18 1540 (!) 97.5 F (36.4 C)     Temp Source 11/15/18 1540 Oral     SpO2 11/15/18 1540 97 %     Weight 11/15/18 1541 170 lb (77.1 kg)     Height 11/15/18 1541 5\' 11"  (1.803 m)   Constitutional: Alert and oriented. Well appearing and in no acute distress. Eyes: Conjunctivae are normal. Head: Atraumatic. Nose: No congestion/rhinnorhea. Mouth/Throat: Mucous membranes are moist.  Neck: No stridor.   Cardiovascular: Normal rate, regular rhythm.  Good peripheral circulation. Grossly normal heart sounds.   Respiratory: Normal respiratory effort.  No retractions. Lungs CTAB. Gastrointestinal: Soft with mild diffuse tenderness. No rebound or guarding. Mild distention.  Musculoskeletal: No lower extremity tenderness nor edema. No gross deformities of extremities. Neurologic:  Normal speech and language. No gross focal neurologic deficits are appreciated.  Skin:  Skin is warm, dry and intact. No rash noted.   ____________________________________________   LABS  (all labs ordered are listed, but only abnormal results are displayed)  Labs Reviewed  COMPREHENSIVE METABOLIC PANEL - Abnormal; Notable for the following components:      Result Value   CO2 20 (*)    Glucose, Bld 229 (*)    BUN 43 (*)    Creatinine, Ser 2.24 (*)    Total Protein 6.1 (*)    GFR calc non Af Amer 24 (*)    GFR calc Af Amer 28 (*)    All other components within normal limits  CBC WITH DIFFERENTIAL/PLATELET - Abnormal; Notable for the following components:   RBC 4.21 (*)    Neutro Abs 8.1 (*)    All other components within normal limits  LACTIC ACID, PLASMA - Abnormal; Notable for the following components:   Lactic Acid, Venous 3.4 (*)    All other components within normal limits  LACTIC ACID, PLASMA - Abnormal; Notable for the following components:   Lactic Acid, Venous 3.1 (*)    All other components within normal limits  I-STAT CHEM 8, ED - Abnormal; Notable for the following components:   BUN 37 (*)    Creatinine, Ser 2.20 (*)    Glucose, Bld 224 (*)    All other components within normal limits  CULTURE, BLOOD (ROUTINE X 2)  CULTURE, BLOOD (ROUTINE X 2)  LIPASE, BLOOD  URINALYSIS, ROUTINE W REFLEX MICROSCOPIC  TROPONIN I (HIGH SENSITIVITY)  TROPONIN I (HIGH SENSITIVITY)   ____________________________________________  EKG   EKG Interpretation  Date/Time:  Wednesday November 15 2018 15:42:41 EDT Ventricular Rate:  74 PR Interval:  238 QRS Duration: 102 QT Interval:  390 QTC Calculation: 432 R Axis:   -50 Text Interpretation:  Sinus rhythm with 1st degree A-V block with occasional Premature ventricular complexes Left axis deviation Low voltage QRS Cannot rule out Anteroseptal infarct , age undetermined Abnormal ECG No STEMI. Similar to prior.  Confirmed by Nanda Quinton [phone]) on 11/15/2018 3:54:11 PM       ____________________________________________  RADIOLOGY  Ct Abdomen Pelvis Wo Contrast  Result Date: 11/15/2018 CLINICAL DATA:  Weakness.   Nausea and vomiting. EXAM: CT ABDOMEN AND PELVIS WITHOUT CONTRAST TECHNIQUE: Multidetector CT imaging of the abdomen and pelvis was performed following the standard protocol without IV contrast. COMPARISON:  CT abdomen pelvis dated March 24, 2011. FINDINGS: Lower chest: No acute abnormality. Postinflammatory scarring and bronchiectasis in both lower lobes. Hepatobiliary: No focal liver abnormality. Small gallstones. No gallbladder wall thickening or biliary dilatation. Pancreas: 2.2 cm round hypodense lesion arising from the pancreatic body. No ductal dilatation or surrounding inflammatory changes. Spleen: Normal in size without focal abnormality. Adrenals/Urinary Tract: Adrenal glands are unremarkable. Moderate bilateral renal atrophy. Punctate right renal calculi. No hydronephrosis. Mild slightly asymmetric bladder wall thickening, likely due to chronic outlet obstruction. Stomach/Bowel: Small hiatal hernia. The stomach is otherwise within normal limits. Prominent stool in the rectosigmoid colon with large stool ball in the rectum. Mild inflammatory changes surrounding the rectosigmoid colon. More liquid stool in the remaining colon. Left-sided colonic diverticulosis. The small bowel is unremarkable.  Normal appendix. Vascular/Lymphatic: Aortic atherosclerosis. No enlarged abdominal or pelvic lymph nodes. Reproductive: Unchanged marked prostatomegaly. Other: Small fat containing right greater than left inguinal hernias, slightly increased when compared to prior study. Unchanged small fat containing paraumbilical hernia. Trace ascites in the left abdomen. No pneumoperitoneum. Musculoskeletal: No acute or significant osseous findings. Old right-sided rib fractures. Severe thoracolumbar spondylosis. IMPRESSION: 1. Fecal impaction with rectosigmoid stercoral colitis. 2. Unchanged cholelithiasis. 3. Indeterminate 2.2 cm hypodense lesion in the pancreatic body. Given the patient's age, no further follow-up is  required. This recommendation follows ACR consensus guidelines: Management of Incidental Pancreatic Cysts: A White Paper of the ACR Incidental Findings Committee. [place] 0962;83:662-947. 4. Punctate nonobstructive right nephrolithiasis. 5. Unchanged marked prostatomegaly with chronic bladder outlet obstruction. 6.  Aortic atherosclerosis (ICD10-I70.0). Electronically Signed   By: Titus Dubin M.D.   On: 11/15/2018 18:43    ____________________________________________   PROCEDURES  Procedure(s) performed:   Procedures  CRITICAL CARE Performed by: Margette Fast Total critical care time: 35 minutes Critical care time was exclusive of separately billable procedures and treating other patients. Critical care was necessary to treat or prevent imminent or life-threatening deterioration. Critical care was time spent personally by me on the following activities: development of treatment plan with patient and/or surrogate as well as nursing, discussions with consultants, evaluation of patient's response to treatment, examination of patient, obtaining history from patient or surrogate, ordering and performing treatments and interventions, ordering and review of laboratory studies, ordering and review of radiographic studies, pulse oximetry and re-evaluation of patient's condition.  Nanda Quinton, MD Emergency Medicine  ____________________________________________   INITIAL IMPRESSION / ASSESSMENT AND PLAN / ED COURSE  Pertinent labs & imaging results that were available during my care of the patient were reviewed by me and considered in my medical decision making (see chart for details).   Patient presents to the emergency department for evaluation of lightheadedness with nausea.  No chest pain or abdominal discomfort.  Found to be hypotensive on scene with EMS which improved with IV fluids.  Patient with mild abdominal distention here with additional vomiting in the emergency  department.  His EKG does not show any evidence of acute ischemia and appears similar to prior tracings.  He is afebrile.  He is not experiencing chest pain considered atypical ACS and will send troponin but low suspicion for this clinically.  No SIRS criteria or history to strongly suspect sepsis.   The patient is noted to have a lactate>2. With the current information available to me, I don't think the patient is in septic shock. The lactate>2, is related to OTHER SHOCK acute dehydration and vomiting .  CT reviewed. No acute findings. Patient with daily BMs so doubt acute fecal impaction contributing to symptoms. Patient denies rectal pain. Repeat lactate pending. CKD at baseline but had to opt for non-contrast CT in this setting.   Lactic only slightly decreased. 1L of IVF given. Again, doubt sepsis but given persistent elevation with cover with abx for unknown source and admit for obs. Additional IVF given. No hypotension here or additional vomiting.   Discussed patient's case with Hospitalist to request admission. Patient and family (if present) updated with plan. Care transferred to Hospitalist service.  I reviewed all nursing notes, vitals, pertinent old records, EKGs, labs, imaging (as available).  ____________________________________________  FINAL CLINICAL IMPRESSION(S) / ED DIAGNOSES  Final diagnoses:  Near syncope  Non-intractable vomiting with nausea, unspecified vomiting type  Lactic acidosis  MEDICATIONS GIVEN DURING THIS VISIT:  Medications  iohexol (OMNIPAQUE) 300 MG/ML solution 100 mL (has no administration in time range)  0.9 %  sodium chloride infusion (has no administration in time range)  ceFEPIme (MAXIPIME) 2 g in sodium chloride 0.9 % 100 mL IVPB (has no administration in time range)  metroNIDAZOLE (FLAGYL) IVPB 500 mg (has no administration in time range)  sodium chloride 0.9 % bolus 500 mL (has no administration in time range)  ceFEPIme (MAXIPIME) 2 g in  sodium chloride 0.9 % 100 mL IVPB (has no administration in time range)  vancomycin (VANCOCIN) 1,500 mg in sodium chloride 0.9 % 500 mL IVPB (has no administration in time range)  vancomycin (VANCOCIN) 1,250 mg in sodium chloride 0.9 % 250 mL IVPB (has no administration in time range)  ondansetron (ZOFRAN) injection 4 mg (4 mg Intravenous Given 11/15/18 1626)  sodium chloride 0.9 % bolus 500 mL (0 mLs Intravenous Stopped 11/15/18 1730)    Note:  This document was prepared using Dragon voice recognition software and may include unintentional dictation errors.  Nanda Quinton, MD Emergency Medicine    Sherby Moncayo, [facility], MD 11/15/18 2024

## 2018-11-15 NOTE — H&P (Addendum)
History and Physical    Riley Griffith FAO:[id number] DOB: [birth year] DOA: 11/15/2018  PCP: [name], MD   Patient coming from: Home  I have personally briefly reviewed patient's old medical records in [place]  Chief Complaint: Dizziness, Vomiting  HPI: Riley Griffith is a 85 y.o. male with medical history significant for hypertension, diabetes mellitus, osteomyelitis and septic bursitis, who presented to the ED with complaints of dizziness, and vomiting.  [place] shopping with his wife today, he remained in the car with the [facility] on why his wife went into the store.  Patient began to feel lightheaded, he vomited twice today and has had several dry heaving episodes today.  On EMS arrival, patient was hypotensive -exact numbers not documented.  500 mill bolus was given in route. Patient reports daily bowel movements, last bowel movement was yesterday, he denies hard or watery stools.  He denies abdominal pain.  But states his abdomen feels firm.  ED Course: Temperature 97.5, blood pressure on arrival 114/60, lowest recorded 105/66.  Lactic acid 3.4  >> 3.1.  Creatinine about baseline at 2.24, WBC 9.2.  Stable hemoglobin 13.4.  High-sensitivity troponin 17>> 14.  Lipase 40.  UA pending.  CT abdomen and pelvis shows fecal impaction with rectosigmoid stercoral colitis.  Patient was given a total of 1 L bolus in the ED.  Started on broad-spectrum antibiotics for possible sepsis with IV vancomycin cefepime and metronidazole.  Hospitalist to admit.  Review of Systems: As per HPI all other systems reviewed and negative.  Past Medical History:  Diagnosis Date   Diabetes mellitus    GERD (gastroesophageal reflux disease)    Hypertension    Osteomyelitis of foot ([place])    Renal disorder    see Dr Tawni Carnes    Past Surgical History:  Procedure Laterality Date   AMPUTATION  01/25/2012   Procedure: AMPUTATION BELOW KNEE;  Surgeon: Rozanna Box, MD;  Location: [facility];  Service:  Orthopedics;  Laterality: Right;  REVISION OF BELOW KNEE  AMPUTATION    APPLICATION OF WOUND VAC  01/25/2012   Procedure: APPLICATION OF WOUND VAC;  Surgeon: Rozanna Box, MD;  Location: [facility];  Service: Orthopedics;  Laterality: N/A;  AND PLACEMENT OF WOUND VAC    LEG AMPUTATION BELOW KNEE     rt knee     reports that he has never smoked. He has never used smokeless tobacco. He reports that he does not drink alcohol or use drugs.  No Known Allergies  Family history of hypertension.  Prior to Admission medications   Medication Sig Start Date End Date Taking? Authorizing Provider  acetaminophen (TYLENOL) 500 MG tablet Take 500 mg by mouth every 6 (six) hours as needed for mild pain or moderate pain.    Yes [provider]  amLODipine (NORVASC) 10 MG tablet Take 10-20 mg by mouth daily.    Yes [provider]  aspirin 81 MG chewable tablet Chew 81 mg by mouth daily.   Yes [provider]  bimatoprost (LUMIGAN) 0.01 % SOLN Place 1 drop into the left eye at bedtime.   Yes [provider]  brimonidine (ALPHAGAN P) 0.1 % SOLN Place 1 drop into the left eye 2 (two) times daily.   Yes [provider]  calcitRIOL (ROCALTROL) 0.25 MCG capsule Take 0.25 mcg by mouth daily.   Yes [provider]  carvedilol (COREG) 6.25 MG tablet Take 6.25 mg by mouth daily.  11/13/18  Yes  [provider]  furosemide (LASIX) 20 MG tablet Take 20 mg by mouth daily. For swelling    Yes [provider]  insulin glargine (LANTUS) 100 UNIT/ML injection Inject 20-40 Units into the skin daily.    Yes [provider]  lisinopril (PRINIVIL,ZESTRIL) 10 MG tablet Take 10 mg by mouth daily.     Yes [provider]  sodium bicarbonate 650 MG tablet Take 650 mg by mouth 2 (two) times daily.   Yes [provider]  Tamsulosin HCl (FLOMAX) 0.4 MG CAPS Take 0.4 mg by mouth daily after supper.     Yes [provider]    cholecalciferol (VITAMIN D) 1000 UNITS tablet Take 1,000 Units by mouth daily.      [provider]  co-enzyme Q-10 30 MG capsule Take 30 mg by mouth daily.      [provider]  ferrous sulfate 325 (65 FE) MG tablet Take 325 mg by mouth daily with breakfast.    [provider]  fish oil-omega-3 fatty acids 1000 MG capsule Take 2 g by mouth daily.    [provider]  vitamin C (ASCORBIC ACID) 500 MG tablet Take 500 mg by mouth daily.      [provider]    Physical Exam: Vitals:   11/15/18 1730 11/15/18 1800 11/15/18 1830 11/15/18 1900  BP: 126/69 105/66 (!) 123/101 108/66  Pulse: 92 96 92 95  Resp:      Temp:      TempSrc:      SpO2: 95% 96% 96% 95%  Weight:      Height:        Constitutional: NAD, calm, comfortable Vitals:   11/15/18 1730 11/15/18 1800 11/15/18 1830 11/15/18 1900  BP: 126/69 105/66 (!) 123/101 108/66  Pulse: 92 96 92 95  Resp:      Temp:      TempSrc:      SpO2: 95% 96% 96% 95%  Weight:      Height:       Eyes: PERRL, lids and conjunctivae normal ENMT: Mucous membranes are dry. Posterior pharynx clear of any exudate or lesions.  Neck: normal, supple, no masses, no thyromegaly Respiratory: clear to auscultation bilaterally, no wheezing, no crackles. Normal respiratory effort. No accessory muscle use.  Cardiovascular: Regular rate and rhythm, no murmurs / rubs / gallops. No extremity edema. 2+ pedal pulses. No carotid bruits.  Abdomen: Diffuse moderate abdominal tenderness, abdomen soft to slightly firm, no masses palpated. No hepatosplenomegaly. Bowel sounds positive.  Rectal exam chaperoned by nurse, pants and underwear soiled by large amount of dark loose stool, anal sphincter without significant tone, rectal vault empty, as far as I could reach I could not palpate any stool. Musculoskeletal: no clubbing / cyanosis.  Right above-knee amputation normal muscle tone.  Skin: no rashes, lesions, ulcers. No  induration Neurologic: CN 2-12 grossly intact. Strength 5/5 in all 4.  Psychiatric: Normal judgment and insight. Alert and oriented x 3. Normal mood.   Labs on Admission: I have personally reviewed following labs and imaging studies  CBC: Recent Labs  Lab 11/15/18 1621 11/15/18 1642  WBC 9.2  --   NEUTROABS 8.1*  --   HGB 13.4 13.6  HCT 41.9 40.0  MCV 99.5  --   PLT 188  --    Basic Metabolic Panel: Recent Labs  Lab 11/15/18 1621 11/15/18 1642  NA 139 138  K 4.5 4.5  CL 108 108  CO2 20*  --  GLUCOSE 229* 224*  BUN 43* 37*  CREATININE 2.24* 2.20*  CALCIUM 9.0  --    Liver Function Tests: Recent Labs  Lab 11/15/18 1621  AST 26  ALT 23  ALKPHOS 76  BILITOT 0.8  PROT 6.1*  ALBUMIN 3.5   Recent Labs  Lab 11/15/18 1621  LIPASE 40    Radiological Exams on Admission: Ct Abdomen Pelvis Wo Contrast  Result Date: 11/15/2018 CLINICAL DATA:  Weakness.  Nausea and vomiting. EXAM: CT ABDOMEN AND PELVIS WITHOUT CONTRAST TECHNIQUE: Multidetector CT imaging of the abdomen and pelvis was performed following the standard protocol without IV contrast. COMPARISON:  CT abdomen pelvis dated March 24, 2011. FINDINGS: Lower chest: No acute abnormality. Postinflammatory scarring and bronchiectasis in both lower lobes. Hepatobiliary: No focal liver abnormality. Small gallstones. No gallbladder wall thickening or biliary dilatation. Pancreas: 2.2 cm round hypodense lesion arising from the pancreatic body. No ductal dilatation or surrounding inflammatory changes. Spleen: Normal in size without focal abnormality. Adrenals/Urinary Tract: Adrenal glands are unremarkable. Moderate bilateral renal atrophy. Punctate right renal calculi. No hydronephrosis. Mild slightly asymmetric bladder wall thickening, likely due to chronic outlet obstruction. Stomach/Bowel: Small hiatal hernia. The stomach is otherwise within normal limits. Prominent stool in the rectosigmoid colon with large stool ball in the  rectum. Mild inflammatory changes surrounding the rectosigmoid colon. More liquid stool in the remaining colon. Left-sided colonic diverticulosis. The small bowel is unremarkable. Normal appendix. Vascular/Lymphatic: Aortic atherosclerosis. No enlarged abdominal or pelvic lymph nodes. Reproductive: Unchanged marked prostatomegaly. Other: Small fat containing right greater than left inguinal hernias, slightly increased when compared to prior study. Unchanged small fat containing paraumbilical hernia. Trace ascites in the left abdomen. No pneumoperitoneum. Musculoskeletal: No acute or significant osseous findings. Old right-sided rib fractures. Severe thoracolumbar spondylosis. IMPRESSION: 1. Fecal impaction with rectosigmoid stercoral colitis. 2. Unchanged cholelithiasis. 3. Indeterminate 2.2 cm hypodense lesion in the pancreatic body. Given the patient's age, no further follow-up is required. This recommendation follows ACR consensus guidelines: Management of Incidental Pancreatic Cysts: A White Paper of the ACR Incidental Findings Committee. [place] 3267;12:458-099. 4. Punctate nonobstructive right nephrolithiasis. 5. Unchanged marked prostatomegaly with chronic bladder outlet obstruction. 6.  Aortic atherosclerosis (ICD10-I70.0). Electronically Signed   By: Titus Dubin M.D.   On: 11/15/2018 18:43    EKG: Independently reviewed.  Sinus rhythm rate 74.  QTc 432.  PVC.  First-degree AV block with prolonged PR interval 238.  Assessment/Plan Active Problems:   Generalized weakness   History of right above knee amputation ([facility])   Colitis  Stercoral colitis- abdominal tenderness, vomiting, significant lactic acidosis-3.4 >> 3.1.  But with hypotension but stable vitals here.  He is afebrile without leukocytosis.  Patient rules out for sepsis.  Abdominal/pelvis CT Wo suggest fecal impaction with rectosigmoid stercoral colitis.  Reports daily soft bowel movements. Rectal examination-empty rectal  vault, no stool palpated. ?  Postobstructive diarrhea. Broad-spectrum antibiotics IV vancomycin cefepime and metronidazole started in the ED. total of 1.5 L bolus given -Follow-up blood cultures obtained in the ED -Will narrow antibiotics to IV ceftriaxone and metronidazole -CBC, BMP -Trend lactic acid -Repeat 591ml bolus, N/s 100cc/hr x 15hr -Pending clinical course may need GI evaluation for fecal impaction. -Addendum-patient's nurse in the ED reports patient subsequently had 4-5 loose stools in the ED.  Patients is aware that he is having loose stools but unable to control it.  On my exams patient's anal sphincter was without significant tone.  Generalized weakness-with dizziness, and significant lactic acidosis.  High-sensitivity troponin x2 unremarkable- 17>> 14. -Obtain portable chest x-ray -Follow-up UA -Follow-up blood cultures  Hypertension-reported hypotension per EMS.  Systolic 308 to 569 here. -Hold home Norvasc, carvedilol, Lasix 20 mg, lisinopril 10 mg, tamsulosin.  Diabetes mellitus-random glucose 229. -Continue home Lantus at reduced dose 10 units nightly - SSI  DVT prophylaxis: Lovenox Code Status: Full Family Communication: None at bedside Disposition Plan: Per rounding team Consults called: None  admission status: Observation, telemetry  Bethena Roys MD [facility]  11/15/2018, 10:04 PM

## 2018-11-15 NOTE — Progress Notes (Signed)
Pharmacy Antibiotic Note  Riley Griffith is a 85 y.o. male admitted on 11/15/2018 with infection- unknown source.  Pharmacy has been consulted for Vancomycin and Cefepime dosing.  Plan: Vancomycin 1500 mg IV x 1 dose. Vancomycin 1250 mg IV every 48 hours.  Goal trough 15-20 mcg/mL. Cefepime 2000 mg IV every 24 hours. Monitor labs, c/s, and vanco levels as indicated.  Height: 5\' 11"  (180.3 cm) Weight: 170 lb (77.1 kg) IBW/kg (Calculated) : 75.3  Temp (24hrs), Avg:97.5 F (36.4 C), Min:97.5 F (36.4 C), Max:97.5 F (36.4 C)  Recent Labs  Lab 11/15/18 1621 11/15/18 1642 11/15/18 1805  WBC 9.2  --   --   CREATININE 2.24* 2.20*  --   LATICACIDVEN 3.4*  --  3.1*    Estimated Creatinine Clearance: 22.3 mL/min (A) (by C-G formula based on SCr of 2.2 mg/dL (H)).    No Known Allergies  Antimicrobials this admission: Vanco 7/22 >>  Cefepime 7/22 >>  Flagyl 7/22 >>  Dose adjustments this admission: Vanco/Cefepime  Microbiology results: 7/22 BCx: pending   Thank you for allowing pharmacy to be a part of this patient's care.  Ramond Craver 11/15/2018 7:50 PM

## 2018-11-15 NOTE — ED Triage Notes (Addendum)
Pt from home with wife.  Pt c/o of "feeling faint and nauseous".  Pt hypotensive at scene and vomited x 1. Weak when standing.   Pt given 4mg  of zofran and 500cc of NS

## 2018-11-15 NOTE — ED Notes (Signed)
Date and time results received: 11/15/18 1928 (use smartphrase ".now" to insert current time)  Test: lactic acid Critical Value: 3.1  Name of Provider Notified: Long,MD  Orders Received? Or Actions Taken?:

## 2018-11-15 NOTE — ED Notes (Signed)
ED Provider at bedside. 

## 2018-11-16 DIAGNOSIS — R55 Syncope and collapse: Secondary | ICD-10-CM | POA: Diagnosis present

## 2018-11-16 DIAGNOSIS — K5641 Fecal impaction: Secondary | ICD-10-CM | POA: Diagnosis present

## 2018-11-16 DIAGNOSIS — Z794 Long term (current) use of insulin: Secondary | ICD-10-CM | POA: Diagnosis not present

## 2018-11-16 DIAGNOSIS — R42 Dizziness and giddiness: Secondary | ICD-10-CM | POA: Diagnosis not present

## 2018-11-16 DIAGNOSIS — E1169 Type 2 diabetes mellitus with other specified complication: Secondary | ICD-10-CM | POA: Diagnosis present

## 2018-11-16 DIAGNOSIS — E872 Acidosis: Secondary | ICD-10-CM | POA: Diagnosis present

## 2018-11-16 DIAGNOSIS — Z8249 Family history of ischemic heart disease and other diseases of the circulatory system: Secondary | ICD-10-CM | POA: Diagnosis not present

## 2018-11-16 DIAGNOSIS — K219 Gastro-esophageal reflux disease without esophagitis: Secondary | ICD-10-CM | POA: Diagnosis present

## 2018-11-16 DIAGNOSIS — K5289 Other specified noninfective gastroenteritis and colitis: Secondary | ICD-10-CM | POA: Diagnosis present

## 2018-11-16 DIAGNOSIS — N184 Chronic kidney disease, stage 4 (severe): Secondary | ICD-10-CM | POA: Diagnosis present

## 2018-11-16 DIAGNOSIS — I129 Hypertensive chronic kidney disease with stage 1 through stage 4 chronic kidney disease, or unspecified chronic kidney disease: Secondary | ICD-10-CM | POA: Diagnosis present

## 2018-11-16 DIAGNOSIS — E860 Dehydration: Secondary | ICD-10-CM | POA: Diagnosis present

## 2018-11-16 DIAGNOSIS — Z20828 Contact with and (suspected) exposure to other viral communicable diseases: Secondary | ICD-10-CM | POA: Diagnosis present

## 2018-11-16 DIAGNOSIS — Z89611 Acquired absence of right leg above knee: Secondary | ICD-10-CM | POA: Diagnosis not present

## 2018-11-16 DIAGNOSIS — K529 Noninfective gastroenteritis and colitis, unspecified: Secondary | ICD-10-CM | POA: Diagnosis not present

## 2018-11-16 DIAGNOSIS — N179 Acute kidney failure, unspecified: Secondary | ICD-10-CM | POA: Diagnosis present

## 2018-11-16 DIAGNOSIS — Z79899 Other long term (current) drug therapy: Secondary | ICD-10-CM | POA: Diagnosis not present

## 2018-11-16 DIAGNOSIS — R531 Weakness: Secondary | ICD-10-CM | POA: Diagnosis not present

## 2018-11-16 DIAGNOSIS — I959 Hypotension, unspecified: Secondary | ICD-10-CM | POA: Diagnosis present

## 2018-11-16 DIAGNOSIS — E1122 Type 2 diabetes mellitus with diabetic chronic kidney disease: Secondary | ICD-10-CM | POA: Diagnosis present

## 2018-11-16 DIAGNOSIS — Z7982 Long term (current) use of aspirin: Secondary | ICD-10-CM | POA: Diagnosis not present

## 2018-11-16 LAB — URINALYSIS, ROUTINE W REFLEX MICROSCOPIC
Bilirubin Urine: NEGATIVE
Glucose, UA: NEGATIVE mg/dL
Hgb urine dipstick: NEGATIVE
Ketones, ur: 5 mg/dL — AB
Leukocytes,Ua: NEGATIVE
Nitrite: NEGATIVE
Protein, ur: NEGATIVE mg/dL
Specific Gravity, Urine: 1.016 (ref 1.005–1.030)
pH: 5 (ref 5.0–8.0)

## 2018-11-16 LAB — BASIC METABOLIC PANEL
Anion gap: 13 (ref 5–15)
BUN: 55 mg/dL — ABNORMAL HIGH (ref 8–23)
CO2: 15 mmol/L — ABNORMAL LOW (ref 22–32)
Calcium: 8.2 mg/dL — ABNORMAL LOW (ref 8.9–10.3)
Chloride: 113 mmol/L — ABNORMAL HIGH (ref 98–111)
Creatinine, Ser: 2.33 mg/dL — ABNORMAL HIGH (ref 0.61–1.24)
GFR calc Af Amer: 27 mL/min — ABNORMAL LOW (ref >60)
GFR calc non Af Amer: 23 mL/min — ABNORMAL LOW (ref >60)
Glucose, Bld: 287 mg/dL — ABNORMAL HIGH (ref 70–99)
Potassium: 5.1 mmol/L (ref 3.5–5.1)
Sodium: 141 mmol/L (ref 135–145)

## 2018-11-16 LAB — GLUCOSE, CAPILLARY
Glucose-Capillary: 192 mg/dL — ABNORMAL HIGH (ref 70–99)
Glucose-Capillary: 193 mg/dL — ABNORMAL HIGH (ref 70–99)

## 2018-11-16 LAB — SARS CORONAVIRUS 2 BY RT PCR (HOSPITAL ORDER, PERFORMED IN ~~LOC~~ HOSPITAL LAB): SARS Coronavirus 2: NEGATIVE

## 2018-11-16 LAB — CBC
HCT: 40.4 % (ref 39.0–52.0)
Hemoglobin: 13 g/dL (ref 13.0–17.0)
MCH: 32.3 pg (ref 26.0–34.0)
MCHC: 32.2 g/dL (ref 30.0–36.0)
MCV: 100.5 fL — ABNORMAL HIGH (ref 80.0–100.0)
Platelets: 175 10*3/uL (ref 150–400)
RBC: 4.02 MIL/uL — ABNORMAL LOW (ref 4.22–5.81)
RDW: 13.2 % (ref 11.5–15.5)
WBC: 12.1 10*3/uL — ABNORMAL HIGH (ref 4.0–10.5)
nRBC: 0 % (ref 0.0–0.2)

## 2018-11-16 LAB — CBG MONITORING, ED
Glucose-Capillary: 256 mg/dL — ABNORMAL HIGH (ref 70–99)
Glucose-Capillary: 191 mg/dL — ABNORMAL HIGH (ref 70–99)

## 2018-11-16 LAB — LACTIC ACID, PLASMA: Lactic Acid, Venous: 3.5 mmol/L (ref 0.5–1.9)

## 2018-11-16 MED ORDER — METRONIDAZOLE IN NACL 5-0.79 MG/ML-% IV SOLN
500.0000 mg | Freq: Three times a day (TID) | INTRAVENOUS | Status: DC
  Administered 2018-11-16 – 2018-11-19 (×10): 500 mg via INTRAVENOUS
  Filled 2018-11-16 (×10): qty 100

## 2018-11-16 MED ORDER — SODIUM CHLORIDE 0.9 % IV SOLN: 1.0000 g | INTRAVENOUS | Status: DC

## 2018-11-16 MED ORDER — POLYETHYLENE GLYCOL 3350 17 G PO PACK
17.0000 g | PACK | Freq: Two times a day (BID) | ORAL | Status: AC
  Administered 2018-11-16 – 2018-11-18 (×5): 17 g via ORAL
  Filled 2018-11-16 (×5): qty 1

## 2018-11-16 MED ORDER — ACETAMINOPHEN 650 MG RE SUPP: 650.0000 mg | Freq: Four times a day (QID) | RECTAL | Status: DC | PRN

## 2018-11-16 MED ORDER — SODIUM BICARBONATE 650 MG PO TABS
650.0000 mg | ORAL_TABLET | Freq: Two times a day (BID) | ORAL | Status: DC
  Administered 2018-11-16 – 2018-11-19 (×6): 650 mg via ORAL
  Filled 2018-11-16 (×9): qty 1

## 2018-11-16 MED ORDER — ENOXAPARIN SODIUM 30 MG/0.3ML ~~LOC~~ SOLN
30.0000 mg | SUBCUTANEOUS | Status: DC
  Administered 2018-11-17 – 2018-11-19 (×3): 30 mg via SUBCUTANEOUS
  Filled 2018-11-16 (×3): qty 0.3

## 2018-11-16 MED ORDER — ASPIRIN 81 MG PO CHEW
81.0000 mg | CHEWABLE_TABLET | Freq: Every day | ORAL | Status: DC
  Administered 2018-11-16 – 2018-11-19 (×4): 81 mg via ORAL
  Filled 2018-11-16 (×4): qty 1

## 2018-11-16 MED ORDER — BRIMONIDINE TARTRATE 0.15 % OP SOLN
1.0000 [drp] | Freq: Two times a day (BID) | OPHTHALMIC | Status: DC
  Administered 2018-11-16 – 2018-11-19 (×6): 1 [drp] via OPHTHALMIC
  Filled 2018-11-16 (×2): qty 5

## 2018-11-16 MED ORDER — LATANOPROST 0.005 % OP SOLN
1.0000 [drp] | Freq: Every day | OPHTHALMIC | Status: DC
  Administered 2018-11-17 – 2018-11-18 (×2): 1 [drp] via OPHTHALMIC

## 2018-11-16 MED ORDER — LACTULOSE 10 GM/15ML PO SOLN
30.0000 g | Freq: Once | ORAL | Status: AC
  Administered 2018-11-16: 19:00:00 30 g via ORAL
  Filled 2018-11-16: qty 60

## 2018-11-16 MED ORDER — ACETAMINOPHEN 325 MG PO TABS: 650.0000 mg | ORAL_TABLET | Freq: Four times a day (QID) | ORAL | Status: DC | PRN

## 2018-11-16 MED ORDER — ONDANSETRON HCL 4 MG/2ML IJ SOLN
4.0000 mg | Freq: Four times a day (QID) | INTRAMUSCULAR | Status: DC | PRN
  Administered 2018-11-16 – 2018-11-18 (×2): 4 mg via INTRAVENOUS
  Filled 2018-11-16 (×2): qty 2

## 2018-11-16 MED ORDER — SODIUM CHLORIDE 0.9 % IV SOLN
2.0000 g | INTRAVENOUS | Status: DC
  Administered 2018-11-16 – 2018-11-18 (×3): 2 g via INTRAVENOUS
  Filled 2018-11-16 (×3): qty 20

## 2018-11-16 MED ORDER — INSULIN GLARGINE 100 UNIT/ML ~~LOC~~ SOLN
10.0000 [IU] | Freq: Every day | SUBCUTANEOUS | Status: DC
  Administered 2018-11-16 – 2018-11-19 (×4): 10 [IU] via SUBCUTANEOUS
  Filled 2018-11-16 (×5): qty 0.1

## 2018-11-16 MED ORDER — TRAZODONE HCL 50 MG PO TABS
25.0000 mg | ORAL_TABLET | Freq: Every evening | ORAL | Status: DC | PRN
  Administered 2018-11-17 – 2018-11-18 (×2): 25 mg via ORAL
  Filled 2018-11-16 (×2): qty 1

## 2018-11-16 MED ORDER — SODIUM CHLORIDE 0.9 % IV SOLN
INTRAVENOUS | Status: DC
  Administered 2018-11-16 – 2018-11-18 (×4): via INTRAVENOUS

## 2018-11-16 MED ORDER — ENOXAPARIN SODIUM 40 MG/0.4ML ~~LOC~~ SOLN
40.0000 mg | SUBCUTANEOUS | Status: DC
  Administered 2018-11-16: 06:00:00 40 mg via SUBCUTANEOUS
  Filled 2018-11-16: qty 0.4

## 2018-11-16 MED ORDER — INSULIN ASPART 100 UNIT/ML ~~LOC~~ SOLN
0.0000 [IU] | Freq: Three times a day (TID) | SUBCUTANEOUS | Status: DC
  Administered 2018-11-16: 09:00:00 5 [IU] via SUBCUTANEOUS
  Administered 2018-11-16 – 2018-11-17 (×3): 2 [IU] via SUBCUTANEOUS
  Administered 2018-11-17: 18:00:00 1 [IU] via SUBCUTANEOUS
  Administered 2018-11-17: 13:00:00 2 [IU] via SUBCUTANEOUS
  Filled 2018-11-16 (×2): qty 1

## 2018-11-16 MED ORDER — ONDANSETRON HCL 4 MG PO TABS: 4.0000 mg | ORAL_TABLET | Freq: Four times a day (QID) | ORAL | Status: DC | PRN

## 2018-11-16 MED ORDER — POLYETHYLENE GLYCOL 3350 17 G PO PACK: 17.0000 g | PACK | Freq: Every day | ORAL | Status: DC | PRN

## 2018-11-16 NOTE — ED Notes (Signed)
Changed out recal pouch bag.

## 2018-11-16 NOTE — Progress Notes (Signed)
Patient Demographics:    Riley Griffith, is a 85 y.o. male, DOB - 09/20/1923, VOJ:[id number]  Admit date - 11/15/2018   Admitting Physician Bethena Roys, MD  Outpatient Primary MD for the patient is [name], MD  LOS - 0   Chief Complaint  Patient presents with   Hypotension        Subjective:    Trigg Delarocha today has no fevers, no emesis,  No chest pain, having loose stools, eager to eat, no further emesis  Assessment  & Plan :    Active Problems:   Generalized weakness   History of right above knee amputation [facility])   Colitis  Brief Summary:- 85 y.o. male with medical history significant for hypertension, diabetes mellitus, osteomyelitis and septic bursitis admitted on 11/15/2018 with intractable emesis, hypotension, and found to have stercoral colitis  A/p 1)Stercoral Colitis--WBC is up to 12.1 from 9.1 on admission, CT abdomen pelvic findings noted, rectal tube with liquid stool, GI pathogen pending, PTA patient did not really have diarrhea is without emesis--continue Rocephin and Flagyl started on 11/15/2018 --Evidence of fecal impaction on abdominal imaging studies, laxatives as ordered  2)CKD IV--- despite transient hypotension on admission patient's renal function appears to be close to his baseline , no recent creatinine available, creatinine was 2.1 on 01/24/2012, admission creatinine 2.2, creatinine currently up to 2.3--  - renally adjust medications, avoid nephrotoxic agents / dehydration and hypotension --- Hold lisinopril and Lasix  3)DM2 -no recent A1c available, continue Lantus insulin 10 units daily, Use Novolog/Humalog Sliding scale insulin with Accu-Cheks/Fingersticks as ordered   4)HTN--blood pressure has been soft, continue to hold amlodipine, Coreg and lisinopril as well as Lasix  Disposition/Need for in-Hospital Stay- patient unable to be discharged at this time  due to stercoral colitis with worsening leukocytosis requiring IV antibiotics and follow-up disimpaction and laxative use  Code Status : Full  Family Communication:   NA (patient is alert, awake and coherent)   Disposition Plan  : To be determined  Consults  :  na  DVT Prophylaxis  :  Lovenox -  SCDs   Lab Results  Component Value Date   PLT 175 11/16/2018    Inpatient Medications  Scheduled Meds:  aspirin  81 mg Oral Daily   brimonidine  1 drop Left Eye BID   [START ON 11/17/2018] enoxaparin (LOVENOX) injection  30 mg Subcutaneous Q24H   insulin aspart  0-9 Units Subcutaneous TID WC   insulin glargine  10 Units Subcutaneous Daily   lactulose  30 g Oral Once   latanoprost  1 drop Left Eye QHS   polyethylene glycol  17 g Oral BID   sodium bicarbonate  650 mg Oral BID   Continuous Infusions:  sodium chloride 50 mL/hr at 11/16/18 1700   cefTRIAXone (ROCEPHIN)  IV 2 g (11/16/18 1705)   metronidazole Stopped (11/16/18 1408)   PRN Meds:.acetaminophen **OR** acetaminophen, ondansetron **OR** ondansetron (ZOFRAN) IV, traZODone    Anti-infectives (From admission, onward)   Start     Dose/Rate Route Frequency Ordered Stop   11/17/18 2000  vancomycin (VANCOCIN) 1,250 mg in sodium chloride 0.9 % 250 mL IVPB  Status:  Discontinued     1,250 mg 166.7 mL/hr over 90 Minutes  Intravenous Every 48 hours 11/15/18 1949 11/16/18 0413   11/16/18 2000  ceFEPIme (MAXIPIME) 2 g in sodium chloride 0.9 % 100 mL IVPB  Status:  Discontinued     2 g 200 mL/hr over 30 Minutes Intravenous Every 24 hours 11/15/18 1946 11/16/18 0413   11/16/18 1900  cefTRIAXone (ROCEPHIN) 1 g in sodium chloride 0.9 % 100 mL IVPB  Status:  Discontinued     1 g 200 mL/hr over 30 Minutes Intravenous Every 24 hours 11/16/18 0413 11/16/18 0735   11/16/18 1800  cefTRIAXone (ROCEPHIN) 2 g in sodium chloride 0.9 % 100 mL IVPB     2 g 200 mL/hr over 30 Minutes Intravenous Every 24 hours 11/16/18 0735      11/16/18 0415  metroNIDAZOLE (FLAGYL) IVPB 500 mg     500 mg 100 mL/hr over 60 Minutes Intravenous Every 8 hours 11/16/18 0413     11/15/18 2030  vancomycin (VANCOCIN) 1,500 mg in sodium chloride 0.9 % 500 mL IVPB     1,500 mg 250 mL/hr over 120 Minutes Intravenous  Once 11/15/18 1947 11/16/18 0219   11/15/18 1945  ceFEPIme (MAXIPIME) 2 g in sodium chloride 0.9 % 100 mL IVPB     2 g 200 mL/hr over 30 Minutes Intravenous  Once 11/15/18 1931 11/15/18 2204   11/15/18 1945  metroNIDAZOLE (FLAGYL) IVPB 500 mg     500 mg 100 mL/hr over 60 Minutes Intravenous  Once 11/15/18 1931 11/15/18 2230   11/15/18 1945  vancomycin (VANCOCIN) IVPB 1000 mg/200 mL premix  Status:  Discontinued     1,000 mg 200 mL/hr over 60 Minutes Intravenous  Once 11/15/18 1931 11/15/18 1947        Objective:   Vitals:   11/16/18 1200 11/16/18 1305 11/16/18 1408 11/16/18 1500  BP: (!) 99/55 102/66 (!) 130/42 (!) 99/58  Pulse: 78 73 78 76  Resp: (!) 22 20 16    Temp:      TempSrc:      SpO2: 97% 95% 94% 93%  Weight:      Height:        Wt Readings from Last 3 Encounters:  11/15/18 77.1 kg  01/25/12 77.1 kg  01/24/12 77.1 kg     Intake/Output Summary (Last 24 hours) at 11/16/2018 1747 Last data filed at 11/16/2018 1719 Gross per 24 hour  Intake 1391.11 ml  Output 350 ml  Net 1041.11 ml     Physical Exam  Gen:- Awake Alert,  In no apparent distress  HEENT:- [place].AT, No sclera icterus Neck-Supple Neck,No JVD,.  Lungs-  CTAB , fair symmetrical air movement CV- S1, S2 normal, regular  Abd-  +ve B.Sounds, Abd Soft, No tenderness,    Extremity/Skin:- No  edema, pedal pulses present  Psych-affect is appropriate, oriented x3 Neuro-no new focal deficits, no tremors MSK--- right BKA Rectal--rectal tube with liquid stool  Data Review:   Micro Results Recent Results (from the past 240 hour(s))  Blood Culture (routine x 2)     Status: None (Preliminary result)   Collection Time: 11/15/18  7:50 PM    Specimen: Blood  Result Value Ref Range Status   Specimen Description BLOOD RIGHT ANTECUBITAL  Final   Special Requests   Final    BOTTLES DRAWN AEROBIC AND ANAEROBIC Blood Culture adequate volume   Culture   Final    NO GROWTH < 12 HOURS Performed at [facility], [address] [place], [place] [address]    Report Status PENDING  Incomplete  Blood Culture (routine x 2)     Status: None (Preliminary result)   Collection Time: 11/15/18  7:55 PM   Specimen: Blood  Result Value Ref Range Status   Specimen Description BLOOD RIGHT ANTECUBITAL  Final   Special Requests   Final    BOTTLES DRAWN AEROBIC AND ANAEROBIC Blood Culture adequate volume   Culture   Final    NO GROWTH < 12 HOURS Performed at [facility], [address], [place], [place] [address]    Report Status PENDING  Incomplete  SARS Coronavirus 2 (CEPHEID - Performed in [place] hospital lab), Hosp Order     Status: None   Collection Time: 11/15/18 11:19 PM   Specimen: Nasopharyngeal Swab  Result Value Ref Range Status   SARS Coronavirus 2 NEGATIVE NEGATIVE Final    Comment: (NOTE) If result is NEGATIVE SARS-CoV-2 target nucleic acids are NOT DETECTED. The SARS-CoV-2 RNA is generally detectable in upper and lower  respiratory specimens during the acute phase of infection. The lowest  concentration of SARS-CoV-2 viral copies this assay can detect is 250  copies / mL. A negative result does not preclude SARS-CoV-2 infection  and should not be used as the sole basis for treatment or other  patient management decisions.  A negative result may occur with  improper specimen collection / handling, submission of specimen other  than nasopharyngeal swab, presence of viral mutation(s) within the  areas targeted by this assay, and inadequate number of viral copies  (<250 copies / mL). A negative result must be combined with clinical  observations, patient history, and epidemiological information. If result is  POSITIVE SARS-CoV-2 target nucleic acids are DETECTED. The SARS-CoV-2 RNA is generally detectable in upper and lower  respiratory specimens dur ing the acute phase of infection.  Positive  results are indicative of active infection with SARS-CoV-2.  Clinical  correlation with patient history and other diagnostic information is  necessary to determine patient infection status.  Positive results do  not rule out bacterial infection or co-infection with other viruses. If result is PRESUMPTIVE POSTIVE SARS-CoV-2 nucleic acids MAY BE PRESENT.   A presumptive positive result was obtained on the submitted specimen  and confirmed on repeat testing.  While 2019 novel coronavirus  (SARS-CoV-2) nucleic acids may be present in the submitted sample  additional confirmatory testing may be necessary for epidemiological  and / or clinical management purposes  to differentiate between  SARS-CoV-2 and other Sarbecovirus currently known to infect humans.  If clinically indicated additional testing with an alternate test  methodology [phone]) is advised. The SARS-CoV-2 RNA is generally  detectable in upper and lower respiratory sp ecimens during the acute  phase of infection. The expected result is Negative. Fact Sheet for Patients:  StrictlyIdeas.no Fact Sheet for Healthcare Providers: BankingDealers.co.za This test is not yet approved or cleared by the [place] FDA and has been authorized for detection and/or diagnosis of SARS-CoV-2 by FDA under an Emergency Use Authorization (EUA).  This EUA will remain in effect (meaning this test can be used) for the duration of the COVID-19 declaration under Section 564(b)(1) of the Act, 21 U.S.C. section 360bbb-3(b)(1), unless the authorization is terminated or revoked sooner. Performed at [facility], [address], [place], [place] [address]     Radiology Reports Ct Abdomen Pelvis Wo Contrast  Result  Date: 11/15/2018 CLINICAL DATA:  Weakness.  Nausea and vomiting. EXAM: CT ABDOMEN AND PELVIS WITHOUT CONTRAST TECHNIQUE: Multidetector CT imaging of the abdomen and pelvis  was performed following the standard protocol without IV contrast. COMPARISON:  CT abdomen pelvis dated March 24, 2011. FINDINGS: Lower chest: No acute abnormality. Postinflammatory scarring and bronchiectasis in both lower lobes. Hepatobiliary: No focal liver abnormality. Small gallstones. No gallbladder wall thickening or biliary dilatation. Pancreas: 2.2 cm round hypodense lesion arising from the pancreatic body. No ductal dilatation or surrounding inflammatory changes. Spleen: Normal in size without focal abnormality. Adrenals/Urinary Tract: Adrenal glands are unremarkable. Moderate bilateral renal atrophy. Punctate right renal calculi. No hydronephrosis. Mild slightly asymmetric bladder wall thickening, likely due to chronic outlet obstruction. Stomach/Bowel: Small hiatal hernia. The stomach is otherwise within normal limits. Prominent stool in the rectosigmoid colon with large stool ball in the rectum. Mild inflammatory changes surrounding the rectosigmoid colon. More liquid stool in the remaining colon. Left-sided colonic diverticulosis. The small bowel is unremarkable. Normal appendix. Vascular/Lymphatic: Aortic atherosclerosis. No enlarged abdominal or pelvic lymph nodes. Reproductive: Unchanged marked prostatomegaly. Other: Small fat containing right greater than left inguinal hernias, slightly increased when compared to prior study. Unchanged small fat containing paraumbilical hernia. Trace ascites in the left abdomen. No pneumoperitoneum. Musculoskeletal: No acute or significant osseous findings. Old right-sided rib fractures. Severe thoracolumbar spondylosis. IMPRESSION: 1. Fecal impaction with rectosigmoid stercoral colitis. 2. Unchanged cholelithiasis. 3. Indeterminate 2.2 cm hypodense lesion in the pancreatic body. Given the  patient's age, no further follow-up is required. This recommendation follows ACR consensus guidelines: Management of Incidental Pancreatic Cysts: A White Paper of the ACR Incidental Findings Committee. [place] 6811;[id number]. 4. Punctate nonobstructive right nephrolithiasis. 5. Unchanged marked prostatomegaly with chronic bladder outlet obstruction. 6.  Aortic atherosclerosis (ICD10-I70.0). Electronically Signed   By: Titus Dubin M.D.   On: 11/15/2018 18:43   Dg Chest Port 1 View  Result Date: 11/15/2018 CLINICAL DATA:  85 year old male with lactic acidosis and vomiting. EXAM: PORTABLE CHEST 1 VIEW COMPARISON:  Chest radiograph dated 01/24/2012 FINDINGS: Shallow inspiration. No focal consolidation, pleural effusion, or pneumothorax. Background of emphysema. The cardiac silhouette is within normal limits. Atherosclerotic calcification of the aortic arch. No acute osseous pathology. IMPRESSION: No active disease. Electronically Signed   By: Anner Crete M.D.   On: 11/15/2018 22:30     CBC Recent Labs  Lab 11/15/18 1621 11/15/18 1642 11/16/18 0446  WBC 9.2  --  12.1*  HGB 13.4 13.6 13.0  HCT 41.9 40.0 40.4  PLT 188  --  175  MCV 99.5  --  100.5*  MCH 31.8  --  32.3  MCHC 32.0  --  32.2  RDW 13.2  --  13.2  LYMPHSABS 0.8  --   --   MONOABS 0.2  --   --   EOSABS 0.1  --   --   BASOSABS 0.0  --   --     Chemistries  Recent Labs  Lab 11/15/18 1621 11/15/18 1642 11/16/18 0446  NA 139 138 141  K 4.5 4.5 5.1  CL 108 108 113*  CO2 20*  --  15*  GLUCOSE 229* 224* 287*  BUN 43* 37* 55*  CREATININE 2.24* 2.20* 2.33*  CALCIUM 9.0  --  8.2*  AST 26  --   --   ALT 23  --   --   ALKPHOS 76  --   --   BILITOT 0.8  --   --    ------------------------------------------------------------------------------------------------------------------ No results for input(s): CHOL, HDL, LDLCALC, TRIG, CHOLHDL, LDLDIRECT in the last 72 hours.  No results found for:  HGBA1C ------------------------------------------------------------------------------------------------------------------ No  results for input(s): TSH, T4TOTAL, T3FREE, THYROIDAB in the last 72 hours.  Invalid input(s): FREET3 ------------------------------------------------------------------------------------------------------------------ No results for input(s): VITAMINB12, FOLATE, FERRITIN, TIBC, IRON, RETICCTPCT in the last 72 hours.  Coagulation profile No results for input(s): INR, PROTIME in the last 168 hours.  No results for input(s): DDIMER in the last 72 hours.  Cardiac Enzymes No results for input(s): CKMB, TROPONINI, MYOGLOBIN in the last 168 hours.  Invalid input(s): CK ------------------------------------------------------------------------------------------------------------------ No results found for: BNP   Roxan Hockey M.D on 11/16/2018 at 5:47 PM  Go to [email or web address] - for contact info  [facility] - Office  [phone]

## 2018-11-16 NOTE — ED Notes (Signed)
CRITICAL VALUE ALERT  Critical Value: Lactic Acid 3.5  Date & Time Notied: 11/16/2018 0510  Provider Notified: Dr. Darrick Meigs, MD  Orders Received/Actions taken: n/a

## 2018-11-16 NOTE — ED Notes (Signed)
Pt given heart healthy breakfast meal tray.

## 2018-11-16 NOTE — ED Notes (Signed)
Pt given lunch meal tray. 

## 2018-11-16 NOTE — Plan of Care (Signed)
  Problem: Education: Goal: Knowledge of General Education information will improve Description: Including pain rating scale, medication(s)/side effects and non-pharmacologic comfort measures Outcome: Progressing   Problem: Clinical Measurements: Goal: Ability to maintain clinical measurements within normal limits will improve Outcome: Progressing Goal: Will remain free from infection Outcome: Progressing Goal: Diagnostic test results will improve Outcome: Progressing Goal: Respiratory complications will improve Outcome: Progressing Goal: Cardiovascular complication will be avoided Outcome: Progressing   Problem: Nutrition: Goal: Adequate nutrition will be maintained Outcome: Progressing   Problem: Coping: Goal: Level of anxiety will decrease Outcome: Progressing   Problem: Elimination: Goal: Will not experience complications related to bowel motility Outcome: Progressing Goal: Will not experience complications related to urinary retention Outcome: Progressing   Problem: Skin Integrity: Goal: Risk for impaired skin integrity will decrease Outcome: Progressing

## 2018-11-17 LAB — GASTROINTESTINAL PANEL BY PCR, STOOL (REPLACES STOOL CULTURE)

## 2018-11-17 LAB — GLUCOSE, CAPILLARY
Glucose-Capillary: 185 mg/dL — ABNORMAL HIGH (ref 70–99)
Glucose-Capillary: 190 mg/dL — ABNORMAL HIGH (ref 70–99)
Glucose-Capillary: 148 mg/dL — ABNORMAL HIGH (ref 70–99)
Glucose-Capillary: 125 mg/dL — ABNORMAL HIGH (ref 70–99)

## 2018-11-17 LAB — BASIC METABOLIC PANEL
Anion gap: 10 (ref 5–15)
BUN: 75 mg/dL — ABNORMAL HIGH (ref 8–23)
CO2: 16 mmol/L — ABNORMAL LOW (ref 22–32)
Calcium: 7.8 mg/dL — ABNORMAL LOW (ref 8.9–10.3)
Chloride: 114 mmol/L — ABNORMAL HIGH (ref 98–111)
Creatinine, Ser: 2.72 mg/dL — ABNORMAL HIGH (ref 0.61–1.24)
GFR calc Af Amer: 22 mL/min — ABNORMAL LOW (ref >60)
GFR calc non Af Amer: 19 mL/min — ABNORMAL LOW (ref >60)
Glucose, Bld: 195 mg/dL — ABNORMAL HIGH (ref 70–99)
Potassium: 4.1 mmol/L (ref 3.5–5.1)
Sodium: 140 mmol/L (ref 135–145)

## 2018-11-17 LAB — CBC
HCT: 36.4 % — ABNORMAL LOW (ref 39.0–52.0)
Hemoglobin: 11.9 g/dL — ABNORMAL LOW (ref 13.0–17.0)
MCH: 32.2 pg (ref 26.0–34.0)
MCHC: 32.7 g/dL (ref 30.0–36.0)
MCV: 98.4 fL (ref 80.0–100.0)
Platelets: 167 10*3/uL (ref 150–400)
RBC: 3.7 MIL/uL — ABNORMAL LOW (ref 4.22–5.81)
RDW: 13.8 % (ref 11.5–15.5)
WBC: 13.5 10*3/uL — ABNORMAL HIGH (ref 4.0–10.5)
nRBC: 0 % (ref 0.0–0.2)

## 2018-11-17 MED ORDER — SENNOSIDES-DOCUSATE SODIUM 8.6-50 MG PO TABS
2.0000 | ORAL_TABLET | Freq: Once | ORAL | Status: AC
  Administered 2018-11-17: 21:00:00 2 via ORAL
  Filled 2018-11-17: qty 2

## 2018-11-17 NOTE — Progress Notes (Signed)
Patient Demographics:    Riley Griffith, is a 85 y.o. male, DOB - 05-08-1923, VFI:[id number]  Admit date - 11/15/2018   Admitting Physician Ejiroghene Arlyce Dice, MD  Outpatient Primary MD for the patient is [name], MD  LOS - 1   Chief Complaint  Patient presents with  . Hypotension        Subjective:    Riley Griffith today has no fevers, no emesis,  No chest pain, oral intake is fair, continues to have significant amount of stool in rectal tube  Assessment  & Plan :    Principal Problem:   Stercoral Colitis Active Problems:   Generalized weakness   History of right above knee amputation [facility])  Brief Summary:- 85 y.o. male with medical history significant for hypertension, diabetes mellitus, osteomyelitis and septic bursitis admitted on 11/15/2018 with intractable emesis, hypotension, and found to have stercoral colitis  A/p 1)Stercoral Colitis--WBC is up to 13.5.1 from 9.1 on admission, afebrile, CT abdomen pelvic findings noted, rectal tube with liquid stool, GI pathogen pending, PTA patient did not really have diarrhea --continue Rocephin and Flagyl started on 11/15/2018 --Evidence of fecal impaction and stercoral colitis on abdominal imaging studies, laxatives as ordered  2)AKI----acute kidney injury on CKD stage - IV due to transient hypotension and dehydration,     creatinine on admission= 2.2,   baseline creatinine = creatinine was 2.1 on 01/24/2012,    , creatinine is now= 2.72 , renally adjust medications, avoid nephrotoxic agents/dehydration/hypotension  --- Hold lisinopril and Lasix -c/n IVF  3)DM2 -no recent A1c available, continue Lantus insulin 10 units daily, Use Novolog/Humalog Sliding scale insulin with Accu-Cheks/Fingersticks as ordered   4)HTN--blood pressure has been soft, continue to hold amlodipine, Coreg and lisinopril as well as Lasix --- Apparently patient may have been  having hypotension at home, no indication for amlodipine given home BPs have been low as well  5) generalized weakness/deconditioning----patient with right BKA prosthesis, get PT eval  Disposition/Need for in-Hospital Stay- patient unable to be discharged at this time due to stercoral colitis with worsening leukocytosis requiring IV antibiotics and follow-up disimpaction and laxative use  Code Status : Full  Family Communication:   (patient is alert, awake and coherent) Discussed with Pt son Mr Frederick Marro--- [phone]  Disposition Plan  : To be determined  Consults  :  na  DVT Prophylaxis  :  Lovenox -  SCDs   Lab Results  Component Value Date   PLT 167 11/17/2018    Inpatient Medications  Scheduled Meds: . aspirin  81 mg Oral Daily  . brimonidine  1 drop Left Eye BID  . enoxaparin (LOVENOX) injection  30 mg Subcutaneous Q24H  . insulin aspart  0-9 Units Subcutaneous TID WC  . insulin glargine  10 Units Subcutaneous Daily  . latanoprost  1 drop Left Eye QHS  . polyethylene glycol  17 g Oral BID  . sodium bicarbonate  650 mg Oral BID   Continuous Infusions: . sodium chloride 75 mL/hr at 11/17/18 1249  . cefTRIAXone (ROCEPHIN)  IV 2 g (11/17/18 1734)  . metronidazole 500 mg (11/17/18 1247)   PRN Meds:.acetaminophen **OR** acetaminophen, ondansetron **OR** ondansetron (ZOFRAN) IV, traZODone    Anti-infectives (From admission, onward)  Start     Dose/Rate Route Frequency Ordered Stop   11/17/18 2000  vancomycin (VANCOCIN) 1,250 mg in sodium chloride 0.9 % 250 mL IVPB  Status:  Discontinued     1,250 mg 166.7 mL/hr over 90 Minutes Intravenous Every 48 hours 11/15/18 1949 11/16/18 0413   11/16/18 2000  ceFEPIme (MAXIPIME) 2 g in sodium chloride 0.9 % 100 mL IVPB  Status:  Discontinued     2 g 200 mL/hr over 30 Minutes Intravenous Every 24 hours 11/15/18 1946 11/16/18 0413   11/16/18 1900  cefTRIAXone (ROCEPHIN) 1 g in sodium chloride 0.9 % 100 mL IVPB  Status:   Discontinued     1 g 200 mL/hr over 30 Minutes Intravenous Every 24 hours 11/16/18 0413 11/16/18 0735   11/16/18 1800  cefTRIAXone (ROCEPHIN) 2 g in sodium chloride 0.9 % 100 mL IVPB     2 g 200 mL/hr over 30 Minutes Intravenous Every 24 hours 11/16/18 0735     11/16/18 0415  metroNIDAZOLE (FLAGYL) IVPB 500 mg     500 mg 100 mL/hr over 60 Minutes Intravenous Every 8 hours 11/16/18 0413     11/15/18 2030  vancomycin (VANCOCIN) 1,500 mg in sodium chloride 0.9 % 500 mL IVPB     1,500 mg 250 mL/hr over 120 Minutes Intravenous  Once 11/15/18 1947 11/16/18 0219   11/15/18 1945  ceFEPIme (MAXIPIME) 2 g in sodium chloride 0.9 % 100 mL IVPB     2 g 200 mL/hr over 30 Minutes Intravenous  Once 11/15/18 1931 11/15/18 2204   11/15/18 1945  metroNIDAZOLE (FLAGYL) IVPB 500 mg     500 mg 100 mL/hr over 60 Minutes Intravenous  Once 11/15/18 1931 11/15/18 2230   11/15/18 1945  vancomycin (VANCOCIN) IVPB 1000 mg/200 mL premix  Status:  Discontinued     1,000 mg 200 mL/hr over 60 Minutes Intravenous  Once 11/15/18 1931 11/15/18 1947        Objective:   Vitals:   11/16/18 2205 11/17/18 0504 11/17/18 0506 11/17/18 1707  BP: (!) 106/43 (!) 104/36 (!) 119/93 118/65  Pulse: 82 81 91 89  Resp: 16 18  20   Temp: 98.4 F (36.9 C) 98.3 F (36.8 C)  98.8 F (37.1 C)  TempSrc: Oral   Oral  SpO2: 96% 96%  97%  Weight:      Height:        Wt Readings from Last 3 Encounters:  11/15/18 77.1 kg  01/25/12 77.1 kg  01/24/12 77.1 kg     Intake/Output Summary (Last 24 hours) at 11/17/2018 1808 Last data filed at 11/17/2018 1000 Gross per 24 hour  Intake 1218.05 ml  Output 600 ml  Net 618.05 ml     Physical Exam  Gen:- Awake Alert,  In no apparent distress  HEENT:- [place].AT, No sclera icterus Neck-Supple Neck,No JVD,.  Lungs-  CTAB , fair symmetrical air movement CV- S1, S2 normal, regular  Abd-  +ve B.Sounds, Abd Soft, mild lower abdominal tenderness without rebound or guarding Extremity/Skin:- No   edema, pedal pulses present  Psych-affect is appropriate, oriented x3 Neuro-no new focal deficits, no tremors MSK--- right BKA Rectal--rectal tube with liquid stool  Data Review:   Micro Results Recent Results (from the past 240 hour(s))  Blood Culture (routine x 2)     Status: None (Preliminary result)   Collection Time: 11/15/18  7:50 PM   Specimen: BLOOD  Result Value Ref Range Status   Specimen Description BLOOD RIGHT ANTECUBITAL  Final  Special Requests   Final    BOTTLES DRAWN AEROBIC AND ANAEROBIC Blood Culture adequate volume   Culture   Final    NO GROWTH 2 DAYS Performed at [facility], [address] [place], [place] [address]    Report Status PENDING  Incomplete  Blood Culture (routine x 2)     Status: None (Preliminary result)   Collection Time: 11/15/18  7:55 PM   Specimen: BLOOD  Result Value Ref Range Status   Specimen Description BLOOD RIGHT ANTECUBITAL  Final   Special Requests   Final    BOTTLES DRAWN AEROBIC AND ANAEROBIC Blood Culture adequate volume   Culture   Final    NO GROWTH 2 DAYS Performed at [facility], [address] [place], [place] [address]    Report Status PENDING  Incomplete  SARS Coronavirus 2 (CEPHEID - Performed in [place] hospital lab), Hosp Order     Status: None   Collection Time: 11/15/18 11:19 PM   Specimen: Nasopharyngeal Swab  Result Value Ref Range Status   SARS Coronavirus 2 NEGATIVE NEGATIVE Final    Comment: (NOTE) If result is NEGATIVE SARS-CoV-2 target nucleic acids are NOT DETECTED. The SARS-CoV-2 RNA is generally detectable in upper and lower  respiratory specimens during the acute phase of infection. The lowest  concentration of SARS-CoV-2 viral copies this assay can detect is 250  copies / mL. A negative result does not preclude SARS-CoV-2 infection  and should not be used as the sole basis for treatment or other  patient management decisions.  A negative result may occur with  improper specimen collection  / handling, submission of specimen other  than nasopharyngeal swab, presence of viral mutation(s) within the  areas targeted by this assay, and inadequate number of viral copies  (<250 copies / mL). A negative result must be combined with clinical  observations, patient history, and epidemiological information. If result is POSITIVE SARS-CoV-2 target nucleic acids are DETECTED. The SARS-CoV-2 RNA is generally detectable in upper and lower  respiratory specimens dur ing the acute phase of infection.  Positive  results are indicative of active infection with SARS-CoV-2.  Clinical  correlation with patient history and other diagnostic information is  necessary to determine patient infection status.  Positive results do  not rule out bacterial infection or co-infection with other viruses. If result is PRESUMPTIVE POSTIVE SARS-CoV-2 nucleic acids MAY BE PRESENT.   A presumptive positive result was obtained on the submitted specimen  and confirmed on repeat testing.  While 2019 novel coronavirus  (SARS-CoV-2) nucleic acids may be present in the submitted sample  additional confirmatory testing may be necessary for epidemiological  and / or clinical management purposes  to differentiate between  SARS-CoV-2 and other Sarbecovirus currently known to infect humans.  If clinically indicated additional testing with an alternate test  methodology [phone]) is advised. The SARS-CoV-2 RNA is generally  detectable in upper and lower respiratory sp ecimens during the acute  phase of infection. The expected result is Negative. Fact Sheet for Patients:  StrictlyIdeas.no Fact Sheet for Healthcare Providers: BankingDealers.co.za This test is not yet approved or cleared by the [place] FDA and has been authorized for detection and/or diagnosis of SARS-CoV-2 by FDA under an Emergency Use Authorization (EUA).  This EUA will remain in effect (meaning this  test can be used) for the duration of the COVID-19 declaration under Section 564(b)(1) of the Act, 21 U.S.C. section 360bbb-3(b)(1), unless the authorization is terminated or revoked sooner. Performed  at [facility], [address] [place], [place] [address]   Gastrointestinal Panel by PCR , Stool     Status: None   Collection Time: 11/16/18  1:02 PM   Specimen: STOOL  Result Value Ref Range Status   Campylobacter species NOT DETECTED NOT DETECTED Final   Plesimonas shigelloides NOT DETECTED NOT DETECTED Final   Salmonella species NOT DETECTED NOT DETECTED Final   Yersinia enterocolitica NOT DETECTED NOT DETECTED Final   Vibrio species NOT DETECTED NOT DETECTED Final   Vibrio cholerae NOT DETECTED NOT DETECTED Final   Enteroaggregative E coli (EAEC) NOT DETECTED NOT DETECTED Final   Enteropathogenic E coli (EPEC) NOT DETECTED NOT DETECTED Final   Enterotoxigenic E coli (ETEC) NOT DETECTED NOT DETECTED Final   Shiga like toxin producing E coli (STEC) NOT DETECTED NOT DETECTED Final   Shigella/Enteroinvasive E coli (EIEC) NOT DETECTED NOT DETECTED Final   Cryptosporidium NOT DETECTED NOT DETECTED Final   Cyclospora cayetanensis NOT DETECTED NOT DETECTED Final   Entamoeba histolytica NOT DETECTED NOT DETECTED Final   Giardia lamblia NOT DETECTED NOT DETECTED Final   Adenovirus F40/41 NOT DETECTED NOT DETECTED Final   Astrovirus NOT DETECTED NOT DETECTED Final   Norovirus GI/GII NOT DETECTED NOT DETECTED Final   Rotavirus A NOT DETECTED NOT DETECTED Final   Sapovirus (I, II, IV, and V) NOT DETECTED NOT DETECTED Final    Comment: Performed at [facility], [address] [place], [place] [address]    Radiology Reports Ct Abdomen Pelvis Wo Contrast  Result Date: 11/15/2018 CLINICAL DATA:  Weakness.  Nausea and vomiting. EXAM: CT ABDOMEN AND PELVIS WITHOUT CONTRAST TECHNIQUE: Multidetector CT imaging of the abdomen and pelvis was performed following the standard protocol  without IV contrast. COMPARISON:  CT abdomen pelvis dated March 24, 2011. FINDINGS: Lower chest: No acute abnormality. Postinflammatory scarring and bronchiectasis in both lower lobes. Hepatobiliary: No focal liver abnormality. Small gallstones. No gallbladder wall thickening or biliary dilatation. Pancreas: 2.2 cm round hypodense lesion arising from the pancreatic body. No ductal dilatation or surrounding inflammatory changes. Spleen: Normal in size without focal abnormality. Adrenals/Urinary Tract: Adrenal glands are unremarkable. Moderate bilateral renal atrophy. Punctate right renal calculi. No hydronephrosis. Mild slightly asymmetric bladder wall thickening, likely due to chronic outlet obstruction. Stomach/Bowel: Small hiatal hernia. The stomach is otherwise within normal limits. Prominent stool in the rectosigmoid colon with large stool ball in the rectum. Mild inflammatory changes surrounding the rectosigmoid colon. More liquid stool in the remaining colon. Left-sided colonic diverticulosis. The small bowel is unremarkable. Normal appendix. Vascular/Lymphatic: Aortic atherosclerosis. No enlarged abdominal or pelvic lymph nodes. Reproductive: Unchanged marked prostatomegaly. Other: Small fat containing right greater than left inguinal hernias, slightly increased when compared to prior study. Unchanged small fat containing paraumbilical hernia. Trace ascites in the left abdomen. No pneumoperitoneum. Musculoskeletal: No acute or significant osseous findings. Old right-sided rib fractures. Severe thoracolumbar spondylosis. IMPRESSION: 1. Fecal impaction with rectosigmoid stercoral colitis. 2. Unchanged cholelithiasis. 3. Indeterminate 2.2 cm hypodense lesion in the pancreatic body. Given the patient's age, no further follow-up is required. This recommendation follows ACR consensus guidelines: Management of Incidental Pancreatic Cysts: A White Paper of the ACR Incidental Findings Committee. Nadine  [id number]. 4. Punctate nonobstructive right nephrolithiasis. 5. Unchanged marked prostatomegaly with chronic bladder outlet obstruction. 6.  Aortic atherosclerosis (ICD10-I70.0). Electronically Signed   By: Titus Dubin M.D.   On: 11/15/2018 18:43   Dg Chest Port 1 View  Result Date: 11/15/2018 CLINICAL DATA:  85 year old male with lactic acidosis and vomiting. EXAM: PORTABLE CHEST 1 VIEW COMPARISON:  Chest radiograph dated 01/24/2012 FINDINGS: Shallow inspiration. No focal consolidation, pleural effusion, or pneumothorax. Background of emphysema. The cardiac silhouette is within normal limits. Atherosclerotic calcification of the aortic arch. No acute osseous pathology. IMPRESSION: No active disease. Electronically Signed   By: Anner Crete M.D.   On: 11/15/2018 22:30     CBC Recent Labs  Lab 11/15/18 1621 11/15/18 1642 11/16/18 0446 11/17/18 0654  WBC 9.2  --  12.1* 13.5*  HGB 13.4 13.6 13.0 11.9*  HCT 41.9 40.0 40.4 36.4*  PLT 188  --  175 167  MCV 99.5  --  100.5* 98.4  MCH 31.8  --  32.3 32.2  MCHC 32.0  --  32.2 32.7  RDW 13.2  --  13.2 13.8  LYMPHSABS 0.8  --   --   --   MONOABS 0.2  --   --   --   EOSABS 0.1  --   --   --   BASOSABS 0.0  --   --   --     Chemistries  Recent Labs  Lab 11/15/18 1621 11/15/18 1642 11/16/18 0446 11/17/18 0654  NA 139 138 141 140  K 4.5 4.5 5.1 4.1  CL 108 108 113* 114*  CO2 20*  --  15* 16*  GLUCOSE 229* 224* 287* 195*  BUN 43* 37* 55* 75*  CREATININE 2.24* 2.20* 2.33* 2.72*  CALCIUM 9.0  --  8.2* 7.8*  AST 26  --   --   --   ALT 23  --   --   --   ALKPHOS 76  --   --   --   BILITOT 0.8  --   --   --    ------------------------------------------------------------------------------------------------------------------ No results for input(s): CHOL, HDL, LDLCALC, TRIG, CHOLHDL, LDLDIRECT in the last 72 hours.  No results found for: HGBA1C  ------------------------------------------------------------------------------------------------------------------ No results for input(s): TSH, T4TOTAL, T3FREE, THYROIDAB in the last 72 hours.  Invalid input(s): FREET3 ------------------------------------------------------------------------------------------------------------------ No results for input(s): VITAMINB12, FOLATE, FERRITIN, TIBC, IRON, RETICCTPCT in the last 72 hours.  Coagulation profile No results for input(s): INR, PROTIME in the last 168 hours.  No results for input(s): DDIMER in the last 72 hours.  Cardiac Enzymes No results for input(s): CKMB, TROPONINI, MYOGLOBIN in the last 168 hours.  Invalid input(s): CK ------------------------------------------------------------------------------------------------------------------ No results found for: BNP   Roxan Hockey M.D on 11/17/2018 at 6:08 PM  Go to [email or web address] - for contact info  [facility] - Office  [phone]

## 2018-11-18 LAB — GLUCOSE, CAPILLARY
Glucose-Capillary: 81 mg/dL (ref 70–99)
Glucose-Capillary: 93 mg/dL (ref 70–99)
Glucose-Capillary: 90 mg/dL (ref 70–99)
Glucose-Capillary: 90 mg/dL (ref 70–99)

## 2018-11-18 LAB — CBC
HCT: 33.6 % — ABNORMAL LOW (ref 39.0–52.0)
Hemoglobin: 11.2 g/dL — ABNORMAL LOW (ref 13.0–17.0)
MCH: 32.3 pg (ref 26.0–34.0)
MCHC: 33.3 g/dL (ref 30.0–36.0)
MCV: 96.8 fL (ref 80.0–100.0)
Platelets: 158 10*3/uL (ref 150–400)
RBC: 3.47 MIL/uL — ABNORMAL LOW (ref 4.22–5.81)
RDW: 14 % (ref 11.5–15.5)
WBC: 9.5 10*3/uL (ref 4.0–10.5)
nRBC: 0 % (ref 0.0–0.2)

## 2018-11-18 LAB — BASIC METABOLIC PANEL
Anion gap: 8 (ref 5–15)
BUN: 68 mg/dL — ABNORMAL HIGH (ref 8–23)
CO2: 16 mmol/L — ABNORMAL LOW (ref 22–32)
Calcium: 7.7 mg/dL — ABNORMAL LOW (ref 8.9–10.3)
Chloride: 117 mmol/L — ABNORMAL HIGH (ref 98–111)
Creatinine, Ser: 2.21 mg/dL — ABNORMAL HIGH (ref 0.61–1.24)
GFR calc Af Amer: 29 mL/min — ABNORMAL LOW (ref >60)
GFR calc non Af Amer: 25 mL/min — ABNORMAL LOW (ref >60)
Glucose, Bld: 91 mg/dL (ref 70–99)
Potassium: 3.2 mmol/L — ABNORMAL LOW (ref 3.5–5.1)
Sodium: 141 mmol/L (ref 135–145)

## 2018-11-18 MED ORDER — CARVEDILOL 3.125 MG PO TABS
3.1250 mg | ORAL_TABLET | Freq: Two times a day (BID) | ORAL | Status: DC
  Administered 2018-11-18 – 2018-11-19 (×2): 3.125 mg via ORAL
  Filled 2018-11-18 (×2): qty 1

## 2018-11-18 MED ORDER — SENNOSIDES-DOCUSATE SODIUM 8.6-50 MG PO TABS
2.0000 | ORAL_TABLET | Freq: Once | ORAL | Status: AC
  Administered 2018-11-18: 18:00:00 2 via ORAL
  Filled 2018-11-18: qty 2

## 2018-11-18 MED ORDER — POTASSIUM CHLORIDE CRYS ER 20 MEQ PO TBCR
40.0000 meq | EXTENDED_RELEASE_TABLET | Freq: Once | ORAL | Status: AC
  Administered 2018-11-18: 18:00:00 40 meq via ORAL

## 2018-11-18 MED ORDER — POTASSIUM CHLORIDE CRYS ER 20 MEQ PO TBCR
40.0000 meq | EXTENDED_RELEASE_TABLET | Freq: Once | ORAL | Status: DC
  Filled 2018-11-18: qty 2

## 2018-11-18 NOTE — Plan of Care (Signed)
  Problem: Acute Rehab PT Goals(only PT should resolve) Goal: Pt Will Go Supine/Side To Sit Outcome: Progressing Flowsheets (Taken 11/18/2018 1115) Pt will go Supine/Side to Sit: with modified independence Goal: Pt Will Go Sit To Supine/Side Outcome: Progressing Flowsheets (Taken 11/18/2018 1115) Pt will go Sit to Supine/Side: with modified independence Goal: Patient Will Transfer Sit To/From Stand Outcome: Progressing Flowsheets (Taken 11/18/2018 1115) Patient will transfer sit to/from stand: with modified independence Note: With LRAD Goal: Pt Will Transfer Bed To Chair/Chair To Bed Outcome: Progressing Flowsheets (Taken 11/18/2018 1115) Pt will Transfer Bed to Chair/Chair to Bed: with modified independence Goal: Pt Will Ambulate Outcome: Progressing Flowsheets (Taken 11/18/2018 1115) Pt will Ambulate:  50 feet  with min guard assist  with least restrictive assistive device

## 2018-11-18 NOTE — Evaluation (Signed)
Physical Therapy Evaluation Patient Details Name: Riley Griffith MRN: [id number] DOB: [birth year] Today's Date: 11/18/2018   History of Present Illness  85 y.o. male with medical history significant for hypertension, diabetes mellitus, osteomyelitis and septic bursitis admitted on 11/15/2018 with intractable emesis, hypotension, and found to have stercoral colitis    Clinical Impression  [name] is a 85 y.o. male admitted for above diagnosis and history. The patient reports at baseline he is independent with household mobility using his wheelchair and uses his prosthetic and cane for short community mobility of 50 feet or less. He lives with his wife who is also independent and he is able to perform all ADL's independently. This session he required min assist with bed mobility to sit at EOB. He was able to don Rt LE prosthetic and Lt sock independently with no loss of seated balance. For sit<>stand transfer and short bout of gait with RW he required min guard and minimal cues for RW management. Patient is functioning just below his baseline and reports feeling weaker than normal for himself. He will benefit from additional home health therapy to progress strength to return to PLOF once he is medically safe and ready to discharge home. Acute PT will follow during his stay to progress mobility as able.    Follow Up Recommendations Home health PT    Equipment Recommendations  None recommended by PT    Recommendations for Other Services       Precautions / Restrictions Precautions Precautions: Fall Restrictions Weight Bearing Restrictions: No Other Position/Activity Restrictions: pt has Rt BKA with prosthetic in room      Mobility  Bed Mobility Overal bed mobility: Needs Assistance Bed Mobility: Supine to Sit     Supine to sit: Min assist;HOB elevated     General bed mobility comments: pt unable to press up independently from flat bed, requries slight elevation of HOB and min  assist to initiate press up from Rt sidelying  Transfers Overall transfer level: Needs assistance Equipment used: Rolling walker (2 wheeled) Transfers: Sit to/from Stand Sit to Stand: Min guard         General transfer comment: pt required cues for hand placement on bed and Rw for safe initiation of stand  Ambulation/Gait Ambulation/Gait assistance: Min guard Gait Distance (Feet): 6 Feet Assistive device: Rolling walker (2 wheeled) Gait Pattern/deviations: Step-through pattern;Decreased step length - left;Decreased step length - right;Decreased stride length;Shuffle     General Gait Details: pt with slow gait, able to negotiate forward steps and turning to transfers bed to chair with minimal cues for RW management      Balance Overall balance assessment: Needs assistance Sitting-balance support: Feet supported;No upper extremity supported Sitting balance-Leahy Scale: Good Sitting balance - Comments: pt able to don Rt prosthetic independently and sock on Lt foot independently   Standing balance support: Bilateral upper extremity supported;During functional activity Standing balance-Leahy Scale: Fair Standing balance comment: pt requries UE support to "click" into his prosthetic and for dynamic standing activity such as gait          Pertinent Vitals/Pain Pain Assessment: No/denies pain    Home Living Family/patient expects to be discharged to:: Private residence Living Arrangements: Spouse/significant other Available Help at Discharge: Family Type of Home: House Home Access: Ramped entrance     Home Layout: One level;Laundry or work area in basement(pt goes outside on tractor to get to basement through level back door entrance) Home Equipment: Environmental consultant - 2 wheels;Cane - quad;Cane - single  point;Bedside commode;Wheelchair - manual;Grab bars - tub/shower;Grab bars - toilet Additional Comments: Rt BKA prosthetic    Prior Function Level of Independence: Independent with  assistive device(s)         Comments: pt reports at home he uses his wheelchair to get around the house, he uses SPC to ambulate short distances in the community (50 feet max)     Hand Dominance   Dominant Hand: Right    Extremity/Trunk Assessment   Upper Extremity Assessment Upper Extremity Assessment: Overall WFL for tasks assessed    Lower Extremity Assessment Lower Extremity Assessment: RLE deficits/detail;Generalized weakness RLE Deficits / Details: Rt BKA 20+ years ago    Cervical / Trunk Assessment Cervical / Trunk Assessment: Kyphotic  Communication   Communication: No difficulties  Cognition Arousal/Alertness: Awake/alert Behavior During Therapy: WFL for tasks assessed/performed Overall Cognitive Status: Within Functional Limits for tasks assessed                  Assessment/Plan    PT Assessment Patient needs continued PT services  PT Problem List Decreased strength;Decreased balance;Decreased activity tolerance;Decreased mobility       PT Treatment Interventions Gait training;DME instruction;Functional mobility training;Balance training;Patient/family education;Therapeutic activities;Neuromuscular re-education;Therapeutic exercise    PT Goals (Current goals can be found in the Care Plan section)  Acute Rehab PT Goals Patient Stated Goal: patient wants to get stronger and go home PT Goal Formulation: With patient Time For Goal Achievement: 11/24/18 Potential to Achieve Goals: Good    Frequency Min 3X/week    AM-PAC PT "6 Clicks" Mobility  Outcome Measure Help needed turning from your back to your side while in a flat bed without using bedrails?: A Little Help needed moving from lying on your back to sitting on the side of a flat bed without using bedrails?: A Little Help needed moving to and from a bed to a chair (including a wheelchair)?: A Little Help needed standing up from a chair using your arms (e.g., wheelchair or bedside chair)?: A  Little Help needed to walk in hospital room?: A Little Help needed climbing 3-5 steps with a railing? : A Lot 6 Click Score: 17    End of Session Equipment Utilized During Treatment: Gait belt;Other (comment)(Rt LE prosthetic)   Patient left: in chair;with call bell/phone within reach;with chair alarm set(posey box not in room, alerted RN that chair alarm set up but posey box needed) Nurse Communication: Mobility status PT Visit Diagnosis: Unsteadiness on feet (R26.81);Other abnormalities of gait and mobility (R26.89);Muscle weakness (generalized) (M62.81);Difficulty in walking, not elsewhere classified (R26.2)    Time: 0814-4818 PT Time Calculation (min) (ACUTE ONLY): 37 min   Charges:   PT Evaluation $PT Eval Low Complexity: 1 Low PT Treatments $Therapeutic Activity: 8-22 mins       Kipp Brood, PT, DPT, [facility] Physical Therapist with [facility]  11/18/2018 11:14 AM

## 2018-11-18 NOTE — Progress Notes (Signed)
Patient Demographics:    Riley Griffith, is a 85 y.o. male, DOB - 07-Oct-1923, YIF:[id number]  Admit date - 11/15/2018   Admitting Physician Bethena Roys, MD  Outpatient Primary MD for the patient is [name], MD  LOS - 2   Chief Complaint  Patient presents with   Hypotension        Subjective:    Riley Griffith today has no fevers, no emesis,  No chest pain, complains of nausea, not eating well at this time, complaints of abdominal discomfort, rectal tube with significant amount of liquid stool  Assessment  & Plan :    Principal Problem:   Stercoral Colitis Active Problems:   Generalized weakness   History of right above knee amputation [facility])  Brief Summary:- 85 y.o. male with medical history significant for hypertension, diabetes mellitus, osteomyelitis and septic bursitis admitted on 11/15/2018 with intractable emesis, hypotension, and found to have stercoral colitis  A/p 1)Stercoral Colitis--WBC is down to 9.5 from 13.5.1, remains afebrile, CT abdomen pelvic findings noted, rectal tube with liquid stool, GI pathogen NGTD, PTA patient did not really have diarrhea --continue Rocephin and Flagyl started on 11/15/2018 --Evidence of fecal impaction and stercoral colitis on abdominal imaging studies, continue laxatives as ordered  2)AKI----acute kidney injury on CKD stage - IV due to transient hypotension and dehydration,     creatinine on admission= 2.2,   baseline creatinine = creatinine was 2.1 on 01/24/2012,    , creatinine is now= 2.2 (peak was 2.72) , renally adjust medications, avoid nephrotoxic agents/dehydration/hypotension  --- Hold lisinopril and Lasix -c/n IVF  3)DM2 -no recent A1c available, continue Lantus insulin 10 units daily, Use Novolog/Humalog Sliding scale insulin with Accu-Cheks/Fingersticks as ordered   4)HTN--blood pressure improving,  continue to hold amlodipine,  and  lisinopril as well as Lasix --- Apparently patient may have been having hypotension at home, no indication for amlodipine given home BPs have been low as well --Okay to give Coreg 3.125 mg twice daily, avoid abrupt discontinuation of beta-blockers  5) generalized weakness/deconditioning----patient with right BKA prosthesis,   PT eval appreciated  Disposition/Need for in-Hospital Stay- patient unable to be discharged at this time due to stercoral colitis  requiring IV antibiotics and follow-up disimpaction and laxative use --Possible discharge home on 11/19/2018 after further disimpaction and additional doses of IV antibiotics  Code Status : Full  Family Communication:   (patient is alert, awake and coherent) Discussed with Pt son Mr Braiden Rodman--- [phone]  Disposition Plan  : Home with home health Consults  :  na  DVT Prophylaxis  :  Lovenox -  SCDs   Lab Results  Component Value Date   PLT 158 11/18/2018    Inpatient Medications  Scheduled Meds:  aspirin  81 mg Oral Daily   brimonidine  1 drop Left Eye BID   enoxaparin (LOVENOX) injection  30 mg Subcutaneous Q24H   insulin aspart  0-9 Units Subcutaneous TID WC   insulin glargine  10 Units Subcutaneous Daily   latanoprost  1 drop Left Eye QHS   polyethylene glycol  17 g Oral BID   potassium chloride  40 mEq Oral Once   potassium chloride  40 mEq Oral Once   senna-docusate  2  tablet Oral Once   sodium bicarbonate  650 mg Oral BID   Continuous Infusions:  sodium chloride 40 mL/hr at 11/18/18 1441   cefTRIAXone (ROCEPHIN)  IV 2 g (11/17/18 1734)   metronidazole 500 mg (11/18/18 1200)   PRN Meds:.acetaminophen **OR** acetaminophen, ondansetron **OR** ondansetron (ZOFRAN) IV, traZODone    Anti-infectives (From admission, onward)   Start     Dose/Rate Route Frequency Ordered Stop   11/17/18 2000  vancomycin (VANCOCIN) 1,250 mg in sodium chloride 0.9 % 250 mL IVPB  Status:  Discontinued     1,250  mg 166.7 mL/hr over 90 Minutes Intravenous Every 48 hours 11/15/18 1949 11/16/18 0413   11/16/18 2000  ceFEPIme (MAXIPIME) 2 g in sodium chloride 0.9 % 100 mL IVPB  Status:  Discontinued     2 g 200 mL/hr over 30 Minutes Intravenous Every 24 hours 11/15/18 1946 11/16/18 0413   11/16/18 1900  cefTRIAXone (ROCEPHIN) 1 g in sodium chloride 0.9 % 100 mL IVPB  Status:  Discontinued     1 g 200 mL/hr over 30 Minutes Intravenous Every 24 hours 11/16/18 0413 11/16/18 0735   11/16/18 1800  cefTRIAXone (ROCEPHIN) 2 g in sodium chloride 0.9 % 100 mL IVPB     2 g 200 mL/hr over 30 Minutes Intravenous Every 24 hours 11/16/18 0735     11/16/18 0415  metroNIDAZOLE (FLAGYL) IVPB 500 mg     500 mg 100 mL/hr over 60 Minutes Intravenous Every 8 hours 11/16/18 0413     11/15/18 2030  vancomycin (VANCOCIN) 1,500 mg in sodium chloride 0.9 % 500 mL IVPB     1,500 mg 250 mL/hr over 120 Minutes Intravenous  Once 11/15/18 1947 11/16/18 0219   11/15/18 1945  ceFEPIme (MAXIPIME) 2 g in sodium chloride 0.9 % 100 mL IVPB     2 g 200 mL/hr over 30 Minutes Intravenous  Once 11/15/18 1931 11/15/18 2204   11/15/18 1945  metroNIDAZOLE (FLAGYL) IVPB 500 mg     500 mg 100 mL/hr over 60 Minutes Intravenous  Once 11/15/18 1931 11/15/18 2230   11/15/18 1945  vancomycin (VANCOCIN) IVPB 1000 mg/200 mL premix  Status:  Discontinued     1,000 mg 200 mL/hr over 60 Minutes Intravenous  Once 11/15/18 1931 11/15/18 1947        Objective:   Vitals:   11/17/18 1941 11/17/18 2124 11/18/18 0501 11/18/18 1639  BP:  123/61 136/73 (!) 137/56  Pulse:  88 87 85  Resp:  20 17 20   Temp:  99 F (37.2 C) 98.5 F (36.9 C) 98.4 F (36.9 C)  TempSrc:  Oral Oral Oral  SpO2: 94% 95% 99% 95%  Weight:      Height:        Wt Readings from Last 3 Encounters:  11/15/18 77.1 kg  01/25/12 77.1 kg  01/24/12 77.1 kg     Intake/Output Summary (Last 24 hours) at 11/18/2018 1754 Last data filed at 11/17/2018 2200 Gross per 24 hour  Intake  --  Output 1000 ml  Net -1000 ml     Physical Exam  Gen:- Awake Alert,  In no apparent distress  HEENT:- [place].AT, No sclera icterus Neck-Supple Neck,No JVD,.  Lungs-  CTAB , fair symmetrical air movement CV- S1, S2 normal, regular  Abd-  +ve B.Sounds, Abd Soft, mild lower abdominal tenderness without rebound or guarding, no CVA area tenderness Extremity/Skin:- No  edema, pedal pulses present  Psych-affect is appropriate, oriented x3 Neuro-no new focal deficits, no tremors  MSK--- right BKA Rectal--rectal tube with liquid stool  Data Review:   Micro Results Recent Results (from the past 240 hour(s))  Blood Culture (routine x 2)     Status: None (Preliminary result)   Collection Time: 11/15/18  7:50 PM   Specimen: BLOOD  Result Value Ref Range Status   Specimen Description BLOOD RIGHT ANTECUBITAL  Final   Special Requests   Final    BOTTLES DRAWN AEROBIC AND ANAEROBIC Blood Culture adequate volume   Culture   Final    NO GROWTH 3 DAYS Performed at [facility], [address] [place], [place] [address]    Report Status PENDING  Incomplete  Blood Culture (routine x 2)     Status: None (Preliminary result)   Collection Time: 11/15/18  7:55 PM   Specimen: BLOOD  Result Value Ref Range Status   Specimen Description BLOOD RIGHT ANTECUBITAL  Final   Special Requests   Final    BOTTLES DRAWN AEROBIC AND ANAEROBIC Blood Culture adequate volume   Culture   Final    NO GROWTH 3 DAYS Performed at [facility], [address], [place], [place] [address]    Report Status PENDING  Incomplete  SARS Coronavirus 2 (CEPHEID - Performed in [facility] hospital lab), Hosp Order     Status: None   Collection Time: 11/15/18 11:19 PM   Specimen: Nasopharyngeal Swab  Result Value Ref Range Status   SARS Coronavirus 2 NEGATIVE NEGATIVE Final    Comment: (NOTE) If result is NEGATIVE SARS-CoV-2 target nucleic acids are NOT DETECTED. The SARS-CoV-2 RNA is generally detectable in upper and lower   respiratory specimens during the acute phase of infection. The lowest  concentration of SARS-CoV-2 viral copies this assay can detect is 250  copies / mL. A negative result does not preclude SARS-CoV-2 infection  and should not be used as the sole basis for treatment or other  patient management decisions.  A negative result may occur with  improper specimen collection / handling, submission of specimen other  than nasopharyngeal swab, presence of viral mutation(s) within the  areas targeted by this assay, and inadequate number of viral copies  (<250 copies / mL). A negative result must be combined with clinical  observations, patient history, and epidemiological information. If result is POSITIVE SARS-CoV-2 target nucleic acids are DETECTED. The SARS-CoV-2 RNA is generally detectable in upper and lower  respiratory specimens dur ing the acute phase of infection.  Positive  results are indicative of active infection with SARS-CoV-2.  Clinical  correlation with patient history and other diagnostic information is  necessary to determine patient infection status.  Positive results do  not rule out bacterial infection or co-infection with other viruses. If result is PRESUMPTIVE POSTIVE SARS-CoV-2 nucleic acids MAY BE PRESENT.   A presumptive positive result was obtained on the submitted specimen  and confirmed on repeat testing.  While 2019 novel coronavirus  (SARS-CoV-2) nucleic acids may be present in the submitted sample  additional confirmatory testing may be necessary for epidemiological  and / or clinical management purposes  to differentiate between  SARS-CoV-2 and other Sarbecovirus currently known to infect humans.  If clinically indicated additional testing with an alternate test  methodology [phone]) is advised. The SARS-CoV-2 RNA is generally  detectable in upper and lower respiratory sp ecimens during the acute  phase of infection. The expected result is Negative. Fact  Sheet for Patients:  StrictlyIdeas.no Fact Sheet for Healthcare Providers: BankingDealers.co.za This test is not yet  approved or cleared by the [place] and has been authorized for detection and/or diagnosis of SARS-CoV-2 by FDA under an Emergency Use Authorization (EUA).  This EUA will remain in effect (meaning this test can be used) for the duration of the COVID-19 declaration under Section 564(b)(1) of the Act, 21 U.S.C. section 360bbb-3(b)(1), unless the authorization is terminated or revoked sooner. Performed at [facility], [address] [place], [place] [address]   Gastrointestinal Panel by PCR , Stool     Status: None   Collection Time: 11/16/18  1:02 PM   Specimen: STOOL  Result Value Ref Range Status   Campylobacter species NOT DETECTED NOT DETECTED Final   Plesimonas shigelloides NOT DETECTED NOT DETECTED Final   Salmonella species NOT DETECTED NOT DETECTED Final   Yersinia enterocolitica NOT DETECTED NOT DETECTED Final   Vibrio species NOT DETECTED NOT DETECTED Final   Vibrio cholerae NOT DETECTED NOT DETECTED Final   Enteroaggregative E coli (EAEC) NOT DETECTED NOT DETECTED Final   Enteropathogenic E coli (EPEC) NOT DETECTED NOT DETECTED Final   Enterotoxigenic E coli (ETEC) NOT DETECTED NOT DETECTED Final   Shiga like toxin producing E coli (STEC) NOT DETECTED NOT DETECTED Final   Shigella/Enteroinvasive E coli (EIEC) NOT DETECTED NOT DETECTED Final   Cryptosporidium NOT DETECTED NOT DETECTED Final   Cyclospora cayetanensis NOT DETECTED NOT DETECTED Final   Entamoeba histolytica NOT DETECTED NOT DETECTED Final   Giardia lamblia NOT DETECTED NOT DETECTED Final   Adenovirus F40/41 NOT DETECTED NOT DETECTED Final   Astrovirus NOT DETECTED NOT DETECTED Final   Norovirus GI/GII NOT DETECTED NOT DETECTED Final   Rotavirus A NOT DETECTED NOT DETECTED Final   Sapovirus (I, II, IV, and V) NOT DETECTED NOT DETECTED Final      Comment: Performed at [facility], [address], [place], [place] [address]    Radiology Reports Ct Abdomen Pelvis Wo Contrast  Result Date: 11/15/2018 CLINICAL DATA:  Weakness.  Nausea and vomiting. EXAM: CT ABDOMEN AND PELVIS WITHOUT CONTRAST TECHNIQUE: Multidetector CT imaging of the abdomen and pelvis was performed following the standard protocol without IV contrast. COMPARISON:  CT abdomen pelvis dated March 24, 2011. FINDINGS: Lower chest: No acute abnormality. Postinflammatory scarring and bronchiectasis in both lower lobes. Hepatobiliary: No focal liver abnormality. Small gallstones. No gallbladder wall thickening or biliary dilatation. Pancreas: 2.2 cm round hypodense lesion arising from the pancreatic body. No ductal dilatation or surrounding inflammatory changes. Spleen: Normal in size without focal abnormality. Adrenals/Urinary Tract: Adrenal glands are unremarkable. Moderate bilateral renal atrophy. Punctate right renal calculi. No hydronephrosis. Mild slightly asymmetric bladder wall thickening, likely due to chronic outlet obstruction. Stomach/Bowel: Small hiatal hernia. The stomach is otherwise within normal limits. Prominent stool in the rectosigmoid colon with large stool ball in the rectum. Mild inflammatory changes surrounding the rectosigmoid colon. More liquid stool in the remaining colon. Left-sided colonic diverticulosis. The small bowel is unremarkable. Normal appendix. Vascular/Lymphatic: Aortic atherosclerosis. No enlarged abdominal or pelvic lymph nodes. Reproductive: Unchanged marked prostatomegaly. Other: Small fat containing right greater than left inguinal hernias, slightly increased when compared to prior study. Unchanged small fat containing paraumbilical hernia. Trace ascites in the left abdomen. No pneumoperitoneum. Musculoskeletal: No acute or significant osseous findings. Old right-sided rib fractures. Severe thoracolumbar spondylosis. IMPRESSION: 1.  Fecal impaction with rectosigmoid stercoral colitis. 2. Unchanged cholelithiasis. 3. Indeterminate 2.2 cm hypodense lesion in the pancreatic body. Given the patient's age, no further follow-up is required. This recommendation follows ACR consensus  guidelines: Management of Incidental Pancreatic Cysts: A White Paper of the ACR Incidental Findings Committee. [place] [id number]. 4. Punctate nonobstructive right nephrolithiasis. 5. Unchanged marked prostatomegaly with chronic bladder outlet obstruction. 6.  Aortic atherosclerosis (ICD10-I70.0). Electronically Signed   By: Titus Dubin M.D.   On: 11/15/2018 18:43   Dg Chest Port 1 View  Result Date: 11/15/2018 CLINICAL DATA:  85 year old male with lactic acidosis and vomiting. EXAM: PORTABLE CHEST 1 VIEW COMPARISON:  Chest radiograph dated 01/24/2012 FINDINGS: Shallow inspiration. No focal consolidation, pleural effusion, or pneumothorax. Background of emphysema. The cardiac silhouette is within normal limits. Atherosclerotic calcification of the aortic arch. No acute osseous pathology. IMPRESSION: No active disease. Electronically Signed   By: Anner Crete M.D.   On: 11/15/2018 22:30     CBC Recent Labs  Lab 11/15/18 1621 11/15/18 1642 11/16/18 0446 11/17/18 0654 11/18/18 0607  WBC 9.2  --  12.1* 13.5* 9.5  HGB 13.4 13.6 13.0 11.9* 11.2*  HCT 41.9 40.0 40.4 36.4* 33.6*  PLT 188  --  175 167 158  MCV 99.5  --  100.5* 98.4 96.8  MCH 31.8  --  32.3 32.2 32.3  MCHC 32.0  --  32.2 32.7 33.3  RDW 13.2  --  13.2 13.8 14.0  LYMPHSABS 0.8  --   --   --   --   MONOABS 0.2  --   --   --   --   EOSABS 0.1  --   --   --   --   BASOSABS 0.0  --   --   --   --     Chemistries  Recent Labs  Lab 11/15/18 1621 11/15/18 1642 11/16/18 0446 11/17/18 0654 11/18/18 0607  NA 139 138 141 140 141  K 4.5 4.5 5.1 4.1 3.2*  CL 108 108 113* 114* 117*  CO2 20*  --  15* 16* 16*  GLUCOSE 229* 224* 287* 195* 91  BUN 43* 37* 55* 75* 68*    CREATININE 2.24* 2.20* 2.33* 2.72* 2.21*  CALCIUM 9.0  --  8.2* 7.8* 7.7*  AST 26  --   --   --   --   ALT 23  --   --   --   --   ALKPHOS 76  --   --   --   --   BILITOT 0.8  --   --   --   --    ------------------------------------------------------------------------------------------------------------------ No results for input(s): CHOL, HDL, LDLCALC, TRIG, CHOLHDL, LDLDIRECT in the last 72 hours.  No results found for: HGBA1C ------------------------------------------------------------------------------------------------------------------ No results for input(s): TSH, T4TOTAL, T3FREE, THYROIDAB in the last 72 hours.  Invalid input(s): FREET3 ------------------------------------------------------------------------------------------------------------------ No results for input(s): VITAMINB12, FOLATE, FERRITIN, TIBC, IRON, RETICCTPCT in the last 72 hours.  Coagulation profile No results for input(s): INR, PROTIME in the last 168 hours.  No results for input(s): DDIMER in the last 72 hours.  Cardiac Enzymes No results for input(s): CKMB, TROPONINI, MYOGLOBIN in the last 168 hours.  Invalid input(s): CK ------------------------------------------------------------------------------------------------------------------ No results found for: BNP   Roxan Hockey M.D on 11/18/2018 at 5:54 PM  Go to [email or web address] - for contact info  [facility] - Office  [phone]

## 2018-11-19 LAB — BASIC METABOLIC PANEL
Anion gap: 8 (ref 5–15)
BUN: 60 mg/dL — ABNORMAL HIGH (ref 8–23)
CO2: 17 mmol/L — ABNORMAL LOW (ref 22–32)
Calcium: 7.5 mg/dL — ABNORMAL LOW (ref 8.9–10.3)
Chloride: 118 mmol/L — ABNORMAL HIGH (ref 98–111)
Creatinine, Ser: 1.85 mg/dL — ABNORMAL HIGH (ref 0.61–1.24)
GFR calc Af Amer: 36 mL/min — ABNORMAL LOW (ref >60)
GFR calc non Af Amer: 31 mL/min — ABNORMAL LOW (ref >60)
Glucose, Bld: 86 mg/dL (ref 70–99)
Potassium: 3.6 mmol/L (ref 3.5–5.1)
Sodium: 143 mmol/L (ref 135–145)

## 2018-11-19 LAB — GLUCOSE, CAPILLARY
Glucose-Capillary: 79 mg/dL (ref 70–99)
Glucose-Capillary: 92 mg/dL (ref 70–99)

## 2018-11-19 MED ORDER — ONDANSETRON 4 MG PO TBDP: 4.0000 mg | ORAL_TABLET | Freq: Three times a day (TID) | ORAL | 0 refills | Status: DC | PRN

## 2018-11-19 MED ORDER — METRONIDAZOLE 500 MG PO TABS
500.0000 mg | ORAL_TABLET | Freq: Once | ORAL | Status: AC
  Administered 2018-11-19: 14:00:00 500 mg via ORAL
  Filled 2018-11-19: qty 1

## 2018-11-19 MED ORDER — CEFDINIR 300 MG PO CAPS: 300.0000 mg | ORAL_CAPSULE | Freq: Every day | ORAL | 0 refills | Status: AC

## 2018-11-19 MED ORDER — SENNOSIDES-DOCUSATE SODIUM 8.6-50 MG PO TABS: 2.0000 | ORAL_TABLET | Freq: Every day | ORAL | 1 refills | Status: AC

## 2018-11-19 MED ORDER — ACETAMINOPHEN 325 MG PO TABS: 650.0000 mg | ORAL_TABLET | Freq: Four times a day (QID) | ORAL | 0 refills | Status: DC | PRN

## 2018-11-19 MED ORDER — SODIUM CHLORIDE 0.9 % IV SOLN
1.0000 g | Freq: Once | INTRAVENOUS | Status: AC
  Administered 2018-11-19: 1 g via INTRAVENOUS
  Filled 2018-11-19: qty 10

## 2018-11-19 MED ORDER — CARVEDILOL 3.125 MG PO TABS: 3.1250 mg | ORAL_TABLET | Freq: Two times a day (BID) | ORAL | 2 refills | Status: DC

## 2018-11-19 MED ORDER — METRONIDAZOLE 500 MG PO TABS: 500.0000 mg | ORAL_TABLET | Freq: Three times a day (TID) | ORAL | 0 refills | Status: AC

## 2018-11-19 NOTE — Progress Notes (Signed)
IV, telemetry removed. D/C instructions reviewed with patient and son Fritz Pickerel), verbalized understanding. Rectal tube and external catheter removed. Patient transported to private vehicle via wheelchair.

## 2018-11-19 NOTE — Discharge Instructions (Signed)
1)A physical therapist will come to your house a couple times a week to help you get stronger and get more steady on your feet 2)Please Stop Amlodipine due to concerns about blood pressure being too low with this medication 3)Please Decrease Coreg/carvedilol to 3.125 mg twice a day and okay to resume lisinopril at 10 mg daily for kidney protection 4) you have chronic kidney disease so avoid Avoid ibuprofen/Advil/Aleve/Motrin/Goody Powders/Naproxen/BC powders/Meloxicam/Diclofenac/Indomethacin and other Nonsteroidal anti-inflammatory medications as these will make you more likely to bleed and can cause stomach ulcers, can also cause Kidney problems.  5)Please  take Senokot 2 tablets at bedtime to prevent constipation 6)Please  follow-up to primary care physician in about a week for reevaluation and recheck and also to recheck your BMP/kidney and electrolyte blood test 7) please take Zofran/ondansetron as prescribed for nausea and vomiting 8) please take Omnicef and Flagyl/metronidazole antibiotics as prescribed for additional 5 days for your colitis/bowel infection 9) please avoid alcohol while taking Flagyl/metronidazole antibiotic

## 2018-11-19 NOTE — Discharge Summary (Signed)
Riley Griffith, is a 85 y.o. male  DOB [birth year]  MRN [id number].  Admission date:  11/15/2018  Admitting Physician  Bethena Roys, MD  Discharge Date:  11/19/2018   Primary MD  [name], MD  Recommendations for primary care physician for things to follow:   1)A physical therapist will come to your house a couple times a week to help you get stronger and get more steady on your feet 2)Please Stop Amlodipine due to concerns about blood pressure being too low with this medication 3)Please Decrease Coreg/carvedilol to 3.125 mg twice a day and okay to resume lisinopril at 10 mg daily for kidney protection 4) you have chronic kidney disease so avoid Avoid ibuprofen/Advil/Aleve/Motrin/Goody Powders/Naproxen/BC powders/Meloxicam/Diclofenac/Indomethacin and other Nonsteroidal anti-inflammatory medications as these will make you more likely to bleed and can cause stomach ulcers, can also cause Kidney problems.  5)Please  take Senokot 2 tablets at bedtime to prevent constipation 6)Please  follow-up to primary care physician in about a week for reevaluation and recheck and also to recheck your BMP/kidney and electrolyte blood test 7) please take Zofran/ondansetron as prescribed for nausea and vomiting 8) please take Omnicef and Flagyl/metronidazole antibiotics as prescribed for additional 5 days for your colitis/bowel infection 9) please avoid alcohol while taking Flagyl/metronidazole antibiotic  Admission Diagnosis  Lactic acidosis [E87.2] Near syncope [R55] Non-intractable vomiting with nausea, unspecified vomiting type [R11.2]   Discharge Diagnosis  Lactic acidosis [E87.2] Near syncope [R55] Non-intractable vomiting with nausea, unspecified vomiting type [R11.2]    Principal Problem:   Stercoral Colitis Active Problems:   Generalized weakness   History of right above knee amputation [facility])       Past Medical History:  Diagnosis Date   Diabetes mellitus    GERD (gastroesophageal reflux disease)    Hypertension    Osteomyelitis of foot ([place])    Renal disorder    see Dr Tawni Carnes    Past Surgical History:  Procedure Laterality Date   AMPUTATION  01/25/2012   Procedure: AMPUTATION BELOW KNEE;  Surgeon: Rozanna Box, MD;  Location: [facility];  Service: Orthopedics;  Laterality: Right;  REVISION OF BELOW KNEE  AMPUTATION    APPLICATION OF WOUND VAC  01/25/2012   Procedure: APPLICATION OF WOUND VAC;  Surgeon: Rozanna Box, MD;  Location: [facility];  Service: Orthopedics;  Laterality: N/A;  AND PLACEMENT OF WOUND VAC    LEG AMPUTATION BELOW KNEE     rt knee    HPI  from the history and physical done on the day of admission:    Chief Complaint: Dizziness, Vomiting  HPI: Riley Griffith is a 85 y.o. male with medical history significant for hypertension, diabetes mellitus, osteomyelitis and septic bursitis, who presented to the ED with complaints of dizziness, and vomiting.  [place] shopping with his wife today, he remained in the car with the [facility] on why his wife went into the store.  Patient began to feel lightheaded, he vomited twice today and has had several dry  heaving episodes today.  On EMS arrival, patient was hypotensive -exact numbers not documented.  500 mill bolus was given in route. Patient reports daily bowel movements, last bowel movement was yesterday, he denies hard or watery stools.  He denies abdominal pain.  But states his abdomen feels firm.  ED Course: Temperature 97.5, blood pressure on arrival 114/60, lowest recorded 105/66.  Lactic acid 3.4  >> 3.1.  Creatinine about baseline at 2.24, WBC 9.2.  Stable hemoglobin 13.4.  High-sensitivity troponin 17>> 14.  Lipase 40.  UA pending.  CT abdomen and pelvis shows fecal impaction with rectosigmoid stercoral colitis.  Patient was given a total of 1 L bolus in the ED.  Started on broad-spectrum antibiotics for  possible sepsis with IV vancomycin cefepime and metronidazole.  Hospitalist to admit.    Hospital Course:   Brief Summary:- 85 y.o.malewith medical history significant forhypertension, diabetes mellitus, osteomyelitis and septic bursitis admitted on 11/15/2018 with intractable emesis, hypotension, and found to have stercoral colitis  A/p 1)Stercoral Colitis--WBC is down to 9.5 from 13.5,  remains afebrile, CT abdomen pelvic findings noted,  GI pathogen Neg, PTA patient did not really have diarrhea --treated with iv Rocephin and Flagyl started on 11/15/2018, okay to discharge on Omnicef and Flagyl for additional 5 days --Evidence of fecal impaction and stercoral colitis on abdominal imaging studies--much improved with laxatives okay to discharge home on Senokot-S 2 tablets nightly to prevent further constipation/fecal impaction  2)AKI----acute kidney injury on CKD stage - IV--renal function worsened due to transient hypotension and dehydration,     creatinine on admission= 2.2,   baseline creatinine = creatinine was 2.1 on 01/24/2012,    , creatinine is now= 1.85 (peak was 2.72) , renally adjust medications, avoid nephrotoxic agents/dehydration/hypotension  --- Faythe Ghee to restart lisinopril and Lasix  3)DM2 -no recent A1c available, continue home insulin regimen including Lantus insulin 10 units daily,   4)HTN--stop amlodipine, decrease Coreg to 3.125 mg twice daily, and resume lisinopril 10 mg daily ------ Apparently patient may have been having hypotension at home.  5)Generalized weakness/deconditioning----patient with right BKA prosthesis,   PT eval appreciated, home health PT advised  Code Status : Full  Family Communication:   (patient is alert, awake and coherent) Discussed with Pt son Mr Zayne Draheim--- [phone]  Disposition Plan  : Home with home health PT Consults  :  na  Discharge Condition: stable  Follow UP--- PCP for repeat BMP and recheck  Diet and Activity  recommendation:  As advised  Discharge Instructions    Discharge Instructions    Call MD for:  difficulty breathing, headache or visual disturbances   Complete by: As directed    Call MD for:  persistant dizziness or light-headedness   Complete by: As directed    Call MD for:  persistant nausea and vomiting   Complete by: As directed    Call MD for:  severe uncontrolled pain   Complete by: As directed    Call MD for:  temperature >100.4   Complete by: As directed    Diet - low sodium heart healthy   Complete by: As directed    Discharge instructions   Complete by: As directed    1)A physical therapist will come to your house a couple times a week to help you get stronger and get more steady on your feet 2)Please Stop Amlodipine due to concerns about blood pressure being too low with this medication 3)Please Decrease Coreg/carvedilol to 3.125 mg twice a day and  okay to resume lisinopril at 10 mg daily for kidney protection 4) you have chronic kidney disease so avoid Avoid ibuprofen/Advil/Aleve/Motrin/Goody Powders/Naproxen/BC powders/Meloxicam/Diclofenac/Indomethacin and other Nonsteroidal anti-inflammatory medications as these will make you more likely to bleed and can cause stomach ulcers, can also cause Kidney problems.  5)Please  take Senokot 2 tablets at bedtime to prevent constipation 6)Please  follow-up to primary care physician in about a week for reevaluation and recheck and also to recheck your BMP/kidney and electrolyte blood test 7) please take Zofran/ondansetron as prescribed for nausea and vomiting 8) please take Omnicef and Flagyl/metronidazole antibiotics as prescribed for additional 5 days for your colitis/bowel infection 9) please avoid alcohol while taking Flagyl/metronidazole antibiotic   Increase activity slowly   Complete by: As directed       Discharge Medications     Allergies as of 11/19/2018   No Known Allergies     Medication List    STOP taking these  medications   amLODipine 10 MG tablet Commonly known as: NORVASC   aspirin 81 MG chewable tablet     TAKE these medications   acetaminophen 325 MG tablet Commonly known as: TYLENOL Take 2 tablets (650 mg total) by mouth every 6 (six) hours as needed for mild pain, fever or headache (or Fever >/= 101). What changed:   medication strength  how much to take  reasons to take this   bimatoprost 0.01 % Soln Commonly known as: LUMIGAN Place 1 drop into the left eye at bedtime.   brimonidine 0.1 % Soln Commonly known as: ALPHAGAN P Place 1 drop into the left eye 2 (two) times daily.   calcitRIOL 0.25 MCG capsule Commonly known as: ROCALTROL Take 0.25 mcg by mouth daily.   carvedilol 3.125 MG tablet Commonly known as: COREG Take 1 tablet (3.125 mg total) by mouth 2 (two) times daily with a meal. What changed:   medication strength  how much to take  when to take this   cefdinir 300 MG capsule Commonly known as: OMNICEF Take 1 capsule (300 mg total) by mouth daily for 5 days. For Bowel infection   cholecalciferol 1000 units tablet Commonly known as: VITAMIN D Take 1,000 Units by mouth daily.   co-enzyme Q-10 30 MG capsule Take 30 mg by mouth daily.   ferrous sulfate 325 (65 FE) MG tablet Take 325 mg by mouth daily with breakfast.   furosemide 20 MG tablet Commonly known as: LASIX Take 20 mg by mouth daily. For swelling   insulin glargine 100 UNIT/ML injection Commonly known as: LANTUS Inject 10 Units into the skin at bedtime.   lisinopril 10 MG tablet Commonly known as: ZESTRIL Take 10 mg by mouth daily.   metroNIDAZOLE 500 MG tablet Commonly known as: Flagyl Take 1 tablet (500 mg total) by mouth 3 (three) times daily with meals for 5 days. For bowel infection   ondansetron 4 MG disintegrating tablet Commonly known as: Zofran ODT Take 1 tablet (4 mg total) by mouth every 8 (eight) hours as needed for nausea or vomiting.   senna-docusate 8.6-50 MG  tablet Commonly known as: Senokot-S Take 2 tablets by mouth at bedtime. For Bowels/Constipation--- skip if you have Loose stools   sodium bicarbonate 650 MG tablet Take 650 mg by mouth 2 (two) times daily.   tamsulosin 0.4 MG Caps capsule Commonly known as: FLOMAX Take 0.4 mg by mouth daily after supper.   vitamin C 500 MG tablet Commonly known as: ASCORBIC ACID Take 500 mg by mouth  daily.       Major procedures and Radiology Reports - PLEASE review detailed and final reports for all details, in brief -    Ct Abdomen Pelvis Wo Contrast  Result Date: 11/15/2018 CLINICAL DATA:  Weakness.  Nausea and vomiting. EXAM: CT ABDOMEN AND PELVIS WITHOUT CONTRAST TECHNIQUE: Multidetector CT imaging of the abdomen and pelvis was performed following the standard protocol without IV contrast. COMPARISON:  CT abdomen pelvis dated March 24, 2011. FINDINGS: Lower chest: No acute abnormality. Postinflammatory scarring and bronchiectasis in both lower lobes. Hepatobiliary: No focal liver abnormality. Small gallstones. No gallbladder wall thickening or biliary dilatation. Pancreas: 2.2 cm round hypodense lesion arising from the pancreatic body. No ductal dilatation or surrounding inflammatory changes. Spleen: Normal in size without focal abnormality. Adrenals/Urinary Tract: Adrenal glands are unremarkable. Moderate bilateral renal atrophy. Punctate right renal calculi. No hydronephrosis. Mild slightly asymmetric bladder wall thickening, likely due to chronic outlet obstruction. Stomach/Bowel: Small hiatal hernia. The stomach is otherwise within normal limits. Prominent stool in the rectosigmoid colon with large stool ball in the rectum. Mild inflammatory changes surrounding the rectosigmoid colon. More liquid stool in the remaining colon. Left-sided colonic diverticulosis. The small bowel is unremarkable. Normal appendix. Vascular/Lymphatic: Aortic atherosclerosis. No enlarged abdominal or pelvic lymph nodes.  Reproductive: Unchanged marked prostatomegaly. Other: Small fat containing right greater than left inguinal hernias, slightly increased when compared to prior study. Unchanged small fat containing paraumbilical hernia. Trace ascites in the left abdomen. No pneumoperitoneum. Musculoskeletal: No acute or significant osseous findings. Old right-sided rib fractures. Severe thoracolumbar spondylosis. IMPRESSION: 1. Fecal impaction with rectosigmoid stercoral colitis. 2. Unchanged cholelithiasis. 3. Indeterminate 2.2 cm hypodense lesion in the pancreatic body. Given the patient's age, no further follow-up is required. This recommendation follows ACR consensus guidelines: Management of Incidental Pancreatic Cysts: A White Paper of the ACR Incidental Findings Committee. [place] 2774;12:[id number]. 4. Punctate nonobstructive right nephrolithiasis. 5. Unchanged marked prostatomegaly with chronic bladder outlet obstruction. 6.  Aortic atherosclerosis (ICD10-I70.0). Electronically Signed   By: Titus Dubin M.D.   On: 11/15/2018 18:43   Dg Chest Port 1 View  Result Date: 11/15/2018 CLINICAL DATA:  85 year old male with lactic acidosis and vomiting. EXAM: PORTABLE CHEST 1 VIEW COMPARISON:  Chest radiograph dated 01/24/2012 FINDINGS: Shallow inspiration. No focal consolidation, pleural effusion, or pneumothorax. Background of emphysema. The cardiac silhouette is within normal limits. Atherosclerotic calcification of the aortic arch. No acute osseous pathology. IMPRESSION: No active disease. Electronically Signed   By: Anner Crete M.D.   On: 11/15/2018 22:30    Micro Results    Recent Results (from the past 240 hour(s))  Blood Culture (routine x 2)     Status: None (Preliminary result)   Collection Time: 11/15/18  7:50 PM   Specimen: BLOOD  Result Value Ref Range Status   Specimen Description BLOOD RIGHT ANTECUBITAL  Final   Special Requests   Final    BOTTLES DRAWN AEROBIC AND ANAEROBIC Blood Culture  adequate volume   Culture   Final    NO GROWTH 4 DAYS Performed at [facility], [address], [place], [place] [address]    Report Status PENDING  Incomplete  Blood Culture (routine x 2)     Status: None (Preliminary result)   Collection Time: 11/15/18  7:55 PM   Specimen: BLOOD  Result Value Ref Range Status   Specimen Description BLOOD RIGHT ANTECUBITAL  Final   Special Requests   Final    BOTTLES DRAWN AEROBIC AND ANAEROBIC  Blood Culture adequate volume   Culture   Final    NO GROWTH 4 DAYS Performed at [facility], [address] [place], [place] [address]    Report Status PENDING  Incomplete  SARS Coronavirus 2 (CEPHEID - Performed in [facility] hospital lab), Hosp Order     Status: None   Collection Time: 11/15/18 11:19 PM   Specimen: Nasopharyngeal Swab  Result Value Ref Range Status   SARS Coronavirus 2 NEGATIVE NEGATIVE Final    Comment: (NOTE) If result is NEGATIVE SARS-CoV-2 target nucleic acids are NOT DETECTED. The SARS-CoV-2 RNA is generally detectable in upper and lower  respiratory specimens during the acute phase of infection. The lowest  concentration of SARS-CoV-2 viral copies this assay can detect is 250  copies / mL. A negative result does not preclude SARS-CoV-2 infection  and should not be used as the sole basis for treatment or other  patient management decisions.  A negative result may occur with  improper specimen collection / handling, submission of specimen other  than nasopharyngeal swab, presence of viral mutation(s) within the  areas targeted by this assay, and inadequate number of viral copies  (<250 copies / mL). A negative result must be combined with clinical  observations, patient history, and epidemiological information. If result is POSITIVE SARS-CoV-2 target nucleic acids are DETECTED. The SARS-CoV-2 RNA is generally detectable in upper and lower  respiratory specimens dur ing the acute phase of infection.  Positive  results are  indicative of active infection with SARS-CoV-2.  Clinical  correlation with patient history and other diagnostic information is  necessary to determine patient infection status.  Positive results do  not rule out bacterial infection or co-infection with other viruses. If result is PRESUMPTIVE POSTIVE SARS-CoV-2 nucleic acids MAY BE PRESENT.   A presumptive positive result was obtained on the submitted specimen  and confirmed on repeat testing.  While 2019 novel coronavirus  (SARS-CoV-2) nucleic acids may be present in the submitted sample  additional confirmatory testing may be necessary for epidemiological  and / or clinical management purposes  to differentiate between  SARS-CoV-2 and other Sarbecovirus currently known to infect humans.  If clinically indicated additional testing with an alternate test  methodology [phone]) is advised. The SARS-CoV-2 RNA is generally  detectable in upper and lower respiratory sp ecimens during the acute  phase of infection. The expected result is Negative. Fact Sheet for Patients:  StrictlyIdeas.no Fact Sheet for Healthcare Providers: BankingDealers.co.za This test is not yet approved or cleared by the [place] FDA and has been authorized for detection and/or diagnosis of SARS-CoV-2 by FDA under an Emergency Use Authorization (EUA).  This EUA will remain in effect (meaning this test can be used) for the duration of the COVID-19 declaration under Section 564(b)(1) of the Act, 21 U.S.C. section 360bbb-3(b)(1), unless the authorization is terminated or revoked sooner. Performed at [facility], [address] [place], [place] [address]   Gastrointestinal Panel by PCR , Stool     Status: None   Collection Time: 11/16/18  1:02 PM   Specimen: STOOL  Result Value Ref Range Status   Campylobacter species NOT DETECTED NOT DETECTED Final   Plesimonas shigelloides NOT DETECTED NOT DETECTED Final    Salmonella species NOT DETECTED NOT DETECTED Final   Yersinia enterocolitica NOT DETECTED NOT DETECTED Final   Vibrio species NOT DETECTED NOT DETECTED Final   Vibrio cholerae NOT DETECTED NOT DETECTED Final   Enteroaggregative E coli (EAEC) NOT DETECTED NOT  DETECTED Final   Enteropathogenic E coli (EPEC) NOT DETECTED NOT DETECTED Final   Enterotoxigenic E coli (ETEC) NOT DETECTED NOT DETECTED Final   Shiga like toxin producing E coli (STEC) NOT DETECTED NOT DETECTED Final   Shigella/Enteroinvasive E coli (EIEC) NOT DETECTED NOT DETECTED Final   Cryptosporidium NOT DETECTED NOT DETECTED Final   Cyclospora cayetanensis NOT DETECTED NOT DETECTED Final   Entamoeba histolytica NOT DETECTED NOT DETECTED Final   Giardia lamblia NOT DETECTED NOT DETECTED Final   Adenovirus F40/41 NOT DETECTED NOT DETECTED Final   Astrovirus NOT DETECTED NOT DETECTED Final   Norovirus GI/GII NOT DETECTED NOT DETECTED Final   Rotavirus A NOT DETECTED NOT DETECTED Final   Sapovirus (I, II, IV, and V) NOT DETECTED NOT DETECTED Final    Comment: Performed at [facility], [address], [place], [place] [address]       Today   Subjective    Hurley Ducre today has no new complaints, internal drinking better, no fevers no chills, no emesis          Patient has been seen and examined prior to discharge   Objective   Blood pressure (!) 144/69, pulse 64, temperature 98.4 F (36.9 C), temperature source Oral, resp. rate 16, height 5\' 11"  (1.803 m), weight 77.1 kg, SpO2 99 %.   Intake/Output Summary (Last 24 hours) at 11/19/2018 1421 Last data filed at 11/19/2018 0919 Gross per 24 hour  Intake 480 ml  Output 1400 ml  Net -920 ml    Exam Gen:- Awake Alert, no acute distress  HEENT:- [place].AT, No sclera icterus Neck-Supple Neck,No JVD,.  Lungs-  CTAB , good air movement bilaterally  CV- S1, S2 normal, regular Abd-  +ve B.Sounds, Abd Soft, No tenderness,    Extremity/Skin:- No  edema,   good  pulses Psych-affect is appropriate, oriented x3 Neuro-no new focal deficits, no tremors  MSK--right BKA   Data Review   CBC w Diff:  Lab Results  Component Value Date   WBC 9.5 11/18/2018   HGB 11.2 (L) 11/18/2018   HCT 33.6 (L) 11/18/2018   PLT 158 11/18/2018   LYMPHOPCT 9 11/15/2018   MONOPCT 2 11/15/2018   EOSPCT 1 11/15/2018   BASOPCT 0 11/15/2018    CMP:  Lab Results  Component Value Date   NA 143 11/19/2018   K 3.6 11/19/2018   CL 118 (H) 11/19/2018   CO2 17 (L) 11/19/2018   BUN 60 (H) 11/19/2018   CREATININE 1.85 (H) 11/19/2018   CREATININE 2.17 (H) 01/11/2012   PROT 6.1 (L) 11/15/2018   ALBUMIN 3.5 11/15/2018   BILITOT 0.8 11/15/2018   ALKPHOS 76 11/15/2018   AST 26 11/15/2018   ALT 23 11/15/2018  .   Total Discharge time is about 33 minutes  Roxan Hockey M.D on 11/19/2018 at 2:21 PM  Go to [email or web address] -  for contact info  [facility] - Office  [phone]

## 2018-11-19 NOTE — TOC Transition Note (Signed)
Transition of Care [facility]) - CM/SW Discharge Note   Patient Details  Name: KODAH MARET MRN: [id number] Date of Birth: 02/27/1924  Transition [facility]) CM/SW Contact:  Latanya Maudlin, RN Phone Number: 11/19/2018, 1:12 PM   Clinical Narrative:  Patient to be discharged per MD order. Orders in place for home health services. CMS Medicare.gov Compare Post Acute Care list reviewed with patient and he has no preference of agency. Referral to [facility] with [place] care. No DME needs. Family to transport.      Final next level of care: Home w Home Health Services Barriers to Discharge: No Barriers Identified   Patient Goals and CMS Choice   CMS Medicare.gov Compare Post Acute Care list provided to:: Patient Choice offered to / list presented to : Patient  Discharge Placement                       Discharge Plan and Services                          HH Arranged: PT [facility] Agency: [place] ([facility]) Date [place]: 11/19/18 Time [place]: 1312 Representative spoke with at [place]: [place] ([place]) Interventions     Readmission Risk Interventions Readmission Risk Prevention Plan 11/19/2018  Transportation Screening Complete  PCP or Specialist Appt within 5-7 Days Complete  Home Care Screening Complete  Medication Review (RN CM) Complete  Some recent data might be hidden

## 2018-11-20 DIAGNOSIS — Z4800 Encounter for change or removal of nonsurgical wound dressing: Secondary | ICD-10-CM | POA: Diagnosis not present

## 2018-11-20 DIAGNOSIS — Z794 Long term (current) use of insulin: Secondary | ICD-10-CM | POA: Diagnosis not present

## 2018-11-20 DIAGNOSIS — E1122 Type 2 diabetes mellitus with diabetic chronic kidney disease: Secondary | ICD-10-CM | POA: Diagnosis not present

## 2018-11-20 DIAGNOSIS — Z89611 Acquired absence of right leg above knee: Secondary | ICD-10-CM | POA: Diagnosis not present

## 2018-11-20 DIAGNOSIS — K5130 Ulcerative (chronic) rectosigmoiditis without complications: Secondary | ICD-10-CM | POA: Diagnosis not present

## 2018-11-20 DIAGNOSIS — N184 Chronic kidney disease, stage 4 (severe): Secondary | ICD-10-CM | POA: Diagnosis not present

## 2018-11-20 DIAGNOSIS — I10 Essential (primary) hypertension: Secondary | ICD-10-CM | POA: Diagnosis not present

## 2018-11-20 DIAGNOSIS — L89322 Pressure ulcer of left buttock, stage 2: Secondary | ICD-10-CM | POA: Diagnosis not present

## 2018-11-20 LAB — CULTURE, BLOOD (ROUTINE X 2)
Culture: NO GROWTH
Culture: NO GROWTH
Special Requests: ADEQUATE
Special Requests: ADEQUATE

## 2018-11-21 DIAGNOSIS — I10 Essential (primary) hypertension: Secondary | ICD-10-CM | POA: Diagnosis not present

## 2018-11-21 DIAGNOSIS — E1122 Type 2 diabetes mellitus with diabetic chronic kidney disease: Secondary | ICD-10-CM | POA: Diagnosis not present

## 2018-11-21 DIAGNOSIS — N184 Chronic kidney disease, stage 4 (severe): Secondary | ICD-10-CM | POA: Diagnosis not present

## 2018-11-21 DIAGNOSIS — Z794 Long term (current) use of insulin: Secondary | ICD-10-CM | POA: Diagnosis not present

## 2018-11-21 DIAGNOSIS — Z89611 Acquired absence of right leg above knee: Secondary | ICD-10-CM | POA: Diagnosis not present

## 2018-11-21 DIAGNOSIS — K5130 Ulcerative (chronic) rectosigmoiditis without complications: Secondary | ICD-10-CM | POA: Diagnosis not present

## 2018-11-22 DIAGNOSIS — I10 Essential (primary) hypertension: Secondary | ICD-10-CM | POA: Diagnosis not present

## 2018-11-22 DIAGNOSIS — N184 Chronic kidney disease, stage 4 (severe): Secondary | ICD-10-CM | POA: Diagnosis not present

## 2018-11-22 DIAGNOSIS — E1122 Type 2 diabetes mellitus with diabetic chronic kidney disease: Secondary | ICD-10-CM | POA: Diagnosis not present

## 2018-11-22 DIAGNOSIS — K5130 Ulcerative (chronic) rectosigmoiditis without complications: Secondary | ICD-10-CM | POA: Diagnosis not present

## 2018-11-22 DIAGNOSIS — Z89611 Acquired absence of right leg above knee: Secondary | ICD-10-CM | POA: Diagnosis not present

## 2018-11-22 DIAGNOSIS — Z794 Long term (current) use of insulin: Secondary | ICD-10-CM | POA: Diagnosis not present

## 2018-11-23 DIAGNOSIS — K5130 Ulcerative (chronic) rectosigmoiditis without complications: Secondary | ICD-10-CM | POA: Diagnosis not present

## 2018-11-23 DIAGNOSIS — Z794 Long term (current) use of insulin: Secondary | ICD-10-CM | POA: Diagnosis not present

## 2018-11-23 DIAGNOSIS — Z89611 Acquired absence of right leg above knee: Secondary | ICD-10-CM | POA: Diagnosis not present

## 2018-11-23 DIAGNOSIS — N184 Chronic kidney disease, stage 4 (severe): Secondary | ICD-10-CM | POA: Diagnosis not present

## 2018-11-23 DIAGNOSIS — I10 Essential (primary) hypertension: Secondary | ICD-10-CM | POA: Diagnosis not present

## 2018-11-23 DIAGNOSIS — E1122 Type 2 diabetes mellitus with diabetic chronic kidney disease: Secondary | ICD-10-CM | POA: Diagnosis not present

## 2018-11-27 ENCOUNTER — Other Ambulatory Visit: Payer: Self-pay

## 2018-11-27 DIAGNOSIS — Z794 Long term (current) use of insulin: Secondary | ICD-10-CM | POA: Diagnosis not present

## 2018-11-27 DIAGNOSIS — I10 Essential (primary) hypertension: Secondary | ICD-10-CM | POA: Diagnosis not present

## 2018-11-27 DIAGNOSIS — E1122 Type 2 diabetes mellitus with diabetic chronic kidney disease: Secondary | ICD-10-CM | POA: Diagnosis not present

## 2018-11-27 DIAGNOSIS — Z89611 Acquired absence of right leg above knee: Secondary | ICD-10-CM | POA: Diagnosis not present

## 2018-11-27 DIAGNOSIS — N184 Chronic kidney disease, stage 4 (severe): Secondary | ICD-10-CM | POA: Diagnosis not present

## 2018-11-27 DIAGNOSIS — K5130 Ulcerative (chronic) rectosigmoiditis without complications: Secondary | ICD-10-CM | POA: Diagnosis not present

## 2018-11-28 DIAGNOSIS — Z794 Long term (current) use of insulin: Secondary | ICD-10-CM | POA: Diagnosis not present

## 2018-11-28 DIAGNOSIS — K5130 Ulcerative (chronic) rectosigmoiditis without complications: Secondary | ICD-10-CM | POA: Diagnosis not present

## 2018-11-28 DIAGNOSIS — R2242 Localized swelling, mass and lump, left lower limb: Secondary | ICD-10-CM | POA: Diagnosis not present

## 2018-11-28 DIAGNOSIS — R197 Diarrhea, unspecified: Secondary | ICD-10-CM | POA: Diagnosis not present

## 2018-11-28 DIAGNOSIS — E1122 Type 2 diabetes mellitus with diabetic chronic kidney disease: Secondary | ICD-10-CM | POA: Diagnosis not present

## 2018-11-28 DIAGNOSIS — N184 Chronic kidney disease, stage 4 (severe): Secondary | ICD-10-CM | POA: Diagnosis not present

## 2018-11-28 DIAGNOSIS — R159 Full incontinence of feces: Secondary | ICD-10-CM | POA: Diagnosis not present

## 2018-11-28 DIAGNOSIS — Z89511 Acquired absence of right leg below knee: Secondary | ICD-10-CM | POA: Diagnosis not present

## 2018-11-28 DIAGNOSIS — Z89611 Acquired absence of right leg above knee: Secondary | ICD-10-CM | POA: Diagnosis not present

## 2018-11-28 DIAGNOSIS — I10 Essential (primary) hypertension: Secondary | ICD-10-CM | POA: Diagnosis not present

## 2018-11-28 DIAGNOSIS — L8940 Pressure ulcer of contiguous site of back, buttock and hip, unspecified stage: Secondary | ICD-10-CM | POA: Diagnosis not present

## 2018-11-28 DIAGNOSIS — K5289 Other specified noninfective gastroenteritis and colitis: Secondary | ICD-10-CM | POA: Diagnosis not present

## 2018-11-28 DIAGNOSIS — E46 Unspecified protein-calorie malnutrition: Secondary | ICD-10-CM | POA: Diagnosis not present

## 2018-11-30 DIAGNOSIS — I10 Essential (primary) hypertension: Secondary | ICD-10-CM | POA: Diagnosis not present

## 2018-11-30 DIAGNOSIS — Z794 Long term (current) use of insulin: Secondary | ICD-10-CM | POA: Diagnosis not present

## 2018-11-30 DIAGNOSIS — N184 Chronic kidney disease, stage 4 (severe): Secondary | ICD-10-CM | POA: Diagnosis not present

## 2018-11-30 DIAGNOSIS — Z89611 Acquired absence of right leg above knee: Secondary | ICD-10-CM | POA: Diagnosis not present

## 2018-11-30 DIAGNOSIS — K5130 Ulcerative (chronic) rectosigmoiditis without complications: Secondary | ICD-10-CM | POA: Diagnosis not present

## 2018-11-30 DIAGNOSIS — E1122 Type 2 diabetes mellitus with diabetic chronic kidney disease: Secondary | ICD-10-CM | POA: Diagnosis not present

## 2018-12-04 DIAGNOSIS — K5130 Ulcerative (chronic) rectosigmoiditis without complications: Secondary | ICD-10-CM | POA: Diagnosis not present

## 2018-12-04 DIAGNOSIS — N184 Chronic kidney disease, stage 4 (severe): Secondary | ICD-10-CM | POA: Diagnosis not present

## 2018-12-04 DIAGNOSIS — I10 Essential (primary) hypertension: Secondary | ICD-10-CM | POA: Diagnosis not present

## 2018-12-04 DIAGNOSIS — Z794 Long term (current) use of insulin: Secondary | ICD-10-CM | POA: Diagnosis not present

## 2018-12-04 DIAGNOSIS — E1122 Type 2 diabetes mellitus with diabetic chronic kidney disease: Secondary | ICD-10-CM | POA: Diagnosis not present

## 2018-12-04 DIAGNOSIS — Z89611 Acquired absence of right leg above knee: Secondary | ICD-10-CM | POA: Diagnosis not present

## 2018-12-05 DIAGNOSIS — E1165 Type 2 diabetes mellitus with hyperglycemia: Secondary | ICD-10-CM | POA: Diagnosis not present

## 2018-12-05 DIAGNOSIS — J189 Pneumonia, unspecified organism: Secondary | ICD-10-CM | POA: Diagnosis not present

## 2018-12-05 DIAGNOSIS — R0902 Hypoxemia: Secondary | ICD-10-CM | POA: Diagnosis not present

## 2018-12-05 DIAGNOSIS — B9562 Methicillin resistant Staphylococcus aureus infection as the cause of diseases classified elsewhere: Secondary | ICD-10-CM | POA: Diagnosis not present

## 2018-12-05 DIAGNOSIS — J15212 Pneumonia due to Methicillin resistant Staphylococcus aureus: Secondary | ICD-10-CM | POA: Diagnosis not present

## 2018-12-05 DIAGNOSIS — R918 Other nonspecific abnormal finding of lung field: Secondary | ICD-10-CM | POA: Diagnosis not present

## 2018-12-05 DIAGNOSIS — Z7401 Bed confinement status: Secondary | ICD-10-CM | POA: Diagnosis not present

## 2018-12-05 DIAGNOSIS — I739 Peripheral vascular disease, unspecified: Secondary | ICD-10-CM | POA: Diagnosis not present

## 2018-12-05 DIAGNOSIS — K5130 Ulcerative (chronic) rectosigmoiditis without complications: Secondary | ICD-10-CM | POA: Diagnosis not present

## 2018-12-05 DIAGNOSIS — R0689 Other abnormalities of breathing: Secondary | ICD-10-CM | POA: Diagnosis not present

## 2018-12-05 DIAGNOSIS — I959 Hypotension, unspecified: Secondary | ICD-10-CM | POA: Diagnosis not present

## 2018-12-05 DIAGNOSIS — I4430 Unspecified atrioventricular block: Secondary | ICD-10-CM | POA: Diagnosis not present

## 2018-12-05 DIAGNOSIS — R55 Syncope and collapse: Secondary | ICD-10-CM | POA: Diagnosis not present

## 2018-12-05 DIAGNOSIS — I2699 Other pulmonary embolism without acute cor pulmonale: Secondary | ICD-10-CM | POA: Diagnosis not present

## 2018-12-05 DIAGNOSIS — M6281 Muscle weakness (generalized): Secondary | ICD-10-CM | POA: Diagnosis not present

## 2018-12-05 DIAGNOSIS — J9601 Acute respiratory failure with hypoxia: Secondary | ICD-10-CM | POA: Diagnosis not present

## 2018-12-05 DIAGNOSIS — E119 Type 2 diabetes mellitus without complications: Secondary | ICD-10-CM | POA: Diagnosis not present

## 2018-12-05 DIAGNOSIS — Z20828 Contact with and (suspected) exposure to other viral communicable diseases: Secondary | ICD-10-CM | POA: Diagnosis not present

## 2018-12-05 DIAGNOSIS — R7881 Bacteremia: Secondary | ICD-10-CM | POA: Diagnosis not present

## 2018-12-05 DIAGNOSIS — R404 Transient alteration of awareness: Secondary | ICD-10-CM | POA: Diagnosis not present

## 2018-12-05 DIAGNOSIS — Z209 Contact with and (suspected) exposure to unspecified communicable disease: Secondary | ICD-10-CM | POA: Diagnosis not present

## 2018-12-05 DIAGNOSIS — R2689 Other abnormalities of gait and mobility: Secondary | ICD-10-CM | POA: Diagnosis not present

## 2018-12-05 DIAGNOSIS — Z89611 Acquired absence of right leg above knee: Secondary | ICD-10-CM | POA: Diagnosis not present

## 2018-12-05 DIAGNOSIS — Z794 Long term (current) use of insulin: Secondary | ICD-10-CM | POA: Diagnosis not present

## 2018-12-05 DIAGNOSIS — Z89511 Acquired absence of right leg below knee: Secondary | ICD-10-CM | POA: Diagnosis not present

## 2018-12-05 DIAGNOSIS — J159 Unspecified bacterial pneumonia: Secondary | ICD-10-CM | POA: Diagnosis not present

## 2018-12-05 DIAGNOSIS — R791 Abnormal coagulation profile: Secondary | ICD-10-CM | POA: Diagnosis not present

## 2018-12-05 DIAGNOSIS — E1151 Type 2 diabetes mellitus with diabetic peripheral angiopathy without gangrene: Secondary | ICD-10-CM | POA: Diagnosis not present

## 2018-12-05 DIAGNOSIS — R41841 Cognitive communication deficit: Secondary | ICD-10-CM | POA: Diagnosis not present

## 2018-12-05 DIAGNOSIS — R7989 Other specified abnormal findings of blood chemistry: Secondary | ICD-10-CM | POA: Diagnosis not present

## 2018-12-05 DIAGNOSIS — E1122 Type 2 diabetes mellitus with diabetic chronic kidney disease: Secondary | ICD-10-CM | POA: Diagnosis present

## 2018-12-05 DIAGNOSIS — E1159 Type 2 diabetes mellitus with other circulatory complications: Secondary | ICD-10-CM | POA: Diagnosis not present

## 2018-12-05 DIAGNOSIS — I509 Heart failure, unspecified: Secondary | ICD-10-CM | POA: Diagnosis not present

## 2018-12-05 DIAGNOSIS — Z452 Encounter for adjustment and management of vascular access device: Secondary | ICD-10-CM | POA: Diagnosis not present

## 2018-12-05 DIAGNOSIS — I129 Hypertensive chronic kidney disease with stage 1 through stage 4 chronic kidney disease, or unspecified chronic kidney disease: Secondary | ICD-10-CM | POA: Diagnosis present

## 2018-12-05 DIAGNOSIS — N184 Chronic kidney disease, stage 4 (severe): Secondary | ICD-10-CM | POA: Diagnosis not present

## 2018-12-05 DIAGNOSIS — I10 Essential (primary) hypertension: Secondary | ICD-10-CM | POA: Diagnosis not present

## 2018-12-10 DIAGNOSIS — J9601 Acute respiratory failure with hypoxia: Secondary | ICD-10-CM | POA: Diagnosis not present

## 2018-12-10 DIAGNOSIS — I2699 Other pulmonary embolism without acute cor pulmonale: Secondary | ICD-10-CM | POA: Diagnosis not present

## 2018-12-10 DIAGNOSIS — R7881 Bacteremia: Secondary | ICD-10-CM | POA: Diagnosis not present

## 2018-12-10 DIAGNOSIS — N184 Chronic kidney disease, stage 4 (severe): Secondary | ICD-10-CM | POA: Diagnosis not present

## 2018-12-11 DIAGNOSIS — R7881 Bacteremia: Secondary | ICD-10-CM | POA: Diagnosis not present

## 2018-12-12 DIAGNOSIS — E782 Mixed hyperlipidemia: Secondary | ICD-10-CM | POA: Diagnosis not present

## 2018-12-12 DIAGNOSIS — Z6822 Body mass index (BMI) 22.0-22.9, adult: Secondary | ICD-10-CM | POA: Diagnosis not present

## 2018-12-12 DIAGNOSIS — E1151 Type 2 diabetes mellitus with diabetic peripheral angiopathy without gangrene: Secondary | ICD-10-CM | POA: Diagnosis not present

## 2018-12-12 DIAGNOSIS — E1122 Type 2 diabetes mellitus with diabetic chronic kidney disease: Secondary | ICD-10-CM | POA: Diagnosis not present

## 2018-12-12 DIAGNOSIS — J9601 Acute respiratory failure with hypoxia: Secondary | ICD-10-CM | POA: Diagnosis not present

## 2018-12-12 DIAGNOSIS — Z23 Encounter for immunization: Secondary | ICD-10-CM | POA: Diagnosis not present

## 2018-12-12 DIAGNOSIS — Z20828 Contact with and (suspected) exposure to other viral communicable diseases: Secondary | ICD-10-CM | POA: Diagnosis not present

## 2018-12-12 DIAGNOSIS — N189 Chronic kidney disease, unspecified: Secondary | ICD-10-CM | POA: Diagnosis not present

## 2018-12-12 DIAGNOSIS — R41841 Cognitive communication deficit: Secondary | ICD-10-CM | POA: Diagnosis not present

## 2018-12-12 DIAGNOSIS — R55 Syncope and collapse: Secondary | ICD-10-CM | POA: Diagnosis not present

## 2018-12-12 DIAGNOSIS — I2699 Other pulmonary embolism without acute cor pulmonale: Secondary | ICD-10-CM | POA: Diagnosis not present

## 2018-12-12 DIAGNOSIS — R404 Transient alteration of awareness: Secondary | ICD-10-CM | POA: Diagnosis not present

## 2018-12-12 DIAGNOSIS — N184 Chronic kidney disease, stage 4 (severe): Secondary | ICD-10-CM | POA: Diagnosis not present

## 2018-12-12 DIAGNOSIS — J15212 Pneumonia due to Methicillin resistant Staphylococcus aureus: Secondary | ICD-10-CM | POA: Diagnosis not present

## 2018-12-12 DIAGNOSIS — Z452 Encounter for adjustment and management of vascular access device: Secondary | ICD-10-CM | POA: Diagnosis not present

## 2018-12-12 DIAGNOSIS — J158 Pneumonia due to other specified bacteria: Secondary | ICD-10-CM | POA: Diagnosis not present

## 2018-12-12 DIAGNOSIS — R7881 Bacteremia: Secondary | ICD-10-CM | POA: Diagnosis not present

## 2018-12-12 DIAGNOSIS — J189 Pneumonia, unspecified organism: Secondary | ICD-10-CM | POA: Diagnosis not present

## 2018-12-12 DIAGNOSIS — R2689 Other abnormalities of gait and mobility: Secondary | ICD-10-CM | POA: Diagnosis not present

## 2018-12-12 DIAGNOSIS — I509 Heart failure, unspecified: Secondary | ICD-10-CM | POA: Diagnosis not present

## 2018-12-12 DIAGNOSIS — E160 Drug-induced hypoglycemia without coma: Secondary | ICD-10-CM | POA: Diagnosis not present

## 2018-12-12 DIAGNOSIS — I5021 Acute systolic (congestive) heart failure: Secondary | ICD-10-CM | POA: Diagnosis not present

## 2018-12-12 DIAGNOSIS — E119 Type 2 diabetes mellitus without complications: Secondary | ICD-10-CM | POA: Diagnosis not present

## 2018-12-12 DIAGNOSIS — J159 Unspecified bacterial pneumonia: Secondary | ICD-10-CM | POA: Diagnosis not present

## 2018-12-12 DIAGNOSIS — Z7401 Bed confinement status: Secondary | ICD-10-CM | POA: Diagnosis not present

## 2018-12-12 DIAGNOSIS — Z0000 Encounter for general adult medical examination without abnormal findings: Secondary | ICD-10-CM | POA: Diagnosis not present

## 2018-12-12 DIAGNOSIS — H42 Glaucoma in diseases classified elsewhere: Secondary | ICD-10-CM | POA: Diagnosis not present

## 2018-12-12 DIAGNOSIS — E1165 Type 2 diabetes mellitus with hyperglycemia: Secondary | ICD-10-CM | POA: Diagnosis not present

## 2018-12-12 DIAGNOSIS — E1159 Type 2 diabetes mellitus with other circulatory complications: Secondary | ICD-10-CM | POA: Diagnosis not present

## 2018-12-12 DIAGNOSIS — Z89511 Acquired absence of right leg below knee: Secondary | ICD-10-CM | POA: Diagnosis not present

## 2018-12-12 DIAGNOSIS — I10 Essential (primary) hypertension: Secondary | ICD-10-CM | POA: Diagnosis not present

## 2018-12-12 DIAGNOSIS — M6281 Muscle weakness (generalized): Secondary | ICD-10-CM | POA: Diagnosis not present

## 2018-12-12 DIAGNOSIS — Z6823 Body mass index (BMI) 23.0-23.9, adult: Secondary | ICD-10-CM | POA: Diagnosis not present

## 2018-12-12 DIAGNOSIS — I129 Hypertensive chronic kidney disease with stage 1 through stage 4 chronic kidney disease, or unspecified chronic kidney disease: Secondary | ICD-10-CM | POA: Diagnosis not present

## 2018-12-13 DIAGNOSIS — J158 Pneumonia due to other specified bacteria: Secondary | ICD-10-CM | POA: Diagnosis not present

## 2018-12-13 DIAGNOSIS — N189 Chronic kidney disease, unspecified: Secondary | ICD-10-CM | POA: Diagnosis not present

## 2018-12-13 DIAGNOSIS — I5021 Acute systolic (congestive) heart failure: Secondary | ICD-10-CM | POA: Diagnosis not present

## 2018-12-13 DIAGNOSIS — H42 Glaucoma in diseases classified elsewhere: Secondary | ICD-10-CM | POA: Diagnosis not present

## 2018-12-13 DIAGNOSIS — E119 Type 2 diabetes mellitus without complications: Secondary | ICD-10-CM | POA: Diagnosis not present

## 2018-12-21 ENCOUNTER — Other Ambulatory Visit: Payer: Self-pay | Admitting: *Deleted

## 2018-12-21 DIAGNOSIS — Z23 Encounter for immunization: Secondary | ICD-10-CM | POA: Diagnosis not present

## 2018-12-21 DIAGNOSIS — Z6823 Body mass index (BMI) 23.0-23.9, adult: Secondary | ICD-10-CM | POA: Diagnosis not present

## 2018-12-21 DIAGNOSIS — Z6822 Body mass index (BMI) 22.0-22.9, adult: Secondary | ICD-10-CM | POA: Diagnosis not present

## 2018-12-21 DIAGNOSIS — E1122 Type 2 diabetes mellitus with diabetic chronic kidney disease: Secondary | ICD-10-CM | POA: Diagnosis not present

## 2018-12-21 DIAGNOSIS — E160 Drug-induced hypoglycemia without coma: Secondary | ICD-10-CM | POA: Diagnosis not present

## 2018-12-21 DIAGNOSIS — N184 Chronic kidney disease, stage 4 (severe): Secondary | ICD-10-CM | POA: Diagnosis not present

## 2018-12-21 DIAGNOSIS — I10 Essential (primary) hypertension: Secondary | ICD-10-CM | POA: Diagnosis not present

## 2018-12-21 DIAGNOSIS — E782 Mixed hyperlipidemia: Secondary | ICD-10-CM | POA: Diagnosis not present

## 2018-12-21 DIAGNOSIS — Z0000 Encounter for general adult medical examination without abnormal findings: Secondary | ICD-10-CM | POA: Diagnosis not present

## 2018-12-21 DIAGNOSIS — Z89511 Acquired absence of right leg below knee: Secondary | ICD-10-CM | POA: Diagnosis not present

## 2018-12-21 NOTE — Patient Outreach (Signed)
Member assessed for potential [facility] Care Management needs as a benefit of  [name].  Member is currently receiving rehab therapy at [facility].   Member discussed in weekly telephonic IDT meeting with facility staff, [facility] UM team, and writer.  Facility reports [name] will dc home on 12/28/18 with wife.   Writer will outreach for potential [facility] Management services.   [name], MSN-Ed, RN,BSN [place] Acute Care Coordinator [phone] [facility]) [phone]  (Toll free office)

## 2018-12-26 ENCOUNTER — Other Ambulatory Visit: Payer: Self-pay | Admitting: *Deleted

## 2018-12-26 DIAGNOSIS — E1159 Type 2 diabetes mellitus with other circulatory complications: Secondary | ICD-10-CM | POA: Diagnosis not present

## 2018-12-26 DIAGNOSIS — J15212 Pneumonia due to Methicillin resistant Staphylococcus aureus: Secondary | ICD-10-CM | POA: Diagnosis not present

## 2018-12-26 DIAGNOSIS — I129 Hypertensive chronic kidney disease with stage 1 through stage 4 chronic kidney disease, or unspecified chronic kidney disease: Secondary | ICD-10-CM | POA: Diagnosis not present

## 2018-12-26 DIAGNOSIS — R41841 Cognitive communication deficit: Secondary | ICD-10-CM | POA: Diagnosis not present

## 2018-12-26 DIAGNOSIS — M6281 Muscle weakness (generalized): Secondary | ICD-10-CM | POA: Diagnosis not present

## 2018-12-26 DIAGNOSIS — R2689 Other abnormalities of gait and mobility: Secondary | ICD-10-CM | POA: Diagnosis not present

## 2018-12-26 DIAGNOSIS — I509 Heart failure, unspecified: Secondary | ICD-10-CM | POA: Diagnosis not present

## 2018-12-26 DIAGNOSIS — I2699 Other pulmonary embolism without acute cor pulmonale: Secondary | ICD-10-CM | POA: Diagnosis not present

## 2018-12-26 NOTE — Patient Outreach (Addendum)
Member assessed for potential [facility] Care Management needs as a benefit of  [place] [employer].  Member is currently receiving rehab therapy at [facility].  Telephone call made to Mr. Mannis mobile number at [phone] to discuss [place] Management services. Unfortunately there was no answer and there was no voicemail capability.  Per facility, Mr. Brainerd is slated for discharge on Thursday 12/28/18 to home.  Will plan to make referral upon SNF discharge. Notification sent to facility dc planner to make aware of writer's attempt to reach Mr. Markuson. Also inquired about which home health agency will be arranged.  Mr. Winski will return home with wife. He has a medical history of DM, CKD stage IV, HTN.  [facility] indicates Mr. Imran will have [place] at SNF dc.  [name], MSN-Ed, RN,BSN [place] Acute Care Coordinator [phone] [facility]) [phone]  (Toll free office)

## 2018-12-28 ENCOUNTER — Other Ambulatory Visit: Payer: Self-pay | Admitting: *Deleted

## 2018-12-28 DIAGNOSIS — I10 Essential (primary) hypertension: Secondary | ICD-10-CM

## 2018-12-28 NOTE — Patient Outreach (Signed)
Member assessed for potential [facility] Care Management needs as a benefit of  [place] Medicare.  Member discussed in weekly telephonic IDT meeting with facility staff, [facility] UM team, and writer.  Facility reports that Mr. Yarbro discharged to home today with [facility] for home health. He lives with wife.   After meeting today, writer attempted to outreach to both numbers on file. Home number [phone] fast busy. Mobile phone number [phone] there was no answer. Mobile phone line just rang and rang.   Will make referral for [place] Management services. Mr. Vondrak is advanced in age with multiple co-morbidites such as DM, CKD, stage IV, HTN.  [name], MSN-Ed, RN,BSN [place] Acute Care Coordinator [phone] [facility]) [phone]  (Toll free office)

## 2018-12-29 ENCOUNTER — Other Ambulatory Visit: Payer: Self-pay

## 2018-12-29 NOTE — Patient Outreach (Signed)
[facility]) Care Management  12/29/2018  PRANEEL HAISLEY 10/02/1923 [id number]   Referral Date: 12/29/2018 Referral Source: [facility]  Referral Reason: Patient discharge from [facility] 12/28/2018   Outreach Attempt: No answer.  HIPAA compliant voice message left.     Plan: RN CM will attempt patient again within 4 business days and send letter.     Jone Baseman, RN, MSN [facility] Care Management Care Management Coordinator Direct Line [phone] Toll Free: [phone]  Fax: [phone]

## 2019-01-02 DIAGNOSIS — J15212 Pneumonia due to Methicillin resistant Staphylococcus aureus: Secondary | ICD-10-CM | POA: Diagnosis not present

## 2019-01-02 DIAGNOSIS — I509 Heart failure, unspecified: Secondary | ICD-10-CM | POA: Diagnosis not present

## 2019-01-02 DIAGNOSIS — Z794 Long term (current) use of insulin: Secondary | ICD-10-CM | POA: Diagnosis not present

## 2019-01-02 DIAGNOSIS — E119 Type 2 diabetes mellitus without complications: Secondary | ICD-10-CM | POA: Diagnosis not present

## 2019-01-03 ENCOUNTER — Other Ambulatory Visit: Payer: Self-pay

## 2019-01-03 DIAGNOSIS — Z89511 Acquired absence of right leg below knee: Secondary | ICD-10-CM | POA: Diagnosis not present

## 2019-01-03 DIAGNOSIS — Z6821 Body mass index (BMI) 21.0-21.9, adult: Secondary | ICD-10-CM | POA: Diagnosis not present

## 2019-01-03 DIAGNOSIS — E782 Mixed hyperlipidemia: Secondary | ICD-10-CM | POA: Diagnosis not present

## 2019-01-03 DIAGNOSIS — E11319 Type 2 diabetes mellitus with unspecified diabetic retinopathy without macular edema: Secondary | ICD-10-CM | POA: Diagnosis not present

## 2019-01-03 DIAGNOSIS — E160 Drug-induced hypoglycemia without coma: Secondary | ICD-10-CM | POA: Diagnosis not present

## 2019-01-03 DIAGNOSIS — N184 Chronic kidney disease, stage 4 (severe): Secondary | ICD-10-CM | POA: Diagnosis not present

## 2019-01-03 DIAGNOSIS — E1122 Type 2 diabetes mellitus with diabetic chronic kidney disease: Secondary | ICD-10-CM | POA: Diagnosis not present

## 2019-01-03 DIAGNOSIS — I10 Essential (primary) hypertension: Secondary | ICD-10-CM | POA: Diagnosis not present

## 2019-01-03 DIAGNOSIS — Z23 Encounter for immunization: Secondary | ICD-10-CM | POA: Diagnosis not present

## 2019-01-03 NOTE — Patient Outreach (Signed)
[facility]) Care Management  01/03/2019  ALGER KERSTEIN 07-04-1923 [id number]   Referral Date: 12/29/2018 Referral Source: [facility]  Referral Reason: Patient discharge from [facility] 12/28/2018   Outreach Attempt: spoke with patient. He is able to verify HIPAA.  He state that he is doing well since being home.  He states that home health to start next week.  Patient to see PCP this afternoon for follow up visit. Patient declined medication reconciliation.    Discussed THN services and how we could support patient.  He declined services and declined follow up call for this CM but did agree to receive letter and brochure for future reference.      RN CM will send letter and close case.    Jone Baseman, RN, MSN [place] Management Care Management Coordinator Direct Line [phone] Cell [phone] Toll Free: [phone]  Fax: [phone]

## 2019-01-04 ENCOUNTER — Ambulatory Visit: Payer: Self-pay

## 2019-01-05 DIAGNOSIS — Z794 Long term (current) use of insulin: Secondary | ICD-10-CM | POA: Diagnosis not present

## 2019-01-05 DIAGNOSIS — E119 Type 2 diabetes mellitus without complications: Secondary | ICD-10-CM | POA: Diagnosis not present

## 2019-01-05 DIAGNOSIS — J15212 Pneumonia due to Methicillin resistant Staphylococcus aureus: Secondary | ICD-10-CM | POA: Diagnosis not present

## 2019-01-05 DIAGNOSIS — I509 Heart failure, unspecified: Secondary | ICD-10-CM | POA: Diagnosis not present

## 2019-01-08 DIAGNOSIS — I509 Heart failure, unspecified: Secondary | ICD-10-CM | POA: Diagnosis not present

## 2019-01-08 DIAGNOSIS — J15212 Pneumonia due to Methicillin resistant Staphylococcus aureus: Secondary | ICD-10-CM | POA: Diagnosis not present

## 2019-01-08 DIAGNOSIS — E119 Type 2 diabetes mellitus without complications: Secondary | ICD-10-CM | POA: Diagnosis not present

## 2019-01-08 DIAGNOSIS — Z794 Long term (current) use of insulin: Secondary | ICD-10-CM | POA: Diagnosis not present

## 2019-01-09 DIAGNOSIS — E119 Type 2 diabetes mellitus without complications: Secondary | ICD-10-CM | POA: Diagnosis not present

## 2019-01-09 DIAGNOSIS — J15212 Pneumonia due to Methicillin resistant Staphylococcus aureus: Secondary | ICD-10-CM | POA: Diagnosis not present

## 2019-01-09 DIAGNOSIS — Z794 Long term (current) use of insulin: Secondary | ICD-10-CM | POA: Diagnosis not present

## 2019-01-09 DIAGNOSIS — I509 Heart failure, unspecified: Secondary | ICD-10-CM | POA: Diagnosis not present

## 2019-01-10 DIAGNOSIS — J15212 Pneumonia due to Methicillin resistant Staphylococcus aureus: Secondary | ICD-10-CM | POA: Diagnosis not present

## 2019-01-10 DIAGNOSIS — I509 Heart failure, unspecified: Secondary | ICD-10-CM | POA: Diagnosis not present

## 2019-01-10 DIAGNOSIS — E119 Type 2 diabetes mellitus without complications: Secondary | ICD-10-CM | POA: Diagnosis not present

## 2019-01-10 DIAGNOSIS — Z794 Long term (current) use of insulin: Secondary | ICD-10-CM | POA: Diagnosis not present

## 2019-01-11 DIAGNOSIS — J15212 Pneumonia due to Methicillin resistant Staphylococcus aureus: Secondary | ICD-10-CM | POA: Diagnosis not present

## 2019-01-11 DIAGNOSIS — E119 Type 2 diabetes mellitus without complications: Secondary | ICD-10-CM | POA: Diagnosis not present

## 2019-01-11 DIAGNOSIS — Z794 Long term (current) use of insulin: Secondary | ICD-10-CM | POA: Diagnosis not present

## 2019-01-11 DIAGNOSIS — I509 Heart failure, unspecified: Secondary | ICD-10-CM | POA: Diagnosis not present

## 2019-01-15 DIAGNOSIS — E119 Type 2 diabetes mellitus without complications: Secondary | ICD-10-CM | POA: Diagnosis not present

## 2019-01-15 DIAGNOSIS — Z794 Long term (current) use of insulin: Secondary | ICD-10-CM | POA: Diagnosis not present

## 2019-01-15 DIAGNOSIS — J15212 Pneumonia due to Methicillin resistant Staphylococcus aureus: Secondary | ICD-10-CM | POA: Diagnosis not present

## 2019-01-15 DIAGNOSIS — I509 Heart failure, unspecified: Secondary | ICD-10-CM | POA: Diagnosis not present

## 2019-01-16 DIAGNOSIS — Z794 Long term (current) use of insulin: Secondary | ICD-10-CM | POA: Diagnosis not present

## 2019-01-16 DIAGNOSIS — I509 Heart failure, unspecified: Secondary | ICD-10-CM | POA: Diagnosis not present

## 2019-01-16 DIAGNOSIS — J15212 Pneumonia due to Methicillin resistant Staphylococcus aureus: Secondary | ICD-10-CM | POA: Diagnosis not present

## 2019-01-16 DIAGNOSIS — E119 Type 2 diabetes mellitus without complications: Secondary | ICD-10-CM | POA: Diagnosis not present

## 2019-01-17 DIAGNOSIS — I509 Heart failure, unspecified: Secondary | ICD-10-CM | POA: Diagnosis not present

## 2019-01-17 DIAGNOSIS — E119 Type 2 diabetes mellitus without complications: Secondary | ICD-10-CM | POA: Diagnosis not present

## 2019-01-17 DIAGNOSIS — J15212 Pneumonia due to Methicillin resistant Staphylococcus aureus: Secondary | ICD-10-CM | POA: Diagnosis not present

## 2019-01-17 DIAGNOSIS — Z794 Long term (current) use of insulin: Secondary | ICD-10-CM | POA: Diagnosis not present

## 2019-01-22 DIAGNOSIS — E119 Type 2 diabetes mellitus without complications: Secondary | ICD-10-CM | POA: Diagnosis not present

## 2019-01-22 DIAGNOSIS — Z794 Long term (current) use of insulin: Secondary | ICD-10-CM | POA: Diagnosis not present

## 2019-01-22 DIAGNOSIS — I509 Heart failure, unspecified: Secondary | ICD-10-CM | POA: Diagnosis not present

## 2019-01-22 DIAGNOSIS — J15212 Pneumonia due to Methicillin resistant Staphylococcus aureus: Secondary | ICD-10-CM | POA: Diagnosis not present

## 2019-01-26 DIAGNOSIS — Z794 Long term (current) use of insulin: Secondary | ICD-10-CM | POA: Diagnosis not present

## 2019-01-26 DIAGNOSIS — Z23 Encounter for immunization: Secondary | ICD-10-CM | POA: Diagnosis not present

## 2019-01-26 DIAGNOSIS — E160 Drug-induced hypoglycemia without coma: Secondary | ICD-10-CM | POA: Diagnosis not present

## 2019-01-26 DIAGNOSIS — J15212 Pneumonia due to Methicillin resistant Staphylococcus aureus: Secondary | ICD-10-CM | POA: Diagnosis not present

## 2019-01-26 DIAGNOSIS — I509 Heart failure, unspecified: Secondary | ICD-10-CM | POA: Diagnosis not present

## 2019-01-26 DIAGNOSIS — Z89511 Acquired absence of right leg below knee: Secondary | ICD-10-CM | POA: Diagnosis not present

## 2019-01-26 DIAGNOSIS — I10 Essential (primary) hypertension: Secondary | ICD-10-CM | POA: Diagnosis not present

## 2019-01-26 DIAGNOSIS — E782 Mixed hyperlipidemia: Secondary | ICD-10-CM | POA: Diagnosis not present

## 2019-01-26 DIAGNOSIS — E119 Type 2 diabetes mellitus without complications: Secondary | ICD-10-CM | POA: Diagnosis not present

## 2019-01-26 DIAGNOSIS — N184 Chronic kidney disease, stage 4 (severe): Secondary | ICD-10-CM | POA: Diagnosis not present

## 2019-01-26 DIAGNOSIS — E1122 Type 2 diabetes mellitus with diabetic chronic kidney disease: Secondary | ICD-10-CM | POA: Diagnosis not present

## 2019-01-30 DIAGNOSIS — I509 Heart failure, unspecified: Secondary | ICD-10-CM | POA: Diagnosis not present

## 2019-01-30 DIAGNOSIS — E119 Type 2 diabetes mellitus without complications: Secondary | ICD-10-CM | POA: Diagnosis not present

## 2019-01-30 DIAGNOSIS — J15212 Pneumonia due to Methicillin resistant Staphylococcus aureus: Secondary | ICD-10-CM | POA: Diagnosis not present

## 2019-01-30 DIAGNOSIS — Z794 Long term (current) use of insulin: Secondary | ICD-10-CM | POA: Diagnosis not present

## 2019-02-01 DIAGNOSIS — E119 Type 2 diabetes mellitus without complications: Secondary | ICD-10-CM | POA: Diagnosis not present

## 2019-02-01 DIAGNOSIS — Z794 Long term (current) use of insulin: Secondary | ICD-10-CM | POA: Diagnosis not present

## 2019-02-01 DIAGNOSIS — I509 Heart failure, unspecified: Secondary | ICD-10-CM | POA: Diagnosis not present

## 2019-02-01 DIAGNOSIS — J15212 Pneumonia due to Methicillin resistant Staphylococcus aureus: Secondary | ICD-10-CM | POA: Diagnosis not present

## 2019-02-13 DIAGNOSIS — J15212 Pneumonia due to Methicillin resistant Staphylococcus aureus: Secondary | ICD-10-CM | POA: Diagnosis not present

## 2019-02-13 DIAGNOSIS — Z794 Long term (current) use of insulin: Secondary | ICD-10-CM | POA: Diagnosis not present

## 2019-02-13 DIAGNOSIS — I509 Heart failure, unspecified: Secondary | ICD-10-CM | POA: Diagnosis not present

## 2019-02-13 DIAGNOSIS — E119 Type 2 diabetes mellitus without complications: Secondary | ICD-10-CM | POA: Diagnosis not present

## 2019-02-16 DIAGNOSIS — I509 Heart failure, unspecified: Secondary | ICD-10-CM | POA: Diagnosis not present

## 2019-02-16 DIAGNOSIS — J15212 Pneumonia due to Methicillin resistant Staphylococcus aureus: Secondary | ICD-10-CM | POA: Diagnosis not present

## 2019-02-16 DIAGNOSIS — E119 Type 2 diabetes mellitus without complications: Secondary | ICD-10-CM | POA: Diagnosis not present

## 2019-02-16 DIAGNOSIS — Z794 Long term (current) use of insulin: Secondary | ICD-10-CM | POA: Diagnosis not present

## 2019-02-25 DIAGNOSIS — Z86711 Personal history of pulmonary embolism: Secondary | ICD-10-CM | POA: Diagnosis not present

## 2019-02-25 DIAGNOSIS — R0689 Other abnormalities of breathing: Secondary | ICD-10-CM | POA: Diagnosis not present

## 2019-02-25 DIAGNOSIS — A045 Campylobacter enteritis: Secondary | ICD-10-CM | POA: Diagnosis not present

## 2019-02-25 DIAGNOSIS — N189 Chronic kidney disease, unspecified: Secondary | ICD-10-CM | POA: Diagnosis not present

## 2019-02-25 DIAGNOSIS — N179 Acute kidney failure, unspecified: Secondary | ICD-10-CM | POA: Diagnosis not present

## 2019-02-25 DIAGNOSIS — E1140 Type 2 diabetes mellitus with diabetic neuropathy, unspecified: Secondary | ICD-10-CM | POA: Diagnosis not present

## 2019-02-25 DIAGNOSIS — K8020 Calculus of gallbladder without cholecystitis without obstruction: Secondary | ICD-10-CM | POA: Diagnosis not present

## 2019-02-25 DIAGNOSIS — I959 Hypotension, unspecified: Secondary | ICD-10-CM | POA: Diagnosis not present

## 2019-02-25 DIAGNOSIS — E46 Unspecified protein-calorie malnutrition: Secondary | ICD-10-CM | POA: Diagnosis not present

## 2019-02-25 DIAGNOSIS — T68XXXA Hypothermia, initial encounter: Secondary | ICD-10-CM | POA: Diagnosis present

## 2019-02-25 DIAGNOSIS — I509 Heart failure, unspecified: Secondary | ICD-10-CM | POA: Diagnosis not present

## 2019-02-25 DIAGNOSIS — Z20828 Contact with and (suspected) exposure to other viral communicable diseases: Secondary | ICD-10-CM | POA: Diagnosis present

## 2019-02-25 DIAGNOSIS — Z681 Body mass index (BMI) 19 or less, adult: Secondary | ICD-10-CM | POA: Diagnosis not present

## 2019-02-25 DIAGNOSIS — Z89511 Acquired absence of right leg below knee: Secondary | ICD-10-CM | POA: Diagnosis not present

## 2019-02-25 DIAGNOSIS — E440 Moderate protein-calorie malnutrition: Secondary | ICD-10-CM | POA: Diagnosis not present

## 2019-02-25 DIAGNOSIS — Z7901 Long term (current) use of anticoagulants: Secondary | ICD-10-CM | POA: Diagnosis not present

## 2019-02-25 DIAGNOSIS — I129 Hypertensive chronic kidney disease with stage 1 through stage 4 chronic kidney disease, or unspecified chronic kidney disease: Secondary | ICD-10-CM | POA: Diagnosis not present

## 2019-02-25 DIAGNOSIS — E1165 Type 2 diabetes mellitus with hyperglycemia: Secondary | ICD-10-CM | POA: Diagnosis present

## 2019-02-25 DIAGNOSIS — E860 Dehydration: Secondary | ICD-10-CM | POA: Diagnosis not present

## 2019-02-25 DIAGNOSIS — K5900 Constipation, unspecified: Secondary | ICD-10-CM | POA: Diagnosis not present

## 2019-02-25 DIAGNOSIS — R55 Syncope and collapse: Secondary | ICD-10-CM | POA: Diagnosis not present

## 2019-02-25 DIAGNOSIS — N184 Chronic kidney disease, stage 4 (severe): Secondary | ICD-10-CM | POA: Diagnosis present

## 2019-02-25 DIAGNOSIS — I499 Cardiac arrhythmia, unspecified: Secondary | ICD-10-CM | POA: Diagnosis not present

## 2019-02-25 DIAGNOSIS — M6281 Muscle weakness (generalized): Secondary | ICD-10-CM | POA: Diagnosis not present

## 2019-02-25 DIAGNOSIS — E1151 Type 2 diabetes mellitus with diabetic peripheral angiopathy without gangrene: Secondary | ICD-10-CM | POA: Diagnosis present

## 2019-02-25 DIAGNOSIS — E1122 Type 2 diabetes mellitus with diabetic chronic kidney disease: Secondary | ICD-10-CM | POA: Diagnosis not present

## 2019-02-25 DIAGNOSIS — F0390 Unspecified dementia without behavioral disturbance: Secondary | ICD-10-CM | POA: Diagnosis not present

## 2019-02-25 DIAGNOSIS — R41841 Cognitive communication deficit: Secondary | ICD-10-CM | POA: Diagnosis not present

## 2019-02-25 DIAGNOSIS — A4150 Gram-negative sepsis, unspecified: Secondary | ICD-10-CM | POA: Diagnosis not present

## 2019-02-25 DIAGNOSIS — R2689 Other abnormalities of gait and mobility: Secondary | ICD-10-CM | POA: Diagnosis not present

## 2019-02-25 DIAGNOSIS — R569 Unspecified convulsions: Secondary | ICD-10-CM | POA: Diagnosis not present

## 2019-02-25 DIAGNOSIS — A4159 Other Gram-negative sepsis: Secondary | ICD-10-CM | POA: Diagnosis not present

## 2019-02-25 DIAGNOSIS — Z66 Do not resuscitate: Secondary | ICD-10-CM | POA: Diagnosis present

## 2019-03-05 DIAGNOSIS — M6281 Muscle weakness (generalized): Secondary | ICD-10-CM | POA: Diagnosis not present

## 2019-03-05 DIAGNOSIS — I509 Heart failure, unspecified: Secondary | ICD-10-CM | POA: Diagnosis not present

## 2019-03-05 DIAGNOSIS — N189 Chronic kidney disease, unspecified: Secondary | ICD-10-CM | POA: Diagnosis not present

## 2019-03-05 DIAGNOSIS — N179 Acute kidney failure, unspecified: Secondary | ICD-10-CM | POA: Diagnosis not present

## 2019-03-05 DIAGNOSIS — E119 Type 2 diabetes mellitus without complications: Secondary | ICD-10-CM | POA: Diagnosis not present

## 2019-03-05 DIAGNOSIS — Z89511 Acquired absence of right leg below knee: Secondary | ICD-10-CM | POA: Diagnosis not present

## 2019-03-05 DIAGNOSIS — F0390 Unspecified dementia without behavioral disturbance: Secondary | ICD-10-CM | POA: Diagnosis not present

## 2019-03-05 DIAGNOSIS — R7881 Bacteremia: Secondary | ICD-10-CM | POA: Diagnosis not present

## 2019-03-05 DIAGNOSIS — R41841 Cognitive communication deficit: Secondary | ICD-10-CM | POA: Diagnosis not present

## 2019-03-05 DIAGNOSIS — I129 Hypertensive chronic kidney disease with stage 1 through stage 4 chronic kidney disease, or unspecified chronic kidney disease: Secondary | ICD-10-CM | POA: Diagnosis not present

## 2019-03-05 DIAGNOSIS — E1151 Type 2 diabetes mellitus with diabetic peripheral angiopathy without gangrene: Secondary | ICD-10-CM | POA: Diagnosis not present

## 2019-03-05 DIAGNOSIS — A4150 Gram-negative sepsis, unspecified: Secondary | ICD-10-CM | POA: Diagnosis not present

## 2019-03-05 DIAGNOSIS — E1140 Type 2 diabetes mellitus with diabetic neuropathy, unspecified: Secondary | ICD-10-CM | POA: Diagnosis not present

## 2019-03-05 DIAGNOSIS — R2689 Other abnormalities of gait and mobility: Secondary | ICD-10-CM | POA: Diagnosis not present

## 2019-03-05 DIAGNOSIS — N184 Chronic kidney disease, stage 4 (severe): Secondary | ICD-10-CM | POA: Diagnosis not present

## 2019-03-06 DIAGNOSIS — E119 Type 2 diabetes mellitus without complications: Secondary | ICD-10-CM | POA: Diagnosis not present

## 2019-03-06 DIAGNOSIS — R7881 Bacteremia: Secondary | ICD-10-CM | POA: Diagnosis not present

## 2019-03-06 DIAGNOSIS — N189 Chronic kidney disease, unspecified: Secondary | ICD-10-CM | POA: Diagnosis not present

## 2019-03-08 ENCOUNTER — Other Ambulatory Visit: Payer: Self-pay | Admitting: *Deleted

## 2019-03-08 NOTE — Patient Outreach (Signed)
Member assessed for potential [facility] Care Management needs as a benefit of  [place] Medicare.  Member is currently receiving rehab therapy at [facility].  Member discussed in weekly telephonic IDT meeting with facility staff, [facility] UM team. Spoke with [facility] UM RN after meeting.   Member is from home with spouse. Disposition plan likely to return home upon discharge.  Will continue to follow for disposition plans, progression, and for potential [facility] Care Management needs.  [name], MSN-Ed, RN,BSN [place] Acute Care Coordinator [phone] [facility]) [phone]  (Toll free office)

## 2019-03-15 ENCOUNTER — Other Ambulatory Visit: Payer: Self-pay | Admitting: *Deleted

## 2019-03-15 NOTE — Patient Outreach (Signed)
Member assessed for potential [facility] Care Management needs as a benefit of  [place] Medicare.  Member is currently receiving skilled therapy at [facility].  Member discussed in weekly telephonic IDT meeting with facility staff, [facility] UM team, and writer.  Facility states member is progressing well with therapy. Likely dc soon to home with wife. Has supportive children. Will confirm best contact person for outreach to discuss disposition plans and potential [facility] Care Management needs. It appears facility has been speaking with the son.   Will follow up.  [name], MSN-Ed, RN,BSN [place] Acute Care Coordinator [phone] [facility]) [phone]  (Toll free office)

## 2019-03-20 ENCOUNTER — Other Ambulatory Visit: Payer: Self-pay | Admitting: *Deleted

## 2019-03-20 NOTE — Patient Outreach (Signed)
Member assessed for potential [facility] Care Management needs as a benefit of [place] Medicare.  Telephone call received from [facility] son Riley Griffith. Patient identifiers confirmed.  Explained [facility] Care Management and disposition plans. Riley Griffith reports [name] will return home with wife. However, Riley Griffith states Riley Griffith (wife) has memory issues. States it is best that [facility] RNCM contact him for post SNF discharge follow up. Riley Griffith confirms his cell number is [phone].  Explained that [place] Management will not interfere with services provided by home health. Also explained that [place] Management services will be telephonic due to the current pandemic. Riley Griffith expressed understanding.  Riley Griffith states he has caregiver assistance arranged for Riley Griffith upon SNF discharge. Riley Griffith reports that he has been thinking about ALF placement for both of his parents pretty soon. Discussed that [place] Management also has social work services to assist with long term care planning needs as well.   Discharge date is slated for this Saturday 03/24/19. Discussed [facility] Care Management RNCM referral. Will send [place] Management contact information via secure email.   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  Will plan to make [place] Management for [facility] RNCM.  Made facility dc planner and [facility] UM RN aware that writer spoke with son.   Riley Rolling, MSN-Ed, RN,BSN [place] Acute Care Coordinator [phone] [facility]) [phone]  (Toll free office)

## 2019-03-20 NOTE — Patient Outreach (Signed)
Member assessed for potential [facility] Care Management needs as a benefit of [place] Medicare.  Mr. Mathieson is currently receiving skilled therapy at [facility].  THN UM RN indicated member will discharge from SNF to home with family this week.   Telephone call attempts made to numbers on file. No answer on any. Left HIPAA compliant voicemail messages requesting call back.   Will plan to make [place] Management upon SNF discharge if unable to reach prior to dc.    [name], MSN-Ed, RN,BSN [place] Acute Care Coordinator [phone] [facility]) [phone]  (Toll free office)

## 2019-03-26 ENCOUNTER — Other Ambulatory Visit: Payer: Self-pay | Admitting: *Deleted

## 2019-03-26 DIAGNOSIS — N400 Enlarged prostate without lower urinary tract symptoms: Secondary | ICD-10-CM | POA: Diagnosis not present

## 2019-03-26 DIAGNOSIS — I739 Peripheral vascular disease, unspecified: Secondary | ICD-10-CM

## 2019-03-26 DIAGNOSIS — N179 Acute kidney failure, unspecified: Secondary | ICD-10-CM | POA: Diagnosis not present

## 2019-03-26 DIAGNOSIS — I110 Hypertensive heart disease with heart failure: Secondary | ICD-10-CM | POA: Diagnosis not present

## 2019-03-26 DIAGNOSIS — E1151 Type 2 diabetes mellitus with diabetic peripheral angiopathy without gangrene: Secondary | ICD-10-CM | POA: Diagnosis not present

## 2019-03-26 DIAGNOSIS — E1122 Type 2 diabetes mellitus with diabetic chronic kidney disease: Secondary | ICD-10-CM | POA: Diagnosis not present

## 2019-03-26 DIAGNOSIS — H409 Unspecified glaucoma: Secondary | ICD-10-CM | POA: Diagnosis not present

## 2019-03-26 DIAGNOSIS — E785 Hyperlipidemia, unspecified: Secondary | ICD-10-CM | POA: Diagnosis not present

## 2019-03-26 DIAGNOSIS — Z89511 Acquired absence of right leg below knee: Secondary | ICD-10-CM | POA: Diagnosis not present

## 2019-03-26 DIAGNOSIS — D509 Iron deficiency anemia, unspecified: Secondary | ICD-10-CM | POA: Diagnosis not present

## 2019-03-26 DIAGNOSIS — Z7901 Long term (current) use of anticoagulants: Secondary | ICD-10-CM | POA: Diagnosis not present

## 2019-03-26 DIAGNOSIS — E1142 Type 2 diabetes mellitus with diabetic polyneuropathy: Secondary | ICD-10-CM | POA: Diagnosis not present

## 2019-03-26 DIAGNOSIS — I509 Heart failure, unspecified: Secondary | ICD-10-CM | POA: Diagnosis not present

## 2019-03-26 DIAGNOSIS — N189 Chronic kidney disease, unspecified: Secondary | ICD-10-CM | POA: Diagnosis not present

## 2019-03-26 NOTE — Patient Outreach (Signed)
Member assessed for potential [facility] Care Management needs as a benefit of [place] Medicare.  Confirmed in Patient Riley Griffith that Mr. Spadafore discharged from [facility] on 03/24/19. Dc planner previously reported Mr. Stankus will have [facility].   Eugune Sine (son) [phone] should be contacted if unable to reach. Please see writer's notes from 03/20/19.   Will make [place] Management RNCM referral made.    [name], MSN-Ed, RN,BSN [place] Acute Care Coordinator [phone] [facility]) [phone]  (Toll free office)

## 2019-03-27 ENCOUNTER — Other Ambulatory Visit: Payer: Self-pay | Admitting: *Deleted

## 2019-03-27 ENCOUNTER — Encounter: Payer: Self-pay | Admitting: *Deleted

## 2019-03-27 DIAGNOSIS — E119 Type 2 diabetes mellitus without complications: Secondary | ICD-10-CM

## 2019-03-27 NOTE — Patient Outreach (Signed)
Referral received from post acute care coordinator, pt discharged [facility] SNF on 03/24/19 after rehab for deconditioning, pt with diagnoses DM, CHF, s/p BKA (9 years ago), outreach call to pt for screening/ transition of care week 1, spoke with pt, HIPAA verified, pt states he was in SNF due to having multiple health issues- stomach issues/ blockage to begin with then progressed to dizziness and pneumonia, then dehydration.  Pt reports he lives with his spouse and his adult children assist as well as hired help.  Pt states he has been taken off all medications for diabetes due to he had some hypoglycemic episodes over the past few weeks and pt feels CBG has been elevated due recently due to this and plans to discuss at primary care provider visit next week.  Pt reports he has lost weight over the past 3 months due to being sick and is drinking 3 cans glucerna daily and eating 3 meals daily most of the time, reports appetite " is still not quite right but getting better"  Pt states his weight was 170 pounds and now he weighs 127 pounds.  Pt has scale but does not weigh but states he used to and can start again.  RN CM reviewed medications with pt and pt states he is not sure about all the medications as his daughter " looks after this"  RN CM ask if ok to speak with daughter and pt states " no, there's no need to bother her"  RN CM called patient's son Leith Szafranski (permission given previously to post acute care coordinator) and Fritz Pickerel reports his parents are going to need more assistance in the home and they have long term care insurance and will need help navigating this process. RN CM placed order for [facility] social worker for assistance. Fritz Pickerel states he will see pt this week and speak with him about giving permission to speak with pt daughter Granville Lewis at [phone] as she is part of the process with overseeing Mr. Vanaman and will call RN CM back.  RN CM faxed today's note and barrier letter to primary  MD.  Outpatient Encounter Medications as of 03/27/2019  Medication Sig  . acetaminophen (TYLENOL) 325 MG tablet Take 2 tablets (650 mg total) by mouth every 6 (six) hours as needed for mild pain, fever or headache (or Fever >/= 101).  . bimatoprost (LUMIGAN) 0.01 % SOLN Place 1 drop into the left eye at bedtime.  . brimonidine (ALPHAGAN P) 0.1 % SOLN Place 1 drop into the left eye 2 (two) times daily.  . calcitRIOL (ROCALTROL) 0.25 MCG capsule Take 0.25 mcg by mouth daily.  . carvedilol (COREG) 3.125 MG tablet Take 1 tablet (3.125 mg total) by mouth 2 (two) times daily with a meal.  . cholecalciferol (VITAMIN D) 1000 UNITS tablet Take 1,000 Units by mouth daily.    Marland Kitchen co-enzyme Q-10 30 MG capsule Take 30 mg by mouth daily.    . ferrous sulfate 325 (65 FE) MG tablet Take 325 mg by mouth daily with breakfast.  . furosemide (LASIX) 20 MG tablet Take 20 mg by mouth daily. For swelling   . lisinopril (PRINIVIL,ZESTRIL) 10 MG tablet Take 10 mg by mouth daily.    . ondansetron (ZOFRAN ODT) 4 MG disintegrating tablet Take 1 tablet (4 mg total) by mouth every 8 (eight) hours as needed for nausea or vomiting.  . senna-docusate (SENOKOT-S) 8.6-50 MG tablet Take 2 tablets by mouth at bedtime. For Bowels/Constipation--- skip if you have  Loose stools  . Tamsulosin HCl (FLOMAX) 0.4 MG CAPS Take 0.4 mg by mouth daily after supper.    . vitamin C (ASCORBIC ACID) 500 MG tablet Take 500 mg by mouth daily.    . insulin glargine (LANTUS) 100 UNIT/ML injection Inject 10 Units into the skin at bedtime.   . sodium bicarbonate 650 MG tablet Take 650 mg by mouth 2 (two) times daily.   No facility-administered encounter medications on file as of 03/27/2019.    THN CM Care Plan Problem One     Most Recent Value  Care Plan Problem One  Knowledge deficit related to diabetes, HF, malnutrition  Role Documenting the Problem One  Care Management Coordinator  Care Plan for Problem One  Active  THN Long Term Goal   Pt will  demonstrate improved self care related to DM, CHF, nutrition within 60 days  THN Long Term Goal Start Date  03/27/19  Interventions for Problem One Long Term Goal  RN CM established and reviewed plan of care with pt, mailed successful outreach letter to pt home including pamphlet, 24 hour nurse line magnet, consent form, THN calendar so pt can record weight and CBG and organize appointments.  THN CM Short Term Goal #1   Pt will verbalize plate method and try this method within 30 days  THN CM Short Term Goal #1 Start Date  03/27/19  Interventions for Short Term Goal #1  RN CM reviewed plate method and importance of choosing nutritious foods  THN CM Short Term Goal #2   Pt will weigh daily and record within 30 days  THN CM Short Term Goal #2 Start Date  03/27/19  Interventions for Short Term Goal #2  RN CM ask pt to begin weighing and recording daily, reviewed importance of calling MD for 3 pound weight gain overnight and 5 pound weight gain within one week  THN CM Short Term Goal #3  Pt will slowly gain weight over the next 30 days  THN CM Short Term Goal #3 Start Date  03/27/19  Interventions for Short Tern Goal #3  RN CM reviewed importance of choosing nutritious, high calorie food and importance of eating 3 meals daily plus snacks and continuing to drink glucerna       PLAN Continue weekly transition of care Assess weight and CBG Collaborate with [facility] social worker as needed  Jacqlyn Larsen [facility], [place] Coordinator [phone]

## 2019-03-29 ENCOUNTER — Other Ambulatory Visit: Payer: Self-pay

## 2019-03-29 DIAGNOSIS — N189 Chronic kidney disease, unspecified: Secondary | ICD-10-CM | POA: Diagnosis not present

## 2019-03-29 DIAGNOSIS — N179 Acute kidney failure, unspecified: Secondary | ICD-10-CM | POA: Diagnosis not present

## 2019-03-29 DIAGNOSIS — E1122 Type 2 diabetes mellitus with diabetic chronic kidney disease: Secondary | ICD-10-CM | POA: Diagnosis not present

## 2019-03-29 DIAGNOSIS — E1142 Type 2 diabetes mellitus with diabetic polyneuropathy: Secondary | ICD-10-CM | POA: Diagnosis not present

## 2019-03-29 DIAGNOSIS — I739 Peripheral vascular disease, unspecified: Secondary | ICD-10-CM | POA: Diagnosis not present

## 2019-03-29 DIAGNOSIS — E1151 Type 2 diabetes mellitus with diabetic peripheral angiopathy without gangrene: Secondary | ICD-10-CM | POA: Diagnosis not present

## 2019-03-29 NOTE — Patient Outreach (Signed)
[facility]) Care Management  03/29/2019  Riley Griffith 09-Jul-1923 [id number]   Social Work referral received from [facility], Riley Griffith.  Per referral, patient and spouse are seeking in home assistance through long term care insurance provider and need help navigating this process. Spoke with patient briefly today and he requested that I call his son, Riley Griffith.  Successful outreach to son.  Son reports that patient and patient's daughter contacted insurance company about in home assistance but "did not get very far."  Son stated that he would have to speak with patient's daughter to understand exactly what was communicated by the insurance company.  He requested that a call be scheduled with him and patient's daughter to discuss further.  Son will call me back with day/time that they are available next week.    Riley Griffith, BSW Social Worker [phone]

## 2019-03-30 DIAGNOSIS — I739 Peripheral vascular disease, unspecified: Secondary | ICD-10-CM | POA: Diagnosis not present

## 2019-03-30 DIAGNOSIS — N189 Chronic kidney disease, unspecified: Secondary | ICD-10-CM | POA: Diagnosis not present

## 2019-03-30 DIAGNOSIS — E1122 Type 2 diabetes mellitus with diabetic chronic kidney disease: Secondary | ICD-10-CM | POA: Diagnosis not present

## 2019-03-30 DIAGNOSIS — E1151 Type 2 diabetes mellitus with diabetic peripheral angiopathy without gangrene: Secondary | ICD-10-CM | POA: Diagnosis not present

## 2019-03-30 DIAGNOSIS — N179 Acute kidney failure, unspecified: Secondary | ICD-10-CM | POA: Diagnosis not present

## 2019-03-30 DIAGNOSIS — E1142 Type 2 diabetes mellitus with diabetic polyneuropathy: Secondary | ICD-10-CM | POA: Diagnosis not present

## 2019-04-02 DIAGNOSIS — E1142 Type 2 diabetes mellitus with diabetic polyneuropathy: Secondary | ICD-10-CM | POA: Diagnosis not present

## 2019-04-02 DIAGNOSIS — N179 Acute kidney failure, unspecified: Secondary | ICD-10-CM | POA: Diagnosis not present

## 2019-04-02 DIAGNOSIS — E1122 Type 2 diabetes mellitus with diabetic chronic kidney disease: Secondary | ICD-10-CM | POA: Diagnosis not present

## 2019-04-02 DIAGNOSIS — E1151 Type 2 diabetes mellitus with diabetic peripheral angiopathy without gangrene: Secondary | ICD-10-CM | POA: Diagnosis not present

## 2019-04-02 DIAGNOSIS — I739 Peripheral vascular disease, unspecified: Secondary | ICD-10-CM | POA: Diagnosis not present

## 2019-04-02 DIAGNOSIS — N189 Chronic kidney disease, unspecified: Secondary | ICD-10-CM | POA: Diagnosis not present

## 2019-04-03 ENCOUNTER — Other Ambulatory Visit: Payer: Self-pay | Admitting: *Deleted

## 2019-04-03 DIAGNOSIS — E1142 Type 2 diabetes mellitus with diabetic polyneuropathy: Secondary | ICD-10-CM | POA: Diagnosis not present

## 2019-04-03 DIAGNOSIS — N179 Acute kidney failure, unspecified: Secondary | ICD-10-CM | POA: Diagnosis not present

## 2019-04-03 DIAGNOSIS — N189 Chronic kidney disease, unspecified: Secondary | ICD-10-CM | POA: Diagnosis not present

## 2019-04-03 DIAGNOSIS — E1151 Type 2 diabetes mellitus with diabetic peripheral angiopathy without gangrene: Secondary | ICD-10-CM | POA: Diagnosis not present

## 2019-04-03 DIAGNOSIS — I739 Peripheral vascular disease, unspecified: Secondary | ICD-10-CM | POA: Diagnosis not present

## 2019-04-03 DIAGNOSIS — E1122 Type 2 diabetes mellitus with diabetic chronic kidney disease: Secondary | ICD-10-CM | POA: Diagnosis not present

## 2019-04-03 NOTE — Patient Outreach (Addendum)
Outreach call to patient's son Kutler Vanvranken for transition of care week 2, spoke with Fritz Pickerel who reports he checks in on patient and there is now hired help 8 hours daily with one of the caregivers being a retired [employer] scientist.  Pt is back on lantus insulin 3 or 4 units daily with maximum dosage of 10 units, Fritz Pickerel reports " this is what my dad has done for years and he bases it on what his blood sugar readings are and does well with this"  Fritz Pickerel reports readings are now 180-200's range.  Fritz Pickerel states they constantly work on getting pt to drink enough water, pt does like tea, coffee, glucerna also and seems well hydrated per Fritz Pickerel.  THN CM Care Plan Problem One     Most Recent Value  Care Plan Problem One  Knowledge deficit related to diabetes, HF, malnutrition  Role Documenting the Problem One  Care Management Coordinator  Care Plan for Problem One  Active  THN Long Term Goal   Pt will demonstrate improved self care related to DM, CHF, nutrition within 60 days  THN Long Term Goal Start Date  03/27/19  Interventions for Problem One Long Term Goal  RN CM reinforced plan of care with patient's son Fritz Pickerel,  pts son and daughter to have call in with [facility] BSW this week on 04/05/19 regarding long term care optins, pt is now back on insulin and continues checking CBG  THN CM Short Term Goal #1   Pt will verbalize plate method and try this method within 30 days  THN CM Short Term Goal #1 Start Date  03/27/19  Interventions for Short Term Goal #1  RN CM reinforced plate method with son as he does assist with and oversee pt care, reviewed nutritious food choces  THN CM Short Term Goal #2   Pt will weigh daily and record within 30 days  THN CM Short Term Goal #2 Start Date  03/27/19  Interventions for Short Term Goal #2  RN CM reinforced importance of daily weights, patient's son feels pt is not weighing daily but will encourage it  THN CM Short Term Goal #3  Pt will slowly gain weight over the next 30 days  THN CM  Short Term Goal #3 Start Date  03/27/19  Interventions for Short Tern Goal #3  Weight is 148 pounds per patient's son (not 127 per pt report),  RN CM reviewed importance of 3 meals daily with adequatae calories plus continuing glucerna      PLAN Continue weekly transition of care calls Collaborate with [facility] BSW as needed  Jacqlyn Larsen [facility], [place] Coordinator [phone]

## 2019-04-04 DIAGNOSIS — H35109 Retinopathy of prematurity, unspecified, unspecified eye: Secondary | ICD-10-CM | POA: Diagnosis not present

## 2019-04-04 DIAGNOSIS — E440 Moderate protein-calorie malnutrition: Secondary | ICD-10-CM | POA: Diagnosis not present

## 2019-04-04 DIAGNOSIS — R197 Diarrhea, unspecified: Secondary | ICD-10-CM | POA: Diagnosis not present

## 2019-04-04 DIAGNOSIS — L8940 Pressure ulcer of contiguous site of back, buttock and hip, unspecified stage: Secondary | ICD-10-CM | POA: Diagnosis not present

## 2019-04-04 DIAGNOSIS — L89322 Pressure ulcer of left buttock, stage 2: Secondary | ICD-10-CM | POA: Diagnosis not present

## 2019-04-04 DIAGNOSIS — Z4800 Encounter for change or removal of nonsurgical wound dressing: Secondary | ICD-10-CM | POA: Diagnosis not present

## 2019-04-04 DIAGNOSIS — E1122 Type 2 diabetes mellitus with diabetic chronic kidney disease: Secondary | ICD-10-CM | POA: Diagnosis not present

## 2019-04-04 DIAGNOSIS — Z6821 Body mass index (BMI) 21.0-21.9, adult: Secondary | ICD-10-CM | POA: Diagnosis not present

## 2019-04-04 DIAGNOSIS — H3530 Unspecified macular degeneration: Secondary | ICD-10-CM | POA: Diagnosis not present

## 2019-04-04 DIAGNOSIS — Z794 Long term (current) use of insulin: Secondary | ICD-10-CM | POA: Diagnosis not present

## 2019-04-04 DIAGNOSIS — I739 Peripheral vascular disease, unspecified: Secondary | ICD-10-CM | POA: Diagnosis not present

## 2019-04-04 DIAGNOSIS — N179 Acute kidney failure, unspecified: Secondary | ICD-10-CM | POA: Diagnosis not present

## 2019-04-04 DIAGNOSIS — K5289 Other specified noninfective gastroenteritis and colitis: Secondary | ICD-10-CM | POA: Diagnosis not present

## 2019-04-04 DIAGNOSIS — E46 Unspecified protein-calorie malnutrition: Secondary | ICD-10-CM | POA: Diagnosis not present

## 2019-04-04 DIAGNOSIS — R159 Full incontinence of feces: Secondary | ICD-10-CM | POA: Diagnosis not present

## 2019-04-04 DIAGNOSIS — N189 Chronic kidney disease, unspecified: Secondary | ICD-10-CM | POA: Diagnosis not present

## 2019-04-04 DIAGNOSIS — N184 Chronic kidney disease, stage 4 (severe): Secondary | ICD-10-CM | POA: Diagnosis not present

## 2019-04-04 DIAGNOSIS — Z86711 Personal history of pulmonary embolism: Secondary | ICD-10-CM | POA: Diagnosis not present

## 2019-04-04 DIAGNOSIS — E1142 Type 2 diabetes mellitus with diabetic polyneuropathy: Secondary | ICD-10-CM | POA: Diagnosis not present

## 2019-04-04 DIAGNOSIS — E11319 Type 2 diabetes mellitus with unspecified diabetic retinopathy without macular edema: Secondary | ICD-10-CM | POA: Diagnosis not present

## 2019-04-04 DIAGNOSIS — E1151 Type 2 diabetes mellitus with diabetic peripheral angiopathy without gangrene: Secondary | ICD-10-CM | POA: Diagnosis not present

## 2019-04-05 ENCOUNTER — Other Ambulatory Visit: Payer: Self-pay

## 2019-04-05 DIAGNOSIS — E1151 Type 2 diabetes mellitus with diabetic peripheral angiopathy without gangrene: Secondary | ICD-10-CM | POA: Diagnosis not present

## 2019-04-05 DIAGNOSIS — E1122 Type 2 diabetes mellitus with diabetic chronic kidney disease: Secondary | ICD-10-CM | POA: Diagnosis not present

## 2019-04-05 DIAGNOSIS — N179 Acute kidney failure, unspecified: Secondary | ICD-10-CM | POA: Diagnosis not present

## 2019-04-05 DIAGNOSIS — N189 Chronic kidney disease, unspecified: Secondary | ICD-10-CM | POA: Diagnosis not present

## 2019-04-05 DIAGNOSIS — E1142 Type 2 diabetes mellitus with diabetic polyneuropathy: Secondary | ICD-10-CM | POA: Diagnosis not present

## 2019-04-05 DIAGNOSIS — I739 Peripheral vascular disease, unspecified: Secondary | ICD-10-CM | POA: Diagnosis not present

## 2019-04-05 NOTE — Patient Outreach (Signed)
[name]) Care Management  04/05/2019  Riley Griffith Nov 13, 1923 [id number]   Call with patient's son and daughter today regarding in-home aide services.  Daughter reports that patient is eligible for $200 per day of aide services through long term care insurance.  Per daughter, she was told that this coverage "kicks in after 90 days".  Although patient is currently receiving aide services, these hours cannot be counted toward this 90 days because the aide must be certified.  Patient is currently receiving assistance from two individuals but neither are certified. Daughter stated that they need to locate an aide that is certified.  Discussed typical cost and assessment process for services through aide agency.  Daughter requested that list of agencies be emailed to her.  Home Care Directory sent via secure email.  Encouraged son and daughter to inquire with insurance company about what type of documentation may be needed, if any, to have aide services counted toward the 90 day requirement once certified aide is hired. Will follow up next week.  [name], BSW Social Worker [phone]

## 2019-04-09 DIAGNOSIS — E1142 Type 2 diabetes mellitus with diabetic polyneuropathy: Secondary | ICD-10-CM | POA: Diagnosis not present

## 2019-04-09 DIAGNOSIS — I739 Peripheral vascular disease, unspecified: Secondary | ICD-10-CM | POA: Diagnosis not present

## 2019-04-09 DIAGNOSIS — E1122 Type 2 diabetes mellitus with diabetic chronic kidney disease: Secondary | ICD-10-CM | POA: Diagnosis not present

## 2019-04-09 DIAGNOSIS — N179 Acute kidney failure, unspecified: Secondary | ICD-10-CM | POA: Diagnosis not present

## 2019-04-09 DIAGNOSIS — E1151 Type 2 diabetes mellitus with diabetic peripheral angiopathy without gangrene: Secondary | ICD-10-CM | POA: Diagnosis not present

## 2019-04-09 DIAGNOSIS — N189 Chronic kidney disease, unspecified: Secondary | ICD-10-CM | POA: Diagnosis not present

## 2019-04-10 ENCOUNTER — Encounter: Payer: Self-pay | Admitting: *Deleted

## 2019-04-10 ENCOUNTER — Other Ambulatory Visit: Payer: Self-pay | Admitting: *Deleted

## 2019-04-10 DIAGNOSIS — E1122 Type 2 diabetes mellitus with diabetic chronic kidney disease: Secondary | ICD-10-CM | POA: Diagnosis not present

## 2019-04-10 DIAGNOSIS — I739 Peripheral vascular disease, unspecified: Secondary | ICD-10-CM | POA: Diagnosis not present

## 2019-04-10 DIAGNOSIS — N189 Chronic kidney disease, unspecified: Secondary | ICD-10-CM | POA: Diagnosis not present

## 2019-04-10 DIAGNOSIS — N179 Acute kidney failure, unspecified: Secondary | ICD-10-CM | POA: Diagnosis not present

## 2019-04-10 DIAGNOSIS — E1151 Type 2 diabetes mellitus with diabetic peripheral angiopathy without gangrene: Secondary | ICD-10-CM | POA: Diagnosis not present

## 2019-04-10 DIAGNOSIS — E1142 Type 2 diabetes mellitus with diabetic polyneuropathy: Secondary | ICD-10-CM | POA: Diagnosis not present

## 2019-04-10 NOTE — Patient Outreach (Signed)
Outreach call to patient's son Traci Plemons for transition of care week 3, per Fritz Pickerel, pt is doing well at home, family has information they need about long term care planning and may wait awhile before making any decisions, patient's daughter is in touch with long term care insurance, pt has all medications and taking as prescribed, CBG ranges 120-150's in am and slightly higher in evening.  Home health is working with pt and PT saw pt today.  THN CM Care Plan Problem One     Most Recent Value  Care Plan Problem One  Knowledge deficit related to diabetes, HF, malnutrition  Role Documenting the Problem One  Care Management Coordinator  Care Plan for Problem One  Active  THN Long Term Goal   Pt will demonstrate improved self care related to DM, CHF, nutrition within 60 days  THN Long Term Goal Start Date  03/27/19  Interventions for Problem One Long Term Goal  RN CM reviewed plan of care with patient's son, pt continues checking CBG and weighing at times, did have conference call with [facility] BSW for long term planning  THN CM Short Term Goal #1   Pt will verbalize plate method and try this method within 30 days  THN CM Short Term Goal #1 Start Date  03/27/19  Interventions for Short Term Goal #1  RN CM reviewed plate method, food choices  THN CM Short Term Goal #2   Pt will weigh daily and record within 30 days  THN CM Short Term Goal #2 Start Date  03/27/19  Interventions for Short Term Goal #2  Ongoing-pt is weighing but not everyday but working towards this goal  THN CM Short Term Goal #3  Pt will slowly gain weight over the next 30 days  THN CM Short Term Goal #3 Start Date  03/27/19  Interventions for Short Tern Goal #3  Pt continues drinking glucerna and has gained a few pounds per son, RN CM reviewed nutritious food choices, importance of adequate calories, carbohydrates and protein      PLAN Continue weekly transition of care calls  Jacqlyn Larsen [facility], [place]  Coordinator [phone]

## 2019-04-11 DIAGNOSIS — E1122 Type 2 diabetes mellitus with diabetic chronic kidney disease: Secondary | ICD-10-CM | POA: Diagnosis not present

## 2019-04-11 DIAGNOSIS — N184 Chronic kidney disease, stage 4 (severe): Secondary | ICD-10-CM | POA: Diagnosis not present

## 2019-04-11 DIAGNOSIS — Z89511 Acquired absence of right leg below knee: Secondary | ICD-10-CM | POA: Diagnosis not present

## 2019-04-12 ENCOUNTER — Other Ambulatory Visit: Payer: Self-pay

## 2019-04-12 ENCOUNTER — Ambulatory Visit: Payer: Self-pay

## 2019-04-12 DIAGNOSIS — N179 Acute kidney failure, unspecified: Secondary | ICD-10-CM | POA: Diagnosis not present

## 2019-04-12 DIAGNOSIS — N189 Chronic kidney disease, unspecified: Secondary | ICD-10-CM | POA: Diagnosis not present

## 2019-04-12 DIAGNOSIS — E1151 Type 2 diabetes mellitus with diabetic peripheral angiopathy without gangrene: Secondary | ICD-10-CM | POA: Diagnosis not present

## 2019-04-12 DIAGNOSIS — E1122 Type 2 diabetes mellitus with diabetic chronic kidney disease: Secondary | ICD-10-CM | POA: Diagnosis not present

## 2019-04-12 DIAGNOSIS — I739 Peripheral vascular disease, unspecified: Secondary | ICD-10-CM | POA: Diagnosis not present

## 2019-04-12 DIAGNOSIS — E1142 Type 2 diabetes mellitus with diabetic polyneuropathy: Secondary | ICD-10-CM | POA: Diagnosis not present

## 2019-04-12 NOTE — Patient Outreach (Signed)
[facility]) Care Management  04/12/2019  CHRISTAPHER GILLIAN 16-Mar-1924 [id number]   Attempted to follow up with patient's son today regarding outreach on 04/05/19 for in home assistance.  Left voicemail message.  Will attempt to reach again within four business days if no return call.  [name], BSW Social Worker [phone]

## 2019-04-15 DIAGNOSIS — E1142 Type 2 diabetes mellitus with diabetic polyneuropathy: Secondary | ICD-10-CM | POA: Diagnosis not present

## 2019-04-15 DIAGNOSIS — E1122 Type 2 diabetes mellitus with diabetic chronic kidney disease: Secondary | ICD-10-CM | POA: Diagnosis not present

## 2019-04-15 DIAGNOSIS — N179 Acute kidney failure, unspecified: Secondary | ICD-10-CM | POA: Diagnosis not present

## 2019-04-15 DIAGNOSIS — E1151 Type 2 diabetes mellitus with diabetic peripheral angiopathy without gangrene: Secondary | ICD-10-CM | POA: Diagnosis not present

## 2019-04-15 DIAGNOSIS — I739 Peripheral vascular disease, unspecified: Secondary | ICD-10-CM | POA: Diagnosis not present

## 2019-04-15 DIAGNOSIS — N189 Chronic kidney disease, unspecified: Secondary | ICD-10-CM | POA: Diagnosis not present

## 2019-04-16 ENCOUNTER — Ambulatory Visit: Payer: Self-pay

## 2019-04-16 ENCOUNTER — Other Ambulatory Visit: Payer: Self-pay

## 2019-04-16 NOTE — Patient Outreach (Signed)
[facility]) Care Management  04/16/2019  Riley Griffith Nov 19, 1923 [id number]   Closing THN Social Work case as requested assistance/resources were provided to patient's son and daughter on 04/05/19.  Left voicemail message for son on 04/12/19 requesting return call if additional assistance is needed.  Have not received return call. Can reopen social work case if son calls back and additional assistance is needed.  [name], BSW Social Worker [phone]

## 2019-04-17 ENCOUNTER — Other Ambulatory Visit: Payer: Self-pay | Admitting: *Deleted

## 2019-04-17 DIAGNOSIS — E1142 Type 2 diabetes mellitus with diabetic polyneuropathy: Secondary | ICD-10-CM | POA: Diagnosis not present

## 2019-04-17 DIAGNOSIS — N179 Acute kidney failure, unspecified: Secondary | ICD-10-CM | POA: Diagnosis not present

## 2019-04-17 DIAGNOSIS — E1151 Type 2 diabetes mellitus with diabetic peripheral angiopathy without gangrene: Secondary | ICD-10-CM | POA: Diagnosis not present

## 2019-04-17 DIAGNOSIS — E1122 Type 2 diabetes mellitus with diabetic chronic kidney disease: Secondary | ICD-10-CM | POA: Diagnosis not present

## 2019-04-17 DIAGNOSIS — N189 Chronic kidney disease, unspecified: Secondary | ICD-10-CM | POA: Diagnosis not present

## 2019-04-17 DIAGNOSIS — I739 Peripheral vascular disease, unspecified: Secondary | ICD-10-CM | POA: Diagnosis not present

## 2019-04-17 NOTE — Patient Outreach (Signed)
Outreach call to patient's son Eisen Robenson for transition of care week 4, per Fritz Pickerel, pt saw primary care provider yesterday 04/16/19 and got a good report, was taken off eliquis, Weight 153 pounds, CBG 90-130 in morning with most readings near 100, CBG does elevate some in the evening nearing 200, primary care MD is aware of CBG readings, pt is now up to 8 units insulin.  No new concerns reported.  THN CM Care Plan Problem One     Most Recent Value  Care Plan Problem One  Knowledge deficit related to diabetes, HF, malnutrition  Role Documenting the Problem One  Care Management Coordinator  Care Plan for Problem One  Active  THN Long Term Goal   Pt will demonstrate improved self care related to DM, CHF, nutrition within 60 days  THN Long Term Goal Start Date  03/27/19  Interventions for Problem One Long Term Goal  RN CM reinforced plan of care, pt continues weighing several times per week, checking CBG, saw primary care provder yesterday 04/16/19 and is now off eliquis.  THN CM Short Term Goal #1   Pt will verbalize plate method and try this method within 30 days  THN CM Short Term Goal #1 Start Date  03/27/19  Interventions for Short Term Goal #1  RN CM reinforced plate method, reinforced nutritious food choices  THN CM Short Term Goal #2   Pt will weigh daily and record within 30 days  THN CM Short Term Goal #2 Start Date  03/27/19  Interventions for Short Term Goal #2  Ongoing- pt is weighing several times weekly  THN CM Short Term Goal #3  Pt will slowly gain weight over the next 30 days  THN CM Short Term Goal #3 Start Date  03/27/19  Interventions for Short Tern Goal #3  Pt continues drinking glucerna, has gained 2 more pounds per weight at MD visit on 04/16/19      PLAN Outreach patient's son Kessler Solly for telephone assessment next month  Jacqlyn Larsen [facility], [place] Coordinator [phone]

## 2019-04-18 DIAGNOSIS — I739 Peripheral vascular disease, unspecified: Secondary | ICD-10-CM | POA: Diagnosis not present

## 2019-04-18 DIAGNOSIS — E1122 Type 2 diabetes mellitus with diabetic chronic kidney disease: Secondary | ICD-10-CM | POA: Diagnosis not present

## 2019-04-18 DIAGNOSIS — E1142 Type 2 diabetes mellitus with diabetic polyneuropathy: Secondary | ICD-10-CM | POA: Diagnosis not present

## 2019-04-18 DIAGNOSIS — N179 Acute kidney failure, unspecified: Secondary | ICD-10-CM | POA: Diagnosis not present

## 2019-04-18 DIAGNOSIS — N189 Chronic kidney disease, unspecified: Secondary | ICD-10-CM | POA: Diagnosis not present

## 2019-04-18 DIAGNOSIS — E1151 Type 2 diabetes mellitus with diabetic peripheral angiopathy without gangrene: Secondary | ICD-10-CM | POA: Diagnosis not present

## 2019-04-19 DIAGNOSIS — I739 Peripheral vascular disease, unspecified: Secondary | ICD-10-CM | POA: Diagnosis not present

## 2019-04-19 DIAGNOSIS — E1142 Type 2 diabetes mellitus with diabetic polyneuropathy: Secondary | ICD-10-CM | POA: Diagnosis not present

## 2019-04-19 DIAGNOSIS — E1151 Type 2 diabetes mellitus with diabetic peripheral angiopathy without gangrene: Secondary | ICD-10-CM | POA: Diagnosis not present

## 2019-04-19 DIAGNOSIS — N179 Acute kidney failure, unspecified: Secondary | ICD-10-CM | POA: Diagnosis not present

## 2019-04-19 DIAGNOSIS — N189 Chronic kidney disease, unspecified: Secondary | ICD-10-CM | POA: Diagnosis not present

## 2019-04-19 DIAGNOSIS — E1122 Type 2 diabetes mellitus with diabetic chronic kidney disease: Secondary | ICD-10-CM | POA: Diagnosis not present

## 2019-04-23 DIAGNOSIS — E1122 Type 2 diabetes mellitus with diabetic chronic kidney disease: Secondary | ICD-10-CM | POA: Diagnosis not present

## 2019-04-23 DIAGNOSIS — E1151 Type 2 diabetes mellitus with diabetic peripheral angiopathy without gangrene: Secondary | ICD-10-CM | POA: Diagnosis not present

## 2019-04-23 DIAGNOSIS — N179 Acute kidney failure, unspecified: Secondary | ICD-10-CM | POA: Diagnosis not present

## 2019-04-23 DIAGNOSIS — I739 Peripheral vascular disease, unspecified: Secondary | ICD-10-CM | POA: Diagnosis not present

## 2019-04-23 DIAGNOSIS — N189 Chronic kidney disease, unspecified: Secondary | ICD-10-CM | POA: Diagnosis not present

## 2019-04-23 DIAGNOSIS — E1142 Type 2 diabetes mellitus with diabetic polyneuropathy: Secondary | ICD-10-CM | POA: Diagnosis not present

## 2019-04-25 DIAGNOSIS — N400 Enlarged prostate without lower urinary tract symptoms: Secondary | ICD-10-CM | POA: Diagnosis not present

## 2019-04-25 DIAGNOSIS — I739 Peripheral vascular disease, unspecified: Secondary | ICD-10-CM | POA: Diagnosis not present

## 2019-04-25 DIAGNOSIS — E1142 Type 2 diabetes mellitus with diabetic polyneuropathy: Secondary | ICD-10-CM | POA: Diagnosis not present

## 2019-04-25 DIAGNOSIS — I509 Heart failure, unspecified: Secondary | ICD-10-CM | POA: Diagnosis not present

## 2019-04-25 DIAGNOSIS — Z89511 Acquired absence of right leg below knee: Secondary | ICD-10-CM | POA: Diagnosis not present

## 2019-04-25 DIAGNOSIS — E785 Hyperlipidemia, unspecified: Secondary | ICD-10-CM | POA: Diagnosis not present

## 2019-04-25 DIAGNOSIS — E1122 Type 2 diabetes mellitus with diabetic chronic kidney disease: Secondary | ICD-10-CM | POA: Diagnosis not present

## 2019-04-25 DIAGNOSIS — D509 Iron deficiency anemia, unspecified: Secondary | ICD-10-CM | POA: Diagnosis not present

## 2019-04-25 DIAGNOSIS — H409 Unspecified glaucoma: Secondary | ICD-10-CM | POA: Diagnosis not present

## 2019-04-25 DIAGNOSIS — N189 Chronic kidney disease, unspecified: Secondary | ICD-10-CM | POA: Diagnosis not present

## 2019-04-25 DIAGNOSIS — E1151 Type 2 diabetes mellitus with diabetic peripheral angiopathy without gangrene: Secondary | ICD-10-CM | POA: Diagnosis not present

## 2019-04-25 DIAGNOSIS — I110 Hypertensive heart disease with heart failure: Secondary | ICD-10-CM | POA: Diagnosis not present

## 2019-04-25 DIAGNOSIS — Z7901 Long term (current) use of anticoagulants: Secondary | ICD-10-CM | POA: Diagnosis not present

## 2019-04-25 DIAGNOSIS — N179 Acute kidney failure, unspecified: Secondary | ICD-10-CM | POA: Diagnosis not present

## 2019-05-01 DIAGNOSIS — I739 Peripheral vascular disease, unspecified: Secondary | ICD-10-CM | POA: Diagnosis not present

## 2019-05-01 DIAGNOSIS — N189 Chronic kidney disease, unspecified: Secondary | ICD-10-CM | POA: Diagnosis not present

## 2019-05-01 DIAGNOSIS — N179 Acute kidney failure, unspecified: Secondary | ICD-10-CM | POA: Diagnosis not present

## 2019-05-01 DIAGNOSIS — E1142 Type 2 diabetes mellitus with diabetic polyneuropathy: Secondary | ICD-10-CM | POA: Diagnosis not present

## 2019-05-01 DIAGNOSIS — E1151 Type 2 diabetes mellitus with diabetic peripheral angiopathy without gangrene: Secondary | ICD-10-CM | POA: Diagnosis not present

## 2019-05-01 DIAGNOSIS — E1122 Type 2 diabetes mellitus with diabetic chronic kidney disease: Secondary | ICD-10-CM | POA: Diagnosis not present

## 2019-05-02 DIAGNOSIS — E1122 Type 2 diabetes mellitus with diabetic chronic kidney disease: Secondary | ICD-10-CM | POA: Diagnosis not present

## 2019-05-02 DIAGNOSIS — N184 Chronic kidney disease, stage 4 (severe): Secondary | ICD-10-CM | POA: Diagnosis not present

## 2019-05-02 DIAGNOSIS — Z89511 Acquired absence of right leg below knee: Secondary | ICD-10-CM | POA: Diagnosis not present

## 2019-05-10 DIAGNOSIS — Z23 Encounter for immunization: Secondary | ICD-10-CM | POA: Diagnosis not present

## 2019-05-14 DIAGNOSIS — E1151 Type 2 diabetes mellitus with diabetic peripheral angiopathy without gangrene: Secondary | ICD-10-CM | POA: Diagnosis not present

## 2019-05-14 DIAGNOSIS — E1140 Type 2 diabetes mellitus with diabetic neuropathy, unspecified: Secondary | ICD-10-CM | POA: Diagnosis not present

## 2019-05-15 ENCOUNTER — Encounter: Payer: Self-pay | Admitting: *Deleted

## 2019-05-15 ENCOUNTER — Other Ambulatory Visit: Payer: Self-pay | Admitting: *Deleted

## 2019-05-15 DIAGNOSIS — E1151 Type 2 diabetes mellitus with diabetic peripheral angiopathy without gangrene: Secondary | ICD-10-CM | POA: Diagnosis not present

## 2019-05-15 DIAGNOSIS — E1142 Type 2 diabetes mellitus with diabetic polyneuropathy: Secondary | ICD-10-CM | POA: Diagnosis not present

## 2019-05-15 DIAGNOSIS — N179 Acute kidney failure, unspecified: Secondary | ICD-10-CM | POA: Diagnosis not present

## 2019-05-15 DIAGNOSIS — E1122 Type 2 diabetes mellitus with diabetic chronic kidney disease: Secondary | ICD-10-CM | POA: Diagnosis not present

## 2019-05-15 DIAGNOSIS — I739 Peripheral vascular disease, unspecified: Secondary | ICD-10-CM | POA: Diagnosis not present

## 2019-05-15 DIAGNOSIS — N189 Chronic kidney disease, unspecified: Secondary | ICD-10-CM | POA: Diagnosis not present

## 2019-05-15 NOTE — Patient Outreach (Signed)
Outreach call to patient's son Nuchem Grattan for telephone assessment, pt continues to check CBG several times daily with readings in am 90-120's range, reading yesterday morning 98.  Readings in evening mostly in 100's range with occasional reading near 200.  Weight is 152-154 pounds. Pt drinking more fluids, having regular bowel movements, walking more, continues to have hired help 3 hours in morning, 3 hours in evening.  Pt saw podiatrist yesterday and had foot exam/ toenails trimmed. Pt to follow up with primary care MD in April.  THN CM Care Plan Problem One     Most Recent Value  Care Plan Problem One  Knowledge deficit related to diabetes, HF, malnutrition  Role Documenting the Problem One  Care Management Coordinator  Care Plan for Problem One  Active  THN Long Term Goal   Pt will demonstrate improved self care related to DM, CHF, nutrition within 60 days  THN Long Term Goal Start Date  03/27/19  Interventions for Problem One Long Term Goal  RN CM reviewed plan of care with patient's son Fritz Pickerel, pt continues checking CBG several times daily, weighs several times weekly, is taking in more fluids and walking more.  THN CM Short Term Goal #1   Pt will verbalize plate method and try this method within 30 days  THN CM Short Term Goal #1 Start Date  03/27/19  [facility] CM Short Term Goal #1 Met Date  05/15/19  Interventions for Short Term Goal #1  RN CM reviewed plate method, healthy food choices  THN CM Short Term Goal #2   Pt will weigh daily and record within 30 days  THN CM Short Term Goal #2 Start Date  05/15/19 [goal re-estalished]  Interventions for Short Term Goal #2  Pt weighing but not everyday,  RN CM reinforced importance of daily weights  THN CM Short Term Goal #3  Pt will slowly gain weight over the next 30 days  THN CM Short Term Goal #3 Start Date  05/15/19 Barrie Folk re-established]  Interventions for Short Tern Goal #3  RN CM reviewed importance of 3 meals daily plus snacks and continuing  glucerna, weight is 152-154 pounds.     PLAN Outreach pt/ son for telephone assessment next month Continue diet reinforcement  Jacqlyn Larsen [facility] [place] Coordinator [phone]

## 2019-05-31 DIAGNOSIS — N184 Chronic kidney disease, stage 4 (severe): Secondary | ICD-10-CM | POA: Diagnosis not present

## 2019-05-31 DIAGNOSIS — E11319 Type 2 diabetes mellitus with unspecified diabetic retinopathy without macular edema: Secondary | ICD-10-CM | POA: Diagnosis not present

## 2019-05-31 DIAGNOSIS — I129 Hypertensive chronic kidney disease with stage 1 through stage 4 chronic kidney disease, or unspecified chronic kidney disease: Secondary | ICD-10-CM | POA: Diagnosis not present

## 2019-05-31 DIAGNOSIS — E160 Drug-induced hypoglycemia without coma: Secondary | ICD-10-CM | POA: Diagnosis not present

## 2019-05-31 DIAGNOSIS — H35109 Retinopathy of prematurity, unspecified, unspecified eye: Secondary | ICD-10-CM | POA: Diagnosis not present

## 2019-05-31 DIAGNOSIS — E46 Unspecified protein-calorie malnutrition: Secondary | ICD-10-CM | POA: Diagnosis not present

## 2019-05-31 DIAGNOSIS — E782 Mixed hyperlipidemia: Secondary | ICD-10-CM | POA: Diagnosis not present

## 2019-05-31 DIAGNOSIS — Z89511 Acquired absence of right leg below knee: Secondary | ICD-10-CM | POA: Diagnosis not present

## 2019-05-31 DIAGNOSIS — I10 Essential (primary) hypertension: Secondary | ICD-10-CM | POA: Diagnosis not present

## 2019-05-31 DIAGNOSIS — E1122 Type 2 diabetes mellitus with diabetic chronic kidney disease: Secondary | ICD-10-CM | POA: Diagnosis not present

## 2019-05-31 DIAGNOSIS — H3530 Unspecified macular degeneration: Secondary | ICD-10-CM | POA: Diagnosis not present

## 2019-06-09 DIAGNOSIS — Z23 Encounter for immunization: Secondary | ICD-10-CM | POA: Diagnosis not present

## 2019-06-12 ENCOUNTER — Ambulatory Visit: Payer: PRIVATE HEALTH INSURANCE | Admitting: *Deleted

## 2019-06-18 ENCOUNTER — Other Ambulatory Visit: Payer: Self-pay | Admitting: *Deleted

## 2019-06-18 NOTE — Patient Outreach (Signed)
Telephone assessment.  Spoke with [name] today, Riley Griffith son, as instructed by prior care manager, Riley Larsen, RN. Introduced self and advised I would be following now.  Riley Griffith informed me is father is very [name] and can't hear well over the phone and his mother has dementia and that is why everyone communicates with him.  He reports his father is improving somewhat. He seems to be taking in more food and fluid. His father has complained about not sleeping well because of nocturia and he has advised him to drink the majority of his daily fluids before 6:00pm.  They have not been keeping track of his daily wts and I have requested that Riley Griffith ask the am caregiver to start weighing daily and record this.   Riley Griffith reports his father is not having any HF sxs and his glucose is monitored closely and usually in normal range in the am (100-115) and may be a bit elevated in the evenings up to 200. Discussed at his father's age, not so worried about some elevation at night, we would be more concerned about hypoglycemia. He also states that they are only giving him 8 units of his insulin instead of 10.  Riley Griffith states that the LCSW had provided information for LTC and he has "dropped the ball" on that topic but realized this is important to start planning now rather than later.  I will call again in a week to establish a good working relationship with hiim.  Riley Griffith. Riley Neither, MSN, [facility] Gerontological Nurse Practitioner [facility] Care Management [phone]

## 2019-06-25 ENCOUNTER — Other Ambulatory Visit: Payer: Self-pay | Admitting: *Deleted

## 2019-06-25 NOTE — Patient Outreach (Signed)
Telephone outreach to Riley Griffith, pt's soon to get an update on how his father is doing.  Left a message for a call back.  Ask about daily weights:  Low salt diet:  Fluid intake:   Sleep:  Glucose control:  Kayleen Memos C. Myrtie Neither, MSN, GNP-BC Gerontological Nurse Practitioner [facility] Care Management [phone]  No return call.

## 2019-06-26 ENCOUNTER — Other Ambulatory Visit: Payer: Self-pay | Admitting: *Deleted

## 2019-06-26 NOTE — Patient Outreach (Signed)
Second telephone outreach to [name], pt's son. (Mr. Lovelace Cerveny is very [facility] and Mrs. Wiltsey has dementia)  Left message requesting a return call.  Eulah Pont. Myrtie Neither, MSN, [facility] Gerontological Nurse Practitioner [facility] Care Management [phone]

## 2019-06-28 ENCOUNTER — Encounter: Payer: Self-pay | Admitting: *Deleted

## 2019-07-10 ENCOUNTER — Other Ambulatory Visit: Payer: Self-pay | Admitting: *Deleted

## 2019-07-10 ENCOUNTER — Encounter: Payer: Self-pay | Admitting: *Deleted

## 2019-07-10 NOTE — Patient Outreach (Signed)
Today pt case is being closed.  I have tried to call pt's son, Fritz Pickerel, 2 previous times without success. Today, I did talk briefly with Mr. Seibold, Sr. He reports his glucose today was 113. He says his fasting level is always under 120 and his 4 pm is usually between 160-200.  Was able then to speak with Mr. Tyrion Glaude, who was glad to talk with me today. We discussed his parents needs and the fact that Mr. Montellano has a long term care insurance plan. We discussed that he would need a doctors letter stating that the need for the assistance is needed. Also encouraged to ask how long the letter of need is good for (30 days, 6 months, etc)  Malachi Pro I will close his father's case at this time and that I will still be available for consultation if needed. Thanked him for his participation in caring for his parents.  THN CM Care Plan Problem One     Most Recent Value  Care Plan Problem One  Knowledge deficit related to diabetes, HF, malnutrition  Role Documenting the Problem One  Care Management Coordinator  Care Plan for Problem One  Active  THN Long Term Goal   Pt will demonstrate improved self care related to DM, CHF, nutrition within 60 days  THN Long Term Goal Start Date  03/27/19  [facility] Long Term Goal Met Date  07/10/19  Interventions for Problem One Long Term Goal  Good plan for now however, it either show indications they are not able to do these tasks any longer, help is requested  THN CM Short Term Goal #1   Pt will verbalize plate method and try this method within 30 days  THN CM Short Term Goal #1 Start Date  03/27/19  [facility] CM Short Term Goal #1 Met Date  05/15/19  THN CM Short Term Goal #2   Pt will weigh daily and record within 30 days  THN CM Short Term Goal #2 Start Date  06/18/19 [goal re-estalished, Started again for 3rd month.]  THN CM Short Term Goal #3  Pt will slowly gain weight over the next 30 days  THN CM Short Term Goal #3 Start Date  05/15/19 Barrie Folk re-established]   Interventions for Short Tern Goal #3  Request caregiver to assist pt to weigh daily and record. This is to measure his wt gain which is the goal, by imporoving his nutritional intake and fluids.     It has been difficult to assess if pt is meeting his wt gain goal, nutritional intake and fluids. Pt report of his glucose control is very good for 86 year old.  Eulah Pont. Myrtie Neither, MSN, [facility] Gerontological Nurse Practitioner [facility] Care Management [phone]

## 2019-07-24 DIAGNOSIS — H35043 Retinal micro-aneurysms, unspecified, bilateral: Secondary | ICD-10-CM | POA: Diagnosis not present

## 2019-07-24 DIAGNOSIS — E113511 Type 2 diabetes mellitus with proliferative diabetic retinopathy with macular edema, right eye: Secondary | ICD-10-CM | POA: Diagnosis not present

## 2019-07-24 DIAGNOSIS — H353131 Nonexudative age-related macular degeneration, bilateral, early dry stage: Secondary | ICD-10-CM | POA: Diagnosis not present

## 2019-07-24 DIAGNOSIS — E113552 Type 2 diabetes mellitus with stable proliferative diabetic retinopathy, left eye: Secondary | ICD-10-CM | POA: Diagnosis not present

## 2019-08-07 DIAGNOSIS — E119 Type 2 diabetes mellitus without complications: Secondary | ICD-10-CM | POA: Diagnosis not present

## 2019-08-07 DIAGNOSIS — D509 Iron deficiency anemia, unspecified: Secondary | ICD-10-CM | POA: Diagnosis not present

## 2019-08-07 DIAGNOSIS — N184 Chronic kidney disease, stage 4 (severe): Secondary | ICD-10-CM | POA: Diagnosis not present

## 2019-08-07 DIAGNOSIS — E1151 Type 2 diabetes mellitus with diabetic peripheral angiopathy without gangrene: Secondary | ICD-10-CM | POA: Diagnosis not present

## 2019-08-07 DIAGNOSIS — E160 Drug-induced hypoglycemia without coma: Secondary | ICD-10-CM | POA: Diagnosis not present

## 2019-08-07 DIAGNOSIS — H35109 Retinopathy of prematurity, unspecified, unspecified eye: Secondary | ICD-10-CM | POA: Diagnosis not present

## 2019-08-07 DIAGNOSIS — E785 Hyperlipidemia, unspecified: Secondary | ICD-10-CM | POA: Diagnosis not present

## 2019-08-07 DIAGNOSIS — I10 Essential (primary) hypertension: Secondary | ICD-10-CM | POA: Diagnosis not present

## 2019-08-07 DIAGNOSIS — H3530 Unspecified macular degeneration: Secondary | ICD-10-CM | POA: Diagnosis not present

## 2019-08-07 DIAGNOSIS — E1142 Type 2 diabetes mellitus with diabetic polyneuropathy: Secondary | ICD-10-CM | POA: Diagnosis not present

## 2019-08-07 DIAGNOSIS — Z89511 Acquired absence of right leg below knee: Secondary | ICD-10-CM | POA: Diagnosis not present

## 2019-08-07 DIAGNOSIS — E11319 Type 2 diabetes mellitus with unspecified diabetic retinopathy without macular edema: Secondary | ICD-10-CM | POA: Diagnosis not present

## 2019-08-07 DIAGNOSIS — E46 Unspecified protein-calorie malnutrition: Secondary | ICD-10-CM | POA: Diagnosis not present

## 2019-08-07 DIAGNOSIS — E440 Moderate protein-calorie malnutrition: Secondary | ICD-10-CM | POA: Diagnosis not present

## 2019-08-07 DIAGNOSIS — Z0001 Encounter for general adult medical examination with abnormal findings: Secondary | ICD-10-CM | POA: Diagnosis not present

## 2019-08-07 DIAGNOSIS — E782 Mixed hyperlipidemia: Secondary | ICD-10-CM | POA: Diagnosis not present

## 2019-08-07 DIAGNOSIS — I129 Hypertensive chronic kidney disease with stage 1 through stage 4 chronic kidney disease, or unspecified chronic kidney disease: Secondary | ICD-10-CM | POA: Diagnosis not present

## 2019-08-07 DIAGNOSIS — E1122 Type 2 diabetes mellitus with diabetic chronic kidney disease: Secondary | ICD-10-CM | POA: Diagnosis not present

## 2019-08-13 DIAGNOSIS — E1151 Type 2 diabetes mellitus with diabetic peripheral angiopathy without gangrene: Secondary | ICD-10-CM | POA: Diagnosis not present

## 2019-08-13 DIAGNOSIS — E1140 Type 2 diabetes mellitus with diabetic neuropathy, unspecified: Secondary | ICD-10-CM | POA: Diagnosis not present

## 2019-08-22 DIAGNOSIS — H353131 Nonexudative age-related macular degeneration, bilateral, early dry stage: Secondary | ICD-10-CM | POA: Insufficient documentation

## 2019-08-22 DIAGNOSIS — H4720 Unspecified optic atrophy: Secondary | ICD-10-CM | POA: Insufficient documentation

## 2019-08-22 DIAGNOSIS — E113511 Type 2 diabetes mellitus with proliferative diabetic retinopathy with macular edema, right eye: Secondary | ICD-10-CM | POA: Insufficient documentation

## 2019-08-22 DIAGNOSIS — E113551 Type 2 diabetes mellitus with stable proliferative diabetic retinopathy, right eye: Secondary | ICD-10-CM | POA: Insufficient documentation

## 2019-08-22 DIAGNOSIS — E113552 Type 2 diabetes mellitus with stable proliferative diabetic retinopathy, left eye: Secondary | ICD-10-CM | POA: Insufficient documentation

## 2019-08-22 DIAGNOSIS — H35373 Puckering of macula, bilateral: Secondary | ICD-10-CM | POA: Insufficient documentation

## 2019-08-22 DIAGNOSIS — H35363 Drusen (degenerative) of macula, bilateral: Secondary | ICD-10-CM | POA: Insufficient documentation

## 2019-08-23 ENCOUNTER — Encounter (INDEPENDENT_AMBULATORY_CARE_PROVIDER_SITE_OTHER): Payer: Self-pay | Admitting: Ophthalmology

## 2019-08-23 ENCOUNTER — Other Ambulatory Visit: Payer: Self-pay

## 2019-08-23 ENCOUNTER — Ambulatory Visit (INDEPENDENT_AMBULATORY_CARE_PROVIDER_SITE_OTHER): Payer: Medicare Other | Admitting: Ophthalmology

## 2019-08-23 DIAGNOSIS — E113511 Type 2 diabetes mellitus with proliferative diabetic retinopathy with macular edema, right eye: Secondary | ICD-10-CM | POA: Diagnosis not present

## 2019-08-23 DIAGNOSIS — H35373 Puckering of macula, bilateral: Secondary | ICD-10-CM

## 2019-08-23 DIAGNOSIS — E113552 Type 2 diabetes mellitus with stable proliferative diabetic retinopathy, left eye: Secondary | ICD-10-CM

## 2019-08-23 DIAGNOSIS — H353131 Nonexudative age-related macular degeneration, bilateral, early dry stage: Secondary | ICD-10-CM

## 2019-08-23 DIAGNOSIS — H4720 Unspecified optic atrophy: Secondary | ICD-10-CM

## 2019-08-23 DIAGNOSIS — H35363 Drusen (degenerative) of macula, bilateral: Secondary | ICD-10-CM

## 2019-08-23 NOTE — Progress Notes (Signed)
08/23/2019     CHIEF COMPLAINT Patient presents for Diabetic Eye Exam   HISTORY OF PRESENT ILLNESS: Riley Griffith is a 86 y.o. male who presents to the clinic today for:   HPI    Diabetic Eye Exam    Vision is stable.  Associated Symptoms Negative for Flashes, Floaters and Distortion.  Diabetes characteristics include Type 2.  Blood sugar level is controlled.  Last Blood Glucose 128.  I, the attending physician,  performed the HPI with the patient and updated documentation appropriately.          Comments    1 Month f\u OD. PRP OD  Pt states vision  Is stable. Denies any complaints.       Last edited by Tilda Franco on 08/23/2019 10:06 AM. (History)      Referring physician: [name], MD [place],  [place] [address]  HISTORICAL INFORMATION:   Selected notes from the MEDICAL RECORD NUMBER       CURRENT MEDICATIONS: Current Outpatient Medications (Ophthalmic Drugs)  Medication Sig  . bimatoprost (LUMIGAN) 0.01 % SOLN Place 1 drop into the left eye at bedtime.  . brimonidine (ALPHAGAN P) 0.1 % SOLN Place 1 drop into the left eye 2 (two) times daily.   No current facility-administered medications for this visit. (Ophthalmic Drugs)   Current Outpatient Medications (Other)  Medication Sig  . acetaminophen (TYLENOL) 325 MG tablet Take 2 tablets (650 mg total) by mouth every 6 (six) hours as needed for mild pain, fever or headache (or Fever >/= 101).  . calcitRIOL (ROCALTROL) 0.25 MCG capsule Take 0.25 mcg by mouth daily.  . carvedilol (COREG) 3.125 MG tablet Take 1 tablet (3.125 mg total) by mouth 2 (two) times daily with a meal.  . cholecalciferol (VITAMIN D) 1000 UNITS tablet Take 1,000 Units by mouth daily.    Marland Kitchen co-enzyme Q-10 30 MG capsule Take 30 mg by mouth daily.    . ferrous sulfate 325 (65 FE) MG tablet Take 325 mg by mouth daily with breakfast.  . furosemide (LASIX) 20 MG tablet Take 20 mg by mouth daily. For swelling   . insulin  glargine (LANTUS) 100 UNIT/ML injection Inject 10 Units into the skin at bedtime.   Marland Kitchen lisinopril (PRINIVIL,ZESTRIL) 10 MG tablet Take 10 mg by mouth daily.    . ondansetron (ZOFRAN ODT) 4 MG disintegrating tablet Take 1 tablet (4 mg total) by mouth every 8 (eight) hours as needed for nausea or vomiting.  . senna-docusate (SENOKOT-S) 8.6-50 MG tablet Take 2 tablets by mouth at bedtime. For Bowels/Constipation--- skip if you have Loose stools  . sodium bicarbonate 650 MG tablet Take 650 mg by mouth 2 (two) times daily.  . Tamsulosin HCl (FLOMAX) 0.4 MG CAPS Take 0.4 mg by mouth daily after supper.    . vitamin C (ASCORBIC ACID) 500 MG tablet Take 500 mg by mouth daily.     No current facility-administered medications for this visit. (Other)      REVIEW OF SYSTEMS:    ALLERGIES No Known Allergies  PAST MEDICAL HISTORY Past Medical History:  Diagnosis Date  . Diabetes mellitus   . GERD (gastroesophageal reflux disease)   . Hypertension   . Osteomyelitis of foot ([place])   . Renal disorder    see Dr Tawni Carnes   Past Surgical History:  Procedure Laterality Date  . AMPUTATION  01/25/2012   Procedure: AMPUTATION BELOW KNEE;  Surgeon: Rozanna Box, MD;  Location: [facility];  Service: Orthopedics;  Laterality: Right;  REVISION OF BELOW KNEE  AMPUTATION   . APPLICATION OF WOUND VAC  01/25/2012   Procedure: APPLICATION OF WOUND VAC;  Surgeon: Rozanna Box, MD;  Location: [facility];  Service: Orthopedics;  Laterality: N/A;  AND PLACEMENT OF WOUND VAC   . LEG AMPUTATION BELOW KNEE     rt knee    FAMILY HISTORY History reviewed. No pertinent family history.  SOCIAL HISTORY Social History   Tobacco Use  . Smoking status: Never Smoker  . Smokeless tobacco: Never Used  Substance Use Topics  . Alcohol use: No  . Drug use: No         OPHTHALMIC EXAM:  Base Eye Exam    Visual Acuity (Snellen - Linear)      Right Left   Dist [place] 20/40 20/200 +   Dist ph [place] NI NI       Tonometry  (Tonopen, 10:12 AM)      Right Left   Pressure 23 21       Pupils      Pupils Dark Light Shape React APD   Right PERRL 2 2 Round Minimal None   Left PERRL 2 2 Round Minimal None       Visual Fields (Counting fingers)      Left Right   Restrictions Partial outer superior nasal deficiency Partial outer superior nasal deficiency       Neuro/Psych    Oriented x3: Yes   Mood/Affect: Normal       Dilation    Right eye: 1.0% Mydriacyl, 2.5% Phenylephrine @ 10:12 AM          IMAGING AND PROCEDURES  Imaging and Procedures for @TODAY @  Panretinal Photocoagulation - OD - Right Eye       Time Out Confirmed correct patient, procedure, site, and patient consented.   Anesthesia Topical anesthesia was used. Anesthetic medications included Proparacaine 0.5%.   Laser Information The type of laser was diode. Color was yellow. The duration in seconds was 0.02. The spot size was 390 microns. Laser power was 280. Total spots was 846.   Post-op The patient tolerated the procedure well. There were no complications. The patient received written and verbal post procedure care education.   Notes Laser applied temporally superiorly and nasally anterior to the equator                ASSESSMENT/PLAN:  Diabetic macular edema of right eye with proliferative retinopathy associated with type 2 diabetes mellitus (HCC) PRP OD needed today to induce quiescent subclinical diabetic retinopathy and prevent progression      ICD-10-CM   1. Diabetic macular edema of right eye with proliferative retinopathy associated with type 2 diabetes mellitus ([place])  G95.6213 Panretinal Photocoagulation - OD - Right Eye  2. Early stage nonexudative age-related macular degeneration of both eyes  H35.3131   3. Stable treated proliferative diabetic retinopathy of left eye determined by examination associated with type 2 diabetes mellitus ([place])  Y86.5784   4. Optic disc atrophy, bilateral  H47.20   5.  Degenerative retinal drusen of both eyes  H35.363   6. Bilateral epiretinal membrane  H35.373     1.  2.  3.  Ophthalmic Meds Ordered this visit:  No orders of the defined types were placed in this encounter.      Return in about 3 months (around 11/22/2019) for DILATE OU, COLOR FP.  There are no Patient Instructions on  file for this visit.   Explained the diagnoses, plan, and follow up with the patient and they expressed understanding.  Patient expressed understanding of the importance of proper follow up care.   Clent Demark Sharada Albornoz M.D. Diseases & Surgery of the Retina and Vitreous Retina & Diabetic [place] @TODAY @     Abbreviations: M myopia (nearsighted); A astigmatism; H hyperopia (farsighted); P presbyopia; Mrx spectacle prescription;  CTL contact lenses; OD right eye; OS left eye; OU both eyes  XT exotropia; ET esotropia; PEK punctate epithelial keratitis; PEE punctate epithelial erosions; DES dry eye syndrome; MGD meibomian gland dysfunction; ATs artificial tears; PFAT's preservative free artificial tears; [place] nuclear sclerotic cataract; PSC posterior subcapsular cataract; ERM epi-retinal membrane; PVD posterior vitreous detachment; RD retinal detachment; DM diabetes mellitus; DR diabetic retinopathy; NPDR non-proliferative diabetic retinopathy; PDR proliferative diabetic retinopathy; CSME clinically significant macular edema; DME diabetic macular edema; dbh dot blot hemorrhages; CWS cotton wool spot; POAG primary open angle glaucoma; C/D cup-to-disc ratio; HVF humphrey visual field; GVF goldmann visual field; OCT optical coherence tomography; IOP intraocular pressure; BRVO Branch retinal vein occlusion; CRVO central retinal vein occlusion; CRAO central retinal artery occlusion; BRAO branch retinal artery occlusion; RT retinal tear; SB scleral buckle; PPV pars plana vitrectomy; VH Vitreous hemorrhage; PRP panretinal laser photocoagulation; IVK intravitreal kenalog; VMT  vitreomacular traction; MH Macular hole;  NVD neovascularization of the disc; NVE neovascularization elsewhere; AREDS age related eye disease study; ARMD age related macular degeneration; POAG primary open angle glaucoma; EBMD epithelial/anterior basement membrane dystrophy; ACIOL anterior chamber intraocular lens; IOL intraocular lens; PCIOL posterior chamber intraocular lens; Phaco/IOL phacoemulsification with intraocular lens placement; [place] photorefractive keratectomy; LASIK laser assisted in situ keratomileusis; HTN hypertension; DM diabetes mellitus; COPD chronic obstructive pulmonary disease

## 2019-08-23 NOTE — Assessment & Plan Note (Signed)
PRP OD needed today to induce quiescent subclinical diabetic retinopathy and prevent progression

## 2019-08-24 DIAGNOSIS — I129 Hypertensive chronic kidney disease with stage 1 through stage 4 chronic kidney disease, or unspecified chronic kidney disease: Secondary | ICD-10-CM | POA: Diagnosis not present

## 2019-08-24 DIAGNOSIS — E211 Secondary hyperparathyroidism, not elsewhere classified: Secondary | ICD-10-CM | POA: Diagnosis not present

## 2019-08-24 DIAGNOSIS — R809 Proteinuria, unspecified: Secondary | ICD-10-CM | POA: Diagnosis not present

## 2019-08-24 DIAGNOSIS — Z79899 Other long term (current) drug therapy: Secondary | ICD-10-CM | POA: Diagnosis not present

## 2019-08-24 DIAGNOSIS — N189 Chronic kidney disease, unspecified: Secondary | ICD-10-CM | POA: Diagnosis not present

## 2019-08-24 DIAGNOSIS — E1122 Type 2 diabetes mellitus with diabetic chronic kidney disease: Secondary | ICD-10-CM | POA: Diagnosis not present

## 2019-08-24 DIAGNOSIS — E1129 Type 2 diabetes mellitus with other diabetic kidney complication: Secondary | ICD-10-CM | POA: Diagnosis not present

## 2019-08-24 DIAGNOSIS — D631 Anemia in chronic kidney disease: Secondary | ICD-10-CM | POA: Diagnosis not present

## 2019-09-06 DIAGNOSIS — H3530 Unspecified macular degeneration: Secondary | ICD-10-CM | POA: Diagnosis not present

## 2019-09-06 DIAGNOSIS — H35109 Retinopathy of prematurity, unspecified, unspecified eye: Secondary | ICD-10-CM | POA: Diagnosis not present

## 2019-09-06 DIAGNOSIS — N184 Chronic kidney disease, stage 4 (severe): Secondary | ICD-10-CM | POA: Diagnosis not present

## 2019-09-06 DIAGNOSIS — I10 Essential (primary) hypertension: Secondary | ICD-10-CM | POA: Diagnosis not present

## 2019-09-06 DIAGNOSIS — Z89511 Acquired absence of right leg below knee: Secondary | ICD-10-CM | POA: Diagnosis not present

## 2019-09-06 DIAGNOSIS — E782 Mixed hyperlipidemia: Secondary | ICD-10-CM | POA: Diagnosis not present

## 2019-09-06 DIAGNOSIS — E1122 Type 2 diabetes mellitus with diabetic chronic kidney disease: Secondary | ICD-10-CM | POA: Diagnosis not present

## 2019-09-06 DIAGNOSIS — Z0001 Encounter for general adult medical examination with abnormal findings: Secondary | ICD-10-CM | POA: Diagnosis not present

## 2019-09-06 DIAGNOSIS — E46 Unspecified protein-calorie malnutrition: Secondary | ICD-10-CM | POA: Diagnosis not present

## 2019-09-06 DIAGNOSIS — I129 Hypertensive chronic kidney disease with stage 1 through stage 4 chronic kidney disease, or unspecified chronic kidney disease: Secondary | ICD-10-CM | POA: Diagnosis not present

## 2019-09-06 DIAGNOSIS — E160 Drug-induced hypoglycemia without coma: Secondary | ICD-10-CM | POA: Diagnosis not present

## 2019-09-06 DIAGNOSIS — E11319 Type 2 diabetes mellitus with unspecified diabetic retinopathy without macular edema: Secondary | ICD-10-CM | POA: Diagnosis not present

## 2019-10-04 DIAGNOSIS — E1142 Type 2 diabetes mellitus with diabetic polyneuropathy: Secondary | ICD-10-CM | POA: Diagnosis not present

## 2019-10-04 DIAGNOSIS — E1122 Type 2 diabetes mellitus with diabetic chronic kidney disease: Secondary | ICD-10-CM | POA: Diagnosis not present

## 2019-10-04 DIAGNOSIS — E440 Moderate protein-calorie malnutrition: Secondary | ICD-10-CM | POA: Diagnosis not present

## 2019-10-04 DIAGNOSIS — E1151 Type 2 diabetes mellitus with diabetic peripheral angiopathy without gangrene: Secondary | ICD-10-CM | POA: Diagnosis not present

## 2019-10-04 DIAGNOSIS — H35109 Retinopathy of prematurity, unspecified, unspecified eye: Secondary | ICD-10-CM | POA: Diagnosis not present

## 2019-10-04 DIAGNOSIS — E11319 Type 2 diabetes mellitus with unspecified diabetic retinopathy without macular edema: Secondary | ICD-10-CM | POA: Diagnosis not present

## 2019-10-04 DIAGNOSIS — E782 Mixed hyperlipidemia: Secondary | ICD-10-CM | POA: Diagnosis not present

## 2019-10-04 DIAGNOSIS — E160 Drug-induced hypoglycemia without coma: Secondary | ICD-10-CM | POA: Diagnosis not present

## 2019-10-04 DIAGNOSIS — D509 Iron deficiency anemia, unspecified: Secondary | ICD-10-CM | POA: Diagnosis not present

## 2019-10-04 DIAGNOSIS — E46 Unspecified protein-calorie malnutrition: Secondary | ICD-10-CM | POA: Diagnosis not present

## 2019-10-04 DIAGNOSIS — E119 Type 2 diabetes mellitus without complications: Secondary | ICD-10-CM | POA: Diagnosis not present

## 2019-10-04 DIAGNOSIS — E785 Hyperlipidemia, unspecified: Secondary | ICD-10-CM | POA: Diagnosis not present

## 2019-11-05 DIAGNOSIS — E1151 Type 2 diabetes mellitus with diabetic peripheral angiopathy without gangrene: Secondary | ICD-10-CM | POA: Diagnosis not present

## 2019-11-05 DIAGNOSIS — E1140 Type 2 diabetes mellitus with diabetic neuropathy, unspecified: Secondary | ICD-10-CM | POA: Diagnosis not present

## 2019-11-22 ENCOUNTER — Ambulatory Visit (INDEPENDENT_AMBULATORY_CARE_PROVIDER_SITE_OTHER): Payer: Medicare Other | Admitting: Ophthalmology

## 2019-11-22 ENCOUNTER — Encounter (INDEPENDENT_AMBULATORY_CARE_PROVIDER_SITE_OTHER): Payer: Self-pay | Admitting: Ophthalmology

## 2019-11-22 ENCOUNTER — Other Ambulatory Visit: Payer: Self-pay

## 2019-11-22 DIAGNOSIS — E113511 Type 2 diabetes mellitus with proliferative diabetic retinopathy with macular edema, right eye: Secondary | ICD-10-CM | POA: Diagnosis not present

## 2019-11-22 NOTE — Progress Notes (Signed)
11/22/2019     CHIEF COMPLAINT Patient presents for Retina Follow Up   HISTORY OF PRESENT ILLNESS: Riley Griffith is a 86 y.o. male who presents to the clinic today for:   HPI    Retina Follow Up    Patient presents with  Diabetic Retinopathy.  In both eyes.  Duration of 3 months.  Since onset it is stable.          Comments    3 month follow up - FP OU Patient denies change in vision and overall has no complaints. LBS 128 /// A1C ??        Last edited by Gerda Diss on 11/22/2019 10:29 AM. (History)      Referring physician: [name], MD [place],  [address] [address]  HISTORICAL INFORMATION:   Selected notes from the MEDICAL RECORD NUMBER       CURRENT MEDICATIONS: Current Outpatient Medications (Ophthalmic Drugs)  Medication Sig  . bimatoprost (LUMIGAN) 0.01 % SOLN Place 1 drop into the left eye at bedtime.  . brimonidine (ALPHAGAN P) 0.1 % SOLN Place 1 drop into the left eye 2 (two) times daily.   No current facility-administered medications for this visit. (Ophthalmic Drugs)   Current Outpatient Medications (Other)  Medication Sig  . acetaminophen (TYLENOL) 325 MG tablet Take 2 tablets (650 mg total) by mouth every 6 (six) hours as needed for mild pain, fever or headache (or Fever >/= 101).  . calcitRIOL (ROCALTROL) 0.25 MCG capsule Take 0.25 mcg by mouth daily.  . carvedilol (COREG) 3.125 MG tablet Take 1 tablet (3.125 mg total) by mouth 2 (two) times daily with a meal.  . cholecalciferol (VITAMIN D) 1000 UNITS tablet Take 1,000 Units by mouth daily.    Marland Kitchen co-enzyme Q-10 30 MG capsule Take 30 mg by mouth daily.    . ferrous sulfate 325 (65 FE) MG tablet Take 325 mg by mouth daily with breakfast.  . furosemide (LASIX) 20 MG tablet Take 20 mg by mouth daily. For swelling   . insulin glargine (LANTUS) 100 UNIT/ML injection Inject 10 Units into the skin at bedtime.   Marland Kitchen lisinopril (PRINIVIL,ZESTRIL) 10 MG tablet Take 10 mg by mouth daily.      . ondansetron (ZOFRAN ODT) 4 MG disintegrating tablet Take 1 tablet (4 mg total) by mouth every 8 (eight) hours as needed for nausea or vomiting.  . sodium bicarbonate 650 MG tablet Take 650 mg by mouth 2 (two) times daily.  . Tamsulosin HCl (FLOMAX) 0.4 MG CAPS Take 0.4 mg by mouth daily after supper.    . vitamin C (ASCORBIC ACID) 500 MG tablet Take 500 mg by mouth daily.     No current facility-administered medications for this visit. (Other)      REVIEW OF SYSTEMS:    ALLERGIES No Known Allergies  PAST MEDICAL HISTORY Past Medical History:  Diagnosis Date  . Diabetes mellitus   . GERD (gastroesophageal reflux disease)   . Hypertension   . Osteomyelitis of foot ([place])   . Renal disorder    see Dr Tawni Carnes   Past Surgical History:  Procedure Laterality Date  . AMPUTATION  01/25/2012   Procedure: AMPUTATION BELOW KNEE;  Surgeon: Rozanna Box, MD;  Location: [facility];  Service: Orthopedics;  Laterality: Right;  REVISION OF BELOW KNEE  AMPUTATION   . APPLICATION OF WOUND VAC  01/25/2012   Procedure: APPLICATION OF WOUND VAC;  Surgeon: Rozanna Box, MD;  Location: [facility];  Service: Orthopedics;  Laterality: N/A;  AND PLACEMENT OF WOUND VAC   . LEG AMPUTATION BELOW KNEE     rt knee    FAMILY HISTORY History reviewed. No pertinent family history.  SOCIAL HISTORY Social History   Tobacco Use  . Smoking status: Never Smoker  . Smokeless tobacco: Never Used  Substance Use Topics  . Alcohol use: No  . Drug use: No         OPHTHALMIC EXAM:  Base Eye Exam    Visual Acuity (Snellen - Linear)      Right Left   Dist [place] 20/30-2 20/100+1   Dist ph [place]  NI       Tonometry (Tonopen, 10:35 AM)      Right Left   Pressure 21 20       Pupils      Pupils Dark Light Shape React APD   Right PERRL 2 2 Round Minimal None   Left PERRL 2 2 Round Minimal None       Visual Fields (Counting fingers)      Left Right   Restrictions Partial outer superior nasal deficiency  Partial outer superior nasal deficiency       Extraocular Movement      Right Left    Full Full       Neuro/Psych    Oriented x3: Yes   Mood/Affect: Normal       Dilation    Both eyes: 1.0% Mydriacyl, 2.5% Phenylephrine @ 10:35 AM        Slit Lamp and Fundus Exam    External Exam      Right Left   External Normal Normal       Slit Lamp Exam      Right Left   Lids/Lashes Normal Normal   Conjunctiva/Sclera White and quiet White and quiet   Cornea Clear Clear   Anterior Chamber Deep and quiet Deep and quiet   Iris Round and reactive Round and reactive   Lens Posterior chamber intraocular lens Posterior chamber intraocular lens   Anterior Vitreous Normal Normal       Fundus Exam      Right Left   Posterior Vitreous Posterior vitreous detachment Posterior vitreous detachment   Disc Normal Normal   C/D Ratio 0.55 0.5   Macula no macular thickening, Focal laser scars, no exudates, Microaneurysms no macular thickening, Focal laser scars, no exudates, Microaneurysms   Vessels PDR-quiet PDR-quiet   Periphery Good PRP  Good PRP           IMAGING AND PROCEDURES  Imaging and Procedures for 11/22/19  Color Fundus Photography Optos - OU - Both Eyes       Right Eye Progression has been stable. Macula : microaneurysms.   Left Eye Progression has been stable. Disc findings include normal observations. Macula : microaneurysms.   Notes OD, Good PRP 360 now PDR quiet  No large regions of nonperfusion nor untreated.  OS, clear media, good focal, good PRP, PDR quiet                ASSESSMENT/PLAN:  Diabetic macular edema of right eye with proliferative retinopathy associated with type 2 diabetes mellitus ([place]) PDR OD now quiet and protected retina should the patient have significant systemic illness in the future.      ICD-10-CM   1. Diabetic macular edema of right eye with proliferative retinopathy associated with type 2 diabetes mellitus (HCC)  Q67.6195  Color Fundus Photography  Optos - OU - Both Eyes    1.  2.  3.  Ophthalmic Meds Ordered this visit:  No orders of the defined types were placed in this encounter.      Return in about 6 months (around 05/24/2020) for DILATE OU, COLOR FP.  Patient Instructions  Patient is to notify the practice should new difficulties floaters or flashes or decline in acuity occur    Explained the diagnoses, plan, and follow up with the patient and they expressed understanding.  Patient expressed understanding of the importance of proper follow up care.   Clent Demark Ronal Maybury M.D. Diseases & Surgery of the Retina and Vitreous Retina & Diabetic [place] 11/22/19     Abbreviations: M myopia (nearsighted); A astigmatism; H hyperopia (farsighted); P presbyopia; Mrx spectacle prescription;  CTL contact lenses; OD right eye; OS left eye; OU both eyes  XT exotropia; ET esotropia; PEK punctate epithelial keratitis; PEE punctate epithelial erosions; DES dry eye syndrome; MGD meibomian gland dysfunction; ATs artificial tears; PFAT's preservative free artificial tears; [place] nuclear sclerotic cataract; PSC posterior subcapsular cataract; ERM epi-retinal membrane; PVD posterior vitreous detachment; RD retinal detachment; DM diabetes mellitus; DR diabetic retinopathy; NPDR non-proliferative diabetic retinopathy; PDR proliferative diabetic retinopathy; CSME clinically significant macular edema; DME diabetic macular edema; dbh dot blot hemorrhages; CWS cotton wool spot; POAG primary open angle glaucoma; C/D cup-to-disc ratio; HVF humphrey visual field; GVF goldmann visual field; OCT optical coherence tomography; IOP intraocular pressure; BRVO Branch retinal vein occlusion; CRVO central retinal vein occlusion; CRAO central retinal artery occlusion; BRAO branch retinal artery occlusion; RT retinal tear; SB scleral buckle; PPV pars plana vitrectomy; VH Vitreous hemorrhage; PRP panretinal laser photocoagulation; IVK  intravitreal kenalog; VMT vitreomacular traction; MH Macular hole;  NVD neovascularization of the disc; NVE neovascularization elsewhere; AREDS age related eye disease study; ARMD age related macular degeneration; POAG primary open angle glaucoma; EBMD epithelial/anterior basement membrane dystrophy; ACIOL anterior chamber intraocular lens; IOL intraocular lens; PCIOL posterior chamber intraocular lens; Phaco/IOL phacoemulsification with intraocular lens placement; [place] photorefractive keratectomy; LASIK laser assisted in situ keratomileusis; HTN hypertension; DM diabetes mellitus; COPD chronic obstructive pulmonary disease

## 2019-11-22 NOTE — Patient Instructions (Signed)
Patient is to notify the practice should new difficulties floaters or flashes or decline in acuity occur

## 2019-11-22 NOTE — Assessment & Plan Note (Signed)
PDR OD now quiet and protected retina should the patient have significant systemic illness in the future.

## 2019-11-23 DIAGNOSIS — E160 Drug-induced hypoglycemia without coma: Secondary | ICD-10-CM | POA: Diagnosis not present

## 2019-11-23 DIAGNOSIS — E46 Unspecified protein-calorie malnutrition: Secondary | ICD-10-CM | POA: Diagnosis not present

## 2019-11-23 DIAGNOSIS — E11319 Type 2 diabetes mellitus with unspecified diabetic retinopathy without macular edema: Secondary | ICD-10-CM | POA: Diagnosis not present

## 2019-11-23 DIAGNOSIS — I129 Hypertensive chronic kidney disease with stage 1 through stage 4 chronic kidney disease, or unspecified chronic kidney disease: Secondary | ICD-10-CM | POA: Diagnosis not present

## 2019-11-23 DIAGNOSIS — N184 Chronic kidney disease, stage 4 (severe): Secondary | ICD-10-CM | POA: Diagnosis not present

## 2019-11-23 DIAGNOSIS — I10 Essential (primary) hypertension: Secondary | ICD-10-CM | POA: Diagnosis not present

## 2019-11-23 DIAGNOSIS — E782 Mixed hyperlipidemia: Secondary | ICD-10-CM | POA: Diagnosis not present

## 2019-11-23 DIAGNOSIS — Z89511 Acquired absence of right leg below knee: Secondary | ICD-10-CM | POA: Diagnosis not present

## 2019-11-23 DIAGNOSIS — E1122 Type 2 diabetes mellitus with diabetic chronic kidney disease: Secondary | ICD-10-CM | POA: Diagnosis not present

## 2019-11-23 DIAGNOSIS — H35109 Retinopathy of prematurity, unspecified, unspecified eye: Secondary | ICD-10-CM | POA: Diagnosis not present

## 2019-11-23 DIAGNOSIS — H3530 Unspecified macular degeneration: Secondary | ICD-10-CM | POA: Diagnosis not present

## 2019-12-10 DIAGNOSIS — C44319 Basal cell carcinoma of skin of other parts of face: Secondary | ICD-10-CM | POA: Diagnosis not present

## 2019-12-10 DIAGNOSIS — L570 Actinic keratosis: Secondary | ICD-10-CM | POA: Diagnosis not present

## 2019-12-10 DIAGNOSIS — X32XXXA Exposure to sunlight, initial encounter: Secondary | ICD-10-CM | POA: Diagnosis not present

## 2019-12-10 DIAGNOSIS — L308 Other specified dermatitis: Secondary | ICD-10-CM | POA: Diagnosis not present

## 2019-12-18 DIAGNOSIS — I739 Peripheral vascular disease, unspecified: Secondary | ICD-10-CM | POA: Diagnosis not present

## 2019-12-18 DIAGNOSIS — E1142 Type 2 diabetes mellitus with diabetic polyneuropathy: Secondary | ICD-10-CM | POA: Diagnosis not present

## 2019-12-18 DIAGNOSIS — N189 Chronic kidney disease, unspecified: Secondary | ICD-10-CM | POA: Diagnosis not present

## 2019-12-18 DIAGNOSIS — Z89511 Acquired absence of right leg below knee: Secondary | ICD-10-CM | POA: Diagnosis not present

## 2019-12-18 DIAGNOSIS — I110 Hypertensive heart disease with heart failure: Secondary | ICD-10-CM | POA: Diagnosis not present

## 2019-12-18 DIAGNOSIS — N179 Acute kidney failure, unspecified: Secondary | ICD-10-CM | POA: Diagnosis not present

## 2019-12-18 DIAGNOSIS — E1122 Type 2 diabetes mellitus with diabetic chronic kidney disease: Secondary | ICD-10-CM | POA: Diagnosis not present

## 2019-12-18 DIAGNOSIS — E113551 Type 2 diabetes mellitus with stable proliferative diabetic retinopathy, right eye: Secondary | ICD-10-CM | POA: Diagnosis not present

## 2019-12-18 DIAGNOSIS — E785 Hyperlipidemia, unspecified: Secondary | ICD-10-CM | POA: Diagnosis not present

## 2019-12-18 DIAGNOSIS — Z6821 Body mass index (BMI) 21.0-21.9, adult: Secondary | ICD-10-CM | POA: Diagnosis not present

## 2019-12-18 DIAGNOSIS — N184 Chronic kidney disease, stage 4 (severe): Secondary | ICD-10-CM | POA: Diagnosis not present

## 2019-12-18 DIAGNOSIS — E1151 Type 2 diabetes mellitus with diabetic peripheral angiopathy without gangrene: Secondary | ICD-10-CM | POA: Diagnosis not present

## 2019-12-18 DIAGNOSIS — N400 Enlarged prostate without lower urinary tract symptoms: Secondary | ICD-10-CM | POA: Diagnosis not present

## 2019-12-18 DIAGNOSIS — I129 Hypertensive chronic kidney disease with stage 1 through stage 4 chronic kidney disease, or unspecified chronic kidney disease: Secondary | ICD-10-CM | POA: Diagnosis not present

## 2019-12-18 DIAGNOSIS — H409 Unspecified glaucoma: Secondary | ICD-10-CM | POA: Diagnosis not present

## 2019-12-18 DIAGNOSIS — D509 Iron deficiency anemia, unspecified: Secondary | ICD-10-CM | POA: Diagnosis not present

## 2019-12-18 DIAGNOSIS — I10 Essential (primary) hypertension: Secondary | ICD-10-CM | POA: Diagnosis not present

## 2020-01-16 DIAGNOSIS — I10 Essential (primary) hypertension: Secondary | ICD-10-CM | POA: Diagnosis not present

## 2020-01-16 DIAGNOSIS — E1122 Type 2 diabetes mellitus with diabetic chronic kidney disease: Secondary | ICD-10-CM | POA: Diagnosis not present

## 2020-01-16 DIAGNOSIS — N184 Chronic kidney disease, stage 4 (severe): Secondary | ICD-10-CM | POA: Diagnosis not present

## 2020-01-16 DIAGNOSIS — Z89511 Acquired absence of right leg below knee: Secondary | ICD-10-CM | POA: Diagnosis not present

## 2020-01-16 DIAGNOSIS — E113551 Type 2 diabetes mellitus with stable proliferative diabetic retinopathy, right eye: Secondary | ICD-10-CM | POA: Diagnosis not present

## 2020-01-21 DIAGNOSIS — L570 Actinic keratosis: Secondary | ICD-10-CM | POA: Diagnosis not present

## 2020-01-21 DIAGNOSIS — Z08 Encounter for follow-up examination after completed treatment for malignant neoplasm: Secondary | ICD-10-CM | POA: Diagnosis not present

## 2020-01-21 DIAGNOSIS — E1151 Type 2 diabetes mellitus with diabetic peripheral angiopathy without gangrene: Secondary | ICD-10-CM | POA: Diagnosis not present

## 2020-01-21 DIAGNOSIS — Z85828 Personal history of other malignant neoplasm of skin: Secondary | ICD-10-CM | POA: Diagnosis not present

## 2020-01-21 DIAGNOSIS — X32XXXD Exposure to sunlight, subsequent encounter: Secondary | ICD-10-CM | POA: Diagnosis not present

## 2020-01-21 DIAGNOSIS — E1140 Type 2 diabetes mellitus with diabetic neuropathy, unspecified: Secondary | ICD-10-CM | POA: Diagnosis not present

## 2020-01-21 DIAGNOSIS — L308 Other specified dermatitis: Secondary | ICD-10-CM | POA: Diagnosis not present

## 2020-02-01 DIAGNOSIS — E1122 Type 2 diabetes mellitus with diabetic chronic kidney disease: Secondary | ICD-10-CM | POA: Diagnosis not present

## 2020-02-01 DIAGNOSIS — N184 Chronic kidney disease, stage 4 (severe): Secondary | ICD-10-CM | POA: Diagnosis not present

## 2020-02-01 DIAGNOSIS — I129 Hypertensive chronic kidney disease with stage 1 through stage 4 chronic kidney disease, or unspecified chronic kidney disease: Secondary | ICD-10-CM | POA: Diagnosis not present

## 2020-02-01 DIAGNOSIS — Z89511 Acquired absence of right leg below knee: Secondary | ICD-10-CM | POA: Diagnosis not present

## 2020-02-01 DIAGNOSIS — E113551 Type 2 diabetes mellitus with stable proliferative diabetic retinopathy, right eye: Secondary | ICD-10-CM | POA: Diagnosis not present

## 2020-02-06 DIAGNOSIS — E1142 Type 2 diabetes mellitus with diabetic polyneuropathy: Secondary | ICD-10-CM | POA: Diagnosis not present

## 2020-02-06 DIAGNOSIS — N400 Enlarged prostate without lower urinary tract symptoms: Secondary | ICD-10-CM | POA: Diagnosis not present

## 2020-02-06 DIAGNOSIS — E1151 Type 2 diabetes mellitus with diabetic peripheral angiopathy without gangrene: Secondary | ICD-10-CM | POA: Diagnosis not present

## 2020-02-06 DIAGNOSIS — D509 Iron deficiency anemia, unspecified: Secondary | ICD-10-CM | POA: Diagnosis not present

## 2020-02-06 DIAGNOSIS — I110 Hypertensive heart disease with heart failure: Secondary | ICD-10-CM | POA: Diagnosis not present

## 2020-02-06 DIAGNOSIS — N189 Chronic kidney disease, unspecified: Secondary | ICD-10-CM | POA: Diagnosis not present

## 2020-02-06 DIAGNOSIS — I739 Peripheral vascular disease, unspecified: Secondary | ICD-10-CM | POA: Diagnosis not present

## 2020-02-06 DIAGNOSIS — N179 Acute kidney failure, unspecified: Secondary | ICD-10-CM | POA: Diagnosis not present

## 2020-02-06 DIAGNOSIS — I129 Hypertensive chronic kidney disease with stage 1 through stage 4 chronic kidney disease, or unspecified chronic kidney disease: Secondary | ICD-10-CM | POA: Diagnosis not present

## 2020-02-06 DIAGNOSIS — E785 Hyperlipidemia, unspecified: Secondary | ICD-10-CM | POA: Diagnosis not present

## 2020-02-06 DIAGNOSIS — Z6821 Body mass index (BMI) 21.0-21.9, adult: Secondary | ICD-10-CM | POA: Diagnosis not present

## 2020-02-06 DIAGNOSIS — H409 Unspecified glaucoma: Secondary | ICD-10-CM | POA: Diagnosis not present

## 2020-02-13 DIAGNOSIS — S56921A Laceration of unspecified muscles, fascia and tendons at forearm level, right arm, initial encounter: Secondary | ICD-10-CM | POA: Diagnosis not present

## 2020-02-14 DIAGNOSIS — E211 Secondary hyperparathyroidism, not elsewhere classified: Secondary | ICD-10-CM | POA: Diagnosis not present

## 2020-02-14 DIAGNOSIS — E1122 Type 2 diabetes mellitus with diabetic chronic kidney disease: Secondary | ICD-10-CM | POA: Diagnosis not present

## 2020-02-14 DIAGNOSIS — N189 Chronic kidney disease, unspecified: Secondary | ICD-10-CM | POA: Diagnosis not present

## 2020-02-14 DIAGNOSIS — I129 Hypertensive chronic kidney disease with stage 1 through stage 4 chronic kidney disease, or unspecified chronic kidney disease: Secondary | ICD-10-CM | POA: Diagnosis not present

## 2020-02-14 DIAGNOSIS — D631 Anemia in chronic kidney disease: Secondary | ICD-10-CM | POA: Diagnosis not present

## 2020-02-28 ENCOUNTER — Other Ambulatory Visit (INDEPENDENT_AMBULATORY_CARE_PROVIDER_SITE_OTHER): Payer: Self-pay | Admitting: Ophthalmology

## 2020-03-01 DIAGNOSIS — Z23 Encounter for immunization: Secondary | ICD-10-CM | POA: Diagnosis not present

## 2020-03-31 DIAGNOSIS — E1122 Type 2 diabetes mellitus with diabetic chronic kidney disease: Secondary | ICD-10-CM | POA: Diagnosis not present

## 2020-03-31 DIAGNOSIS — N400 Enlarged prostate without lower urinary tract symptoms: Secondary | ICD-10-CM | POA: Diagnosis not present

## 2020-03-31 DIAGNOSIS — Z6821 Body mass index (BMI) 21.0-21.9, adult: Secondary | ICD-10-CM | POA: Diagnosis not present

## 2020-03-31 DIAGNOSIS — N184 Chronic kidney disease, stage 4 (severe): Secondary | ICD-10-CM | POA: Diagnosis not present

## 2020-03-31 DIAGNOSIS — I129 Hypertensive chronic kidney disease with stage 1 through stage 4 chronic kidney disease, or unspecified chronic kidney disease: Secondary | ICD-10-CM | POA: Diagnosis not present

## 2020-03-31 DIAGNOSIS — I10 Essential (primary) hypertension: Secondary | ICD-10-CM | POA: Diagnosis not present

## 2020-03-31 DIAGNOSIS — E1151 Type 2 diabetes mellitus with diabetic peripheral angiopathy without gangrene: Secondary | ICD-10-CM | POA: Diagnosis not present

## 2020-03-31 DIAGNOSIS — D509 Iron deficiency anemia, unspecified: Secondary | ICD-10-CM | POA: Diagnosis not present

## 2020-03-31 DIAGNOSIS — E785 Hyperlipidemia, unspecified: Secondary | ICD-10-CM | POA: Diagnosis not present

## 2020-03-31 DIAGNOSIS — H409 Unspecified glaucoma: Secondary | ICD-10-CM | POA: Diagnosis not present

## 2020-03-31 DIAGNOSIS — E1142 Type 2 diabetes mellitus with diabetic polyneuropathy: Secondary | ICD-10-CM | POA: Diagnosis not present

## 2020-03-31 DIAGNOSIS — Z89511 Acquired absence of right leg below knee: Secondary | ICD-10-CM | POA: Diagnosis not present

## 2020-03-31 DIAGNOSIS — I110 Hypertensive heart disease with heart failure: Secondary | ICD-10-CM | POA: Diagnosis not present

## 2020-03-31 DIAGNOSIS — I739 Peripheral vascular disease, unspecified: Secondary | ICD-10-CM | POA: Diagnosis not present

## 2020-03-31 DIAGNOSIS — N179 Acute kidney failure, unspecified: Secondary | ICD-10-CM | POA: Diagnosis not present

## 2020-03-31 DIAGNOSIS — E113551 Type 2 diabetes mellitus with stable proliferative diabetic retinopathy, right eye: Secondary | ICD-10-CM | POA: Diagnosis not present

## 2020-03-31 DIAGNOSIS — N189 Chronic kidney disease, unspecified: Secondary | ICD-10-CM | POA: Diagnosis not present

## 2020-04-08 DIAGNOSIS — E1140 Type 2 diabetes mellitus with diabetic neuropathy, unspecified: Secondary | ICD-10-CM | POA: Diagnosis not present

## 2020-04-08 DIAGNOSIS — E1151 Type 2 diabetes mellitus with diabetic peripheral angiopathy without gangrene: Secondary | ICD-10-CM | POA: Diagnosis not present

## 2020-04-12 ENCOUNTER — Other Ambulatory Visit (INDEPENDENT_AMBULATORY_CARE_PROVIDER_SITE_OTHER): Payer: Self-pay | Admitting: Ophthalmology

## 2020-04-25 DIAGNOSIS — E1122 Type 2 diabetes mellitus with diabetic chronic kidney disease: Secondary | ICD-10-CM | POA: Diagnosis not present

## 2020-04-25 DIAGNOSIS — Z89511 Acquired absence of right leg below knee: Secondary | ICD-10-CM | POA: Diagnosis not present

## 2020-04-25 DIAGNOSIS — E113551 Type 2 diabetes mellitus with stable proliferative diabetic retinopathy, right eye: Secondary | ICD-10-CM | POA: Diagnosis not present

## 2020-04-25 DIAGNOSIS — I10 Essential (primary) hypertension: Secondary | ICD-10-CM | POA: Diagnosis not present

## 2020-04-25 DIAGNOSIS — N184 Chronic kidney disease, stage 4 (severe): Secondary | ICD-10-CM | POA: Diagnosis not present

## 2020-05-05 DIAGNOSIS — L308 Other specified dermatitis: Secondary | ICD-10-CM | POA: Diagnosis not present

## 2020-05-21 DIAGNOSIS — N189 Chronic kidney disease, unspecified: Secondary | ICD-10-CM | POA: Diagnosis not present

## 2020-05-21 DIAGNOSIS — E1122 Type 2 diabetes mellitus with diabetic chronic kidney disease: Secondary | ICD-10-CM | POA: Diagnosis not present

## 2020-05-21 DIAGNOSIS — I160 Hypertensive urgency: Secondary | ICD-10-CM | POA: Diagnosis not present

## 2020-05-21 DIAGNOSIS — I129 Hypertensive chronic kidney disease with stage 1 through stage 4 chronic kidney disease, or unspecified chronic kidney disease: Secondary | ICD-10-CM | POA: Diagnosis not present

## 2020-05-21 DIAGNOSIS — R809 Proteinuria, unspecified: Secondary | ICD-10-CM | POA: Diagnosis not present

## 2020-05-21 DIAGNOSIS — E1129 Type 2 diabetes mellitus with other diabetic kidney complication: Secondary | ICD-10-CM | POA: Diagnosis not present

## 2020-05-21 DIAGNOSIS — E211 Secondary hyperparathyroidism, not elsewhere classified: Secondary | ICD-10-CM | POA: Diagnosis not present

## 2020-05-26 ENCOUNTER — Encounter (INDEPENDENT_AMBULATORY_CARE_PROVIDER_SITE_OTHER): Payer: Self-pay | Admitting: Ophthalmology

## 2020-05-26 ENCOUNTER — Other Ambulatory Visit: Payer: Self-pay

## 2020-05-26 ENCOUNTER — Ambulatory Visit (INDEPENDENT_AMBULATORY_CARE_PROVIDER_SITE_OTHER): Payer: Medicare Other | Admitting: Ophthalmology

## 2020-05-26 DIAGNOSIS — H35373 Puckering of macula, bilateral: Secondary | ICD-10-CM

## 2020-05-26 DIAGNOSIS — E113511 Type 2 diabetes mellitus with proliferative diabetic retinopathy with macular edema, right eye: Secondary | ICD-10-CM | POA: Diagnosis not present

## 2020-05-26 DIAGNOSIS — H4720 Unspecified optic atrophy: Secondary | ICD-10-CM

## 2020-05-26 DIAGNOSIS — H353131 Nonexudative age-related macular degeneration, bilateral, early dry stage: Secondary | ICD-10-CM

## 2020-05-26 DIAGNOSIS — E113552 Type 2 diabetes mellitus with stable proliferative diabetic retinopathy, left eye: Secondary | ICD-10-CM

## 2020-05-26 NOTE — Assessment & Plan Note (Signed)
Stable relative to nonperfusion

## 2020-05-26 NOTE — Assessment & Plan Note (Signed)
No active disease, observe

## 2020-05-26 NOTE — Progress Notes (Signed)
05/26/2020     CHIEF COMPLAINT Patient presents for Retina Follow Up (6 MO FU OU///Pt reports stable vision OU. Pt denies any new F/F, pain, or pressure OU. ///Last A1C: unsure///Last BS: 113 this AM.)   HISTORY OF PRESENT ILLNESS: Riley Griffith is a 87 y.o. male who presents to the clinic today for:   HPI    Retina Follow Up    Patient presents with  Diabetic Retinopathy.  In both eyes.  This started 6 months ago.  Duration of 6 months.  Since onset it is stable. Additional comments: 6 MO FU OU   Pt reports stable vision OU. Pt denies any new F/F, pain, or pressure OU.    Last A1C: unsure   Last BS: 113 this AM.       Last edited by Nichola Sizer D on 05/26/2020 10:39 AM. (History)      Referring physician: [name], MD [place],  [place] [address]  HISTORICAL INFORMATION:   Selected notes from the MEDICAL RECORD NUMBER       CURRENT MEDICATIONS: Current Outpatient Medications (Ophthalmic Drugs)  Medication Sig  . brimonidine (ALPHAGAN P) 0.1 % SOLN Place 1 drop into the left eye 2 (two) times daily.  Marland Kitchen LUMIGAN 0.01 % SOLN USE 1 DROP TO LEFT EYE AT BEDTIME.   No current facility-administered medications for this visit. (Ophthalmic Drugs)   Current Outpatient Medications (Other)  Medication Sig  . acetaminophen (TYLENOL) 325 MG tablet Take 2 tablets (650 mg total) by mouth every 6 (six) hours as needed for mild pain, fever or headache (or Fever >/= 101).  . calcitRIOL (ROCALTROL) 0.25 MCG capsule Take 0.25 mcg by mouth daily.  . carvedilol (COREG) 3.125 MG tablet Take 1 tablet (3.125 mg total) by mouth 2 (two) times daily with a meal.  . cholecalciferol (VITAMIN D) 1000 UNITS tablet Take 1,000 Units by mouth daily.    Marland Kitchen co-enzyme Q-10 30 MG capsule Take 30 mg by mouth daily.    . ferrous sulfate 325 (65 FE) MG tablet Take 325 mg by mouth daily with breakfast.  . furosemide (LASIX) 20 MG tablet Take 20 mg by mouth daily. For swelling   .  insulin glargine (LANTUS) 100 UNIT/ML injection Inject 10 Units into the skin at bedtime.   Marland Kitchen lisinopril (PRINIVIL,ZESTRIL) 10 MG tablet Take 10 mg by mouth daily.    . ondansetron (ZOFRAN ODT) 4 MG disintegrating tablet Take 1 tablet (4 mg total) by mouth every 8 (eight) hours as needed for nausea or vomiting.  . sodium bicarbonate 650 MG tablet Take 650 mg by mouth 2 (two) times daily.  . Tamsulosin HCl (FLOMAX) 0.4 MG CAPS Take 0.4 mg by mouth daily after supper.    . vitamin C (ASCORBIC ACID) 500 MG tablet Take 500 mg by mouth daily.     No current facility-administered medications for this visit. (Other)      REVIEW OF SYSTEMS:    ALLERGIES No Known Allergies  PAST MEDICAL HISTORY Past Medical History:  Diagnosis Date  . Diabetes mellitus   . GERD (gastroesophageal reflux disease)   . Hypertension   . Osteomyelitis of foot ([place])   . Renal disorder    see Dr Tawni Carnes   Past Surgical History:  Procedure Laterality Date  . AMPUTATION  01/25/2012   Procedure: AMPUTATION BELOW KNEE;  Surgeon: Rozanna Box, MD;  Location: [facility];  Service: Orthopedics;  Laterality: Right;  REVISION OF  BELOW KNEE  AMPUTATION   . APPLICATION OF WOUND VAC  01/25/2012   Procedure: APPLICATION OF WOUND VAC;  Surgeon: Rozanna Box, MD;  Location: [facility];  Service: Orthopedics;  Laterality: N/A;  AND PLACEMENT OF WOUND VAC   . LEG AMPUTATION BELOW KNEE     rt knee    FAMILY HISTORY History reviewed. No pertinent family history.  SOCIAL HISTORY Social History   Tobacco Use  . Smoking status: Never Smoker  . Smokeless tobacco: Never Used  Substance Use Topics  . Alcohol use: No  . Drug use: No         OPHTHALMIC EXAM: Base Eye Exam    Visual Acuity (ETDRS)      Right Left   Dist [place] 20/40 -2 20/100 +1   Dist ph [place]  NI       Tonometry (Tonopen, 10:44 AM)      Right Left   Pressure 22 26       Pupils      Pupils Dark Light Shape React APD   Right PERRL 2 2 Round Minimal  None   Left PERRL 2 2 Round Minimal None       Visual Fields (Counting fingers)      Left Right   Restrictions Partial outer superior nasal deficiency Partial outer superior nasal deficiency       Extraocular Movement      Right Left    Full Full       Neuro/Psych    Oriented x3: Yes   Mood/Affect: Normal       Dilation    Both eyes: 1.0% Mydriacyl, 2.5% Phenylephrine @ 10:44 AM        Slit Lamp and Fundus Exam    External Exam      Right Left   External Normal Normal       Slit Lamp Exam      Right Left   Lids/Lashes Normal Normal   Conjunctiva/Sclera White and quiet White and quiet   Cornea Clear Clear   Anterior Chamber Deep and quiet Deep and quiet   Iris Round and reactive Round and reactive   Lens Posterior chamber intraocular lens Posterior chamber intraocular lens   Anterior Vitreous Normal Normal       Fundus Exam      Right Left   Posterior Vitreous Posterior vitreous detachment Posterior vitreous detachment   Disc 1+ Pallor 1+ Pallor   C/D Ratio 0.55 0.7   Macula no macular thickening, Focal laser scars, no exudates, Microaneurysms no macular thickening, Focal laser scars, no exudates, Microaneurysms   Vessels PDR-quiet PDR-quiet   Periphery Good PRP  Good PRP           IMAGING AND PROCEDURES  Imaging and Procedures for 05/26/20  Color Fundus Photography Optos - OU - Both Eyes       Right Eye Progression has been stable. Macula : microaneurysms.   Left Eye Progression has been stable. Disc findings include normal observations. Macula : microaneurysms.   Notes OD, Good PRP 360 now PDR quiet  No large regions of nonperfusion nor untreated.  OS, clear media, good focal, good PRP, PDR quiet                  ASSESSMENT/PLAN:  Optic disc atrophy, bilateral Stable relative to nonperfusion  Diabetic macular edema of right eye with proliferative retinopathy associated with type 2 diabetes mellitus (HCC) No active disease,  observe  Bilateral epiretinal membrane  Minor no impact on acuity      ICD-10-CM   1. Diabetic macular edema of right eye with proliferative retinopathy associated with type 2 diabetes mellitus (HCC)  T05.6979 Color Fundus Photography Optos - OU - Both Eyes  2. Stable treated proliferative diabetic retinopathy of left eye determined by examination associated with type 2 diabetes mellitus ([place])  Y80.1655 Color Fundus Photography Optos - OU - Both Eyes  3. Early stage nonexudative age-related macular degeneration of both eyes  H35.3131   4. Optic disc atrophy, bilateral  H47.20   5. Bilateral epiretinal membrane  H35.373     1.  Patient instructed to continue use topical Lumigan nightly OU 1 drop  2.  Instructed to continue to use Alphagan 1 drop twice daily OU to enhance optic nerve perfusion limited by previous vascular disease.  3.  Ophthalmic Meds Ordered this visit:  No orders of the defined types were placed in this encounter.      Return in about 6 months (around 11/23/2020) for DILATE OU, OCT, COLOR FP.  There are no Patient Instructions on file for this visit.   Explained the diagnoses, plan, and follow up with the patient and they expressed understanding.  Patient expressed understanding of the importance of proper follow up care.   Clent Demark Reed Eifert M.D. Diseases & Surgery of the Retina and Vitreous Retina & Diabetic [place] 05/26/20     Abbreviations: M myopia (nearsighted); A astigmatism; H hyperopia (farsighted); P presbyopia; Mrx spectacle prescription;  CTL contact lenses; OD right eye; OS left eye; OU both eyes  XT exotropia; ET esotropia; PEK punctate epithelial keratitis; PEE punctate epithelial erosions; DES dry eye syndrome; MGD meibomian gland dysfunction; ATs artificial tears; PFAT's preservative free artificial tears; [place] nuclear sclerotic cataract; PSC posterior subcapsular cataract; ERM epi-retinal membrane; PVD posterior vitreous detachment; RD retinal  detachment; DM diabetes mellitus; DR diabetic retinopathy; NPDR non-proliferative diabetic retinopathy; PDR proliferative diabetic retinopathy; CSME clinically significant macular edema; DME diabetic macular edema; dbh dot blot hemorrhages; CWS cotton wool spot; POAG primary open angle glaucoma; C/D cup-to-disc ratio; HVF humphrey visual field; GVF goldmann visual field; OCT optical coherence tomography; IOP intraocular pressure; BRVO Branch retinal vein occlusion; CRVO central retinal vein occlusion; CRAO central retinal artery occlusion; BRAO branch retinal artery occlusion; RT retinal tear; SB scleral buckle; PPV pars plana vitrectomy; VH Vitreous hemorrhage; PRP panretinal laser photocoagulation; IVK intravitreal kenalog; VMT vitreomacular traction; MH Macular hole;  NVD neovascularization of the disc; NVE neovascularization elsewhere; AREDS age related eye disease study; ARMD age related macular degeneration; POAG primary open angle glaucoma; EBMD epithelial/anterior basement membrane dystrophy; ACIOL anterior chamber intraocular lens; IOL intraocular lens; PCIOL posterior chamber intraocular lens; Phaco/IOL phacoemulsification with intraocular lens placement; [place] photorefractive keratectomy; LASIK laser assisted in situ keratomileusis; HTN hypertension; DM diabetes mellitus; COPD chronic obstructive pulmonary disease

## 2020-05-26 NOTE — Assessment & Plan Note (Signed)
Minor no impact on acuity 

## 2020-06-18 ENCOUNTER — Other Ambulatory Visit (INDEPENDENT_AMBULATORY_CARE_PROVIDER_SITE_OTHER): Payer: Self-pay | Admitting: Ophthalmology

## 2020-06-26 DIAGNOSIS — L258 Unspecified contact dermatitis due to other agents: Secondary | ICD-10-CM | POA: Diagnosis not present

## 2020-06-26 DIAGNOSIS — E11622 Type 2 diabetes mellitus with other skin ulcer: Secondary | ICD-10-CM | POA: Diagnosis not present

## 2020-07-15 DIAGNOSIS — E785 Hyperlipidemia, unspecified: Secondary | ICD-10-CM | POA: Diagnosis not present

## 2020-07-15 DIAGNOSIS — D509 Iron deficiency anemia, unspecified: Secondary | ICD-10-CM | POA: Diagnosis not present

## 2020-07-15 DIAGNOSIS — N179 Acute kidney failure, unspecified: Secondary | ICD-10-CM | POA: Diagnosis not present

## 2020-07-15 DIAGNOSIS — E11319 Type 2 diabetes mellitus with unspecified diabetic retinopathy without macular edema: Secondary | ICD-10-CM | POA: Diagnosis not present

## 2020-07-15 DIAGNOSIS — E1122 Type 2 diabetes mellitus with diabetic chronic kidney disease: Secondary | ICD-10-CM | POA: Diagnosis not present

## 2020-07-15 DIAGNOSIS — I110 Hypertensive heart disease with heart failure: Secondary | ICD-10-CM | POA: Diagnosis not present

## 2020-07-15 DIAGNOSIS — L8992 Pressure ulcer of unspecified site, stage 2: Secondary | ICD-10-CM | POA: Diagnosis not present

## 2020-07-15 DIAGNOSIS — Z6821 Body mass index (BMI) 21.0-21.9, adult: Secondary | ICD-10-CM | POA: Diagnosis not present

## 2020-07-15 DIAGNOSIS — I129 Hypertensive chronic kidney disease with stage 1 through stage 4 chronic kidney disease, or unspecified chronic kidney disease: Secondary | ICD-10-CM | POA: Diagnosis not present

## 2020-07-15 DIAGNOSIS — E1151 Type 2 diabetes mellitus with diabetic peripheral angiopathy without gangrene: Secondary | ICD-10-CM | POA: Diagnosis not present

## 2020-07-15 DIAGNOSIS — I739 Peripheral vascular disease, unspecified: Secondary | ICD-10-CM | POA: Diagnosis not present

## 2020-07-15 DIAGNOSIS — N189 Chronic kidney disease, unspecified: Secondary | ICD-10-CM | POA: Diagnosis not present

## 2020-07-15 DIAGNOSIS — E1142 Type 2 diabetes mellitus with diabetic polyneuropathy: Secondary | ICD-10-CM | POA: Diagnosis not present

## 2020-07-15 DIAGNOSIS — Z89511 Acquired absence of right leg below knee: Secondary | ICD-10-CM | POA: Diagnosis not present

## 2020-07-15 DIAGNOSIS — N400 Enlarged prostate without lower urinary tract symptoms: Secondary | ICD-10-CM | POA: Diagnosis not present

## 2020-07-21 DIAGNOSIS — N184 Chronic kidney disease, stage 4 (severe): Secondary | ICD-10-CM | POA: Diagnosis not present

## 2020-07-21 DIAGNOSIS — L89899 Pressure ulcer of other site, unspecified stage: Secondary | ICD-10-CM | POA: Diagnosis not present

## 2020-07-21 DIAGNOSIS — I10 Essential (primary) hypertension: Secondary | ICD-10-CM | POA: Diagnosis not present

## 2020-07-21 DIAGNOSIS — E1122 Type 2 diabetes mellitus with diabetic chronic kidney disease: Secondary | ICD-10-CM | POA: Diagnosis not present

## 2020-07-21 DIAGNOSIS — Z89511 Acquired absence of right leg below knee: Secondary | ICD-10-CM | POA: Diagnosis not present

## 2020-07-21 DIAGNOSIS — E782 Mixed hyperlipidemia: Secondary | ICD-10-CM | POA: Diagnosis not present

## 2020-07-31 DIAGNOSIS — E1165 Type 2 diabetes mellitus with hyperglycemia: Secondary | ICD-10-CM | POA: Diagnosis not present

## 2020-08-05 DIAGNOSIS — L97909 Non-pressure chronic ulcer of unspecified part of unspecified lower leg with unspecified severity: Secondary | ICD-10-CM | POA: Diagnosis not present

## 2020-08-05 DIAGNOSIS — Z89511 Acquired absence of right leg below knee: Secondary | ICD-10-CM | POA: Diagnosis not present

## 2020-08-05 DIAGNOSIS — Z794 Long term (current) use of insulin: Secondary | ICD-10-CM | POA: Diagnosis not present

## 2020-08-05 DIAGNOSIS — L89892 Pressure ulcer of other site, stage 2: Secondary | ICD-10-CM | POA: Diagnosis not present

## 2020-08-05 DIAGNOSIS — S0001XA Abrasion of scalp, initial encounter: Secondary | ICD-10-CM | POA: Diagnosis not present

## 2020-08-05 DIAGNOSIS — Z7982 Long term (current) use of aspirin: Secondary | ICD-10-CM | POA: Diagnosis not present

## 2020-08-05 DIAGNOSIS — Z79899 Other long term (current) drug therapy: Secondary | ICD-10-CM | POA: Diagnosis not present

## 2020-08-05 DIAGNOSIS — I509 Heart failure, unspecified: Secondary | ICD-10-CM | POA: Diagnosis not present

## 2020-08-05 DIAGNOSIS — I130 Hypertensive heart and chronic kidney disease with heart failure and stage 1 through stage 4 chronic kidney disease, or unspecified chronic kidney disease: Secondary | ICD-10-CM | POA: Diagnosis not present

## 2020-08-05 DIAGNOSIS — E1122 Type 2 diabetes mellitus with diabetic chronic kidney disease: Secondary | ICD-10-CM | POA: Diagnosis not present

## 2020-08-05 DIAGNOSIS — N189 Chronic kidney disease, unspecified: Secondary | ICD-10-CM | POA: Diagnosis not present

## 2020-08-05 DIAGNOSIS — Z4800 Encounter for change or removal of nonsurgical wound dressing: Secondary | ICD-10-CM | POA: Diagnosis not present

## 2020-08-07 DIAGNOSIS — N400 Enlarged prostate without lower urinary tract symptoms: Secondary | ICD-10-CM | POA: Diagnosis not present

## 2020-08-07 DIAGNOSIS — L89892 Pressure ulcer of other site, stage 2: Secondary | ICD-10-CM | POA: Diagnosis not present

## 2020-08-07 DIAGNOSIS — S0001XD Abrasion of scalp, subsequent encounter: Secondary | ICD-10-CM | POA: Diagnosis not present

## 2020-08-07 DIAGNOSIS — E1122 Type 2 diabetes mellitus with diabetic chronic kidney disease: Secondary | ICD-10-CM | POA: Diagnosis not present

## 2020-08-07 DIAGNOSIS — Z794 Long term (current) use of insulin: Secondary | ICD-10-CM | POA: Diagnosis not present

## 2020-08-07 DIAGNOSIS — F0390 Unspecified dementia without behavioral disturbance: Secondary | ICD-10-CM | POA: Diagnosis not present

## 2020-08-07 DIAGNOSIS — Z89511 Acquired absence of right leg below knee: Secondary | ICD-10-CM | POA: Diagnosis not present

## 2020-08-07 DIAGNOSIS — I110 Hypertensive heart disease with heart failure: Secondary | ICD-10-CM | POA: Diagnosis not present

## 2020-08-07 DIAGNOSIS — Z9181 History of falling: Secondary | ICD-10-CM | POA: Diagnosis not present

## 2020-08-07 DIAGNOSIS — Z7982 Long term (current) use of aspirin: Secondary | ICD-10-CM | POA: Diagnosis not present

## 2020-08-07 DIAGNOSIS — N189 Chronic kidney disease, unspecified: Secondary | ICD-10-CM | POA: Diagnosis not present

## 2020-08-07 DIAGNOSIS — I509 Heart failure, unspecified: Secondary | ICD-10-CM | POA: Diagnosis not present

## 2020-08-19 DIAGNOSIS — L89892 Pressure ulcer of other site, stage 2: Secondary | ICD-10-CM | POA: Diagnosis not present

## 2020-08-19 DIAGNOSIS — S0001XD Abrasion of scalp, subsequent encounter: Secondary | ICD-10-CM | POA: Diagnosis not present

## 2020-08-19 DIAGNOSIS — T8789 Other complications of amputation stump: Secondary | ICD-10-CM | POA: Diagnosis not present

## 2020-08-24 DIAGNOSIS — Z9181 History of falling: Secondary | ICD-10-CM | POA: Diagnosis not present

## 2020-08-24 DIAGNOSIS — I509 Heart failure, unspecified: Secondary | ICD-10-CM | POA: Diagnosis not present

## 2020-08-24 DIAGNOSIS — L89892 Pressure ulcer of other site, stage 2: Secondary | ICD-10-CM | POA: Diagnosis not present

## 2020-08-24 DIAGNOSIS — N189 Chronic kidney disease, unspecified: Secondary | ICD-10-CM | POA: Diagnosis not present

## 2020-08-24 DIAGNOSIS — F0390 Unspecified dementia without behavioral disturbance: Secondary | ICD-10-CM | POA: Diagnosis not present

## 2020-08-24 DIAGNOSIS — N400 Enlarged prostate without lower urinary tract symptoms: Secondary | ICD-10-CM | POA: Diagnosis not present

## 2020-08-24 DIAGNOSIS — I110 Hypertensive heart disease with heart failure: Secondary | ICD-10-CM | POA: Diagnosis not present

## 2020-08-24 DIAGNOSIS — Z89511 Acquired absence of right leg below knee: Secondary | ICD-10-CM | POA: Diagnosis not present

## 2020-08-24 DIAGNOSIS — Z794 Long term (current) use of insulin: Secondary | ICD-10-CM | POA: Diagnosis not present

## 2020-08-24 DIAGNOSIS — Z7982 Long term (current) use of aspirin: Secondary | ICD-10-CM | POA: Diagnosis not present

## 2020-08-24 DIAGNOSIS — E1122 Type 2 diabetes mellitus with diabetic chronic kidney disease: Secondary | ICD-10-CM | POA: Diagnosis not present

## 2020-08-24 DIAGNOSIS — S0001XD Abrasion of scalp, subsequent encounter: Secondary | ICD-10-CM | POA: Diagnosis not present

## 2020-09-10 DIAGNOSIS — E211 Secondary hyperparathyroidism, not elsewhere classified: Secondary | ICD-10-CM | POA: Diagnosis not present

## 2020-09-10 DIAGNOSIS — E1129 Type 2 diabetes mellitus with other diabetic kidney complication: Secondary | ICD-10-CM | POA: Diagnosis not present

## 2020-09-10 DIAGNOSIS — D631 Anemia in chronic kidney disease: Secondary | ICD-10-CM | POA: Diagnosis not present

## 2020-09-10 DIAGNOSIS — E1122 Type 2 diabetes mellitus with diabetic chronic kidney disease: Secondary | ICD-10-CM | POA: Diagnosis not present

## 2020-09-10 DIAGNOSIS — I129 Hypertensive chronic kidney disease with stage 1 through stage 4 chronic kidney disease, or unspecified chronic kidney disease: Secondary | ICD-10-CM | POA: Diagnosis not present

## 2020-09-10 DIAGNOSIS — R809 Proteinuria, unspecified: Secondary | ICD-10-CM | POA: Diagnosis not present

## 2020-09-10 DIAGNOSIS — N189 Chronic kidney disease, unspecified: Secondary | ICD-10-CM | POA: Diagnosis not present

## 2020-09-11 DIAGNOSIS — E1165 Type 2 diabetes mellitus with hyperglycemia: Secondary | ICD-10-CM | POA: Diagnosis not present

## 2020-09-16 DIAGNOSIS — I509 Heart failure, unspecified: Secondary | ICD-10-CM | POA: Diagnosis not present

## 2020-09-16 DIAGNOSIS — Z79899 Other long term (current) drug therapy: Secondary | ICD-10-CM | POA: Diagnosis not present

## 2020-09-16 DIAGNOSIS — Z7982 Long term (current) use of aspirin: Secondary | ICD-10-CM | POA: Diagnosis not present

## 2020-09-16 DIAGNOSIS — R21 Rash and other nonspecific skin eruption: Secondary | ICD-10-CM | POA: Diagnosis not present

## 2020-09-16 DIAGNOSIS — Z89511 Acquired absence of right leg below knee: Secondary | ICD-10-CM | POA: Diagnosis not present

## 2020-09-16 DIAGNOSIS — E11622 Type 2 diabetes mellitus with other skin ulcer: Secondary | ICD-10-CM | POA: Diagnosis not present

## 2020-09-16 DIAGNOSIS — Z9713 Presence of artificial right leg (complete) (partial): Secondary | ICD-10-CM | POA: Diagnosis not present

## 2020-09-16 DIAGNOSIS — L89892 Pressure ulcer of other site, stage 2: Secondary | ICD-10-CM | POA: Diagnosis not present

## 2020-09-16 DIAGNOSIS — I130 Hypertensive heart and chronic kidney disease with heart failure and stage 1 through stage 4 chronic kidney disease, or unspecified chronic kidney disease: Secondary | ICD-10-CM | POA: Diagnosis not present

## 2020-09-16 DIAGNOSIS — N189 Chronic kidney disease, unspecified: Secondary | ICD-10-CM | POA: Diagnosis not present

## 2020-09-16 DIAGNOSIS — E1122 Type 2 diabetes mellitus with diabetic chronic kidney disease: Secondary | ICD-10-CM | POA: Diagnosis not present

## 2020-09-16 DIAGNOSIS — T8789 Other complications of amputation stump: Secondary | ICD-10-CM | POA: Diagnosis not present

## 2020-10-14 DIAGNOSIS — I509 Heart failure, unspecified: Secondary | ICD-10-CM | POA: Diagnosis not present

## 2020-10-14 DIAGNOSIS — T8789 Other complications of amputation stump: Secondary | ICD-10-CM | POA: Diagnosis not present

## 2020-10-14 DIAGNOSIS — L2489 Irritant contact dermatitis due to other agents: Secondary | ICD-10-CM | POA: Diagnosis not present

## 2020-10-14 DIAGNOSIS — Z79899 Other long term (current) drug therapy: Secondary | ICD-10-CM | POA: Diagnosis not present

## 2020-10-14 DIAGNOSIS — N189 Chronic kidney disease, unspecified: Secondary | ICD-10-CM | POA: Diagnosis not present

## 2020-10-14 DIAGNOSIS — L89892 Pressure ulcer of other site, stage 2: Secondary | ICD-10-CM | POA: Diagnosis not present

## 2020-10-14 DIAGNOSIS — I130 Hypertensive heart and chronic kidney disease with heart failure and stage 1 through stage 4 chronic kidney disease, or unspecified chronic kidney disease: Secondary | ICD-10-CM | POA: Diagnosis not present

## 2020-10-14 DIAGNOSIS — E1122 Type 2 diabetes mellitus with diabetic chronic kidney disease: Secondary | ICD-10-CM | POA: Diagnosis not present

## 2020-10-14 DIAGNOSIS — Z7982 Long term (current) use of aspirin: Secondary | ICD-10-CM | POA: Diagnosis not present

## 2020-10-22 DIAGNOSIS — E1169 Type 2 diabetes mellitus with other specified complication: Secondary | ICD-10-CM | POA: Diagnosis not present

## 2020-10-22 DIAGNOSIS — E611 Iron deficiency: Secondary | ICD-10-CM | POA: Diagnosis not present

## 2020-10-22 DIAGNOSIS — I110 Hypertensive heart disease with heart failure: Secondary | ICD-10-CM | POA: Diagnosis not present

## 2020-10-28 DIAGNOSIS — F5104 Psychophysiologic insomnia: Secondary | ICD-10-CM | POA: Diagnosis not present

## 2020-10-28 DIAGNOSIS — Z89519 Acquired absence of unspecified leg below knee: Secondary | ICD-10-CM | POA: Diagnosis not present

## 2020-10-28 DIAGNOSIS — N184 Chronic kidney disease, stage 4 (severe): Secondary | ICD-10-CM | POA: Diagnosis not present

## 2020-10-28 DIAGNOSIS — E1122 Type 2 diabetes mellitus with diabetic chronic kidney disease: Secondary | ICD-10-CM | POA: Diagnosis not present

## 2020-10-28 DIAGNOSIS — N400 Enlarged prostate without lower urinary tract symptoms: Secondary | ICD-10-CM | POA: Diagnosis not present

## 2020-10-28 DIAGNOSIS — D631 Anemia in chronic kidney disease: Secondary | ICD-10-CM | POA: Diagnosis not present

## 2020-10-28 DIAGNOSIS — E782 Mixed hyperlipidemia: Secondary | ICD-10-CM | POA: Diagnosis not present

## 2020-10-28 DIAGNOSIS — I10 Essential (primary) hypertension: Secondary | ICD-10-CM | POA: Diagnosis not present

## 2020-10-28 DIAGNOSIS — E1140 Type 2 diabetes mellitus with diabetic neuropathy, unspecified: Secondary | ICD-10-CM | POA: Diagnosis not present

## 2020-11-20 DIAGNOSIS — L308 Other specified dermatitis: Secondary | ICD-10-CM | POA: Diagnosis not present

## 2020-11-20 DIAGNOSIS — E11622 Type 2 diabetes mellitus with other skin ulcer: Secondary | ICD-10-CM | POA: Diagnosis not present

## 2020-11-24 ENCOUNTER — Encounter (INDEPENDENT_AMBULATORY_CARE_PROVIDER_SITE_OTHER): Payer: HMO | Admitting: Ophthalmology

## 2020-12-01 DIAGNOSIS — C44612 Basal cell carcinoma of skin of right upper limb, including shoulder: Secondary | ICD-10-CM | POA: Diagnosis not present

## 2020-12-01 DIAGNOSIS — L570 Actinic keratosis: Secondary | ICD-10-CM | POA: Diagnosis not present

## 2020-12-01 DIAGNOSIS — X32XXXD Exposure to sunlight, subsequent encounter: Secondary | ICD-10-CM | POA: Diagnosis not present

## 2020-12-01 DIAGNOSIS — D225 Melanocytic nevi of trunk: Secondary | ICD-10-CM | POA: Diagnosis not present

## 2020-12-25 DIAGNOSIS — R809 Proteinuria, unspecified: Secondary | ICD-10-CM | POA: Diagnosis not present

## 2020-12-25 DIAGNOSIS — E1129 Type 2 diabetes mellitus with other diabetic kidney complication: Secondary | ICD-10-CM | POA: Diagnosis not present

## 2020-12-25 DIAGNOSIS — D631 Anemia in chronic kidney disease: Secondary | ICD-10-CM | POA: Diagnosis not present

## 2020-12-25 DIAGNOSIS — I129 Hypertensive chronic kidney disease with stage 1 through stage 4 chronic kidney disease, or unspecified chronic kidney disease: Secondary | ICD-10-CM | POA: Diagnosis not present

## 2020-12-25 DIAGNOSIS — E1122 Type 2 diabetes mellitus with diabetic chronic kidney disease: Secondary | ICD-10-CM | POA: Diagnosis not present

## 2020-12-25 DIAGNOSIS — N189 Chronic kidney disease, unspecified: Secondary | ICD-10-CM | POA: Diagnosis not present

## 2020-12-30 ENCOUNTER — Encounter (INDEPENDENT_AMBULATORY_CARE_PROVIDER_SITE_OTHER): Payer: Self-pay | Admitting: Ophthalmology

## 2020-12-30 ENCOUNTER — Other Ambulatory Visit: Payer: Self-pay

## 2020-12-30 ENCOUNTER — Ambulatory Visit (INDEPENDENT_AMBULATORY_CARE_PROVIDER_SITE_OTHER): Payer: HMO | Admitting: Ophthalmology

## 2020-12-30 DIAGNOSIS — E113552 Type 2 diabetes mellitus with stable proliferative diabetic retinopathy, left eye: Secondary | ICD-10-CM

## 2020-12-30 DIAGNOSIS — H401132 Primary open-angle glaucoma, bilateral, moderate stage: Secondary | ICD-10-CM | POA: Diagnosis not present

## 2020-12-30 DIAGNOSIS — H40013 Open angle with borderline findings, low risk, bilateral: Secondary | ICD-10-CM | POA: Diagnosis not present

## 2020-12-30 DIAGNOSIS — E113511 Type 2 diabetes mellitus with proliferative diabetic retinopathy with macular edema, right eye: Secondary | ICD-10-CM | POA: Diagnosis not present

## 2020-12-30 MED ORDER — LATANOPROST 0.005 % OP SOLN: 1.0000 [drp] | Freq: Every day | OPHTHALMIC | 1 refills | Status: DC

## 2020-12-30 NOTE — Progress Notes (Signed)
12/30/2020     CHIEF COMPLAINT Patient presents for  Chief Complaint  Patient presents with   Retina Follow Up      HISTORY OF PRESENT ILLNESS: Riley Griffith is a 87 y.o. male who presents to the clinic today for:   HPI     Retina Follow Up   Patient presents with  Diabetic Retinopathy.  In both eyes.  This started 7 months ago.  Severity is mild.  Duration of 7 months.  Since onset it is stable.        Comments   7 month fu OU and OCT/FP Pt states VA OU stable since last visit. Pt denies FOL, floaters, or ocular pain OU.  A1C:unknown [id number] Pt reports that he is not using his Lumigan at night because "the pharmacy quit sending them." Pt states it has been about 6 months since he had any.       Last edited by Kendra Opitz, COA on 12/30/2020 10:15 AM.      Referring physician: [name], MD [place],  [place] [address]  HISTORICAL INFORMATION:   Selected notes from the MEDICAL RECORD NUMBER       CURRENT MEDICATIONS: Current Outpatient Medications (Ophthalmic Drugs)  Medication Sig   brimonidine (ALPHAGAN P) 0.1 % SOLN Place 1 drop into the left eye 2 (two) times daily.   LUMIGAN 0.01 % SOLN USE 1 DROP TO LEFT EYE AT BEDTIME.   No current facility-administered medications for this visit. (Ophthalmic Drugs)   Current Outpatient Medications (Other)  Medication Sig   acetaminophen (TYLENOL) 325 MG tablet Take 2 tablets (650 mg total) by mouth every 6 (six) hours as needed for mild pain, fever or headache (or Fever >/= 101).   calcitRIOL (ROCALTROL) 0.25 MCG capsule Take 0.25 mcg by mouth daily.   carvedilol (COREG) 3.125 MG tablet Take 1 tablet (3.125 mg total) by mouth 2 (two) times daily with a meal.   cholecalciferol (VITAMIN D) 1000 UNITS tablet Take 1,000 Units by mouth daily.     co-enzyme Q-10 30 MG capsule Take 30 mg by mouth daily.     ferrous sulfate 325 (65 FE) MG tablet Take 325 mg by mouth daily with breakfast.   furosemide  (LASIX) 20 MG tablet Take 20 mg by mouth daily. For swelling    insulin glargine (LANTUS) 100 UNIT/ML injection Inject 10 Units into the skin at bedtime.    lisinopril (PRINIVIL,ZESTRIL) 10 MG tablet Take 10 mg by mouth daily.     ondansetron (ZOFRAN ODT) 4 MG disintegrating tablet Take 1 tablet (4 mg total) by mouth every 8 (eight) hours as needed for nausea or vomiting.   sodium bicarbonate 650 MG tablet Take 650 mg by mouth 2 (two) times daily.   Tamsulosin HCl (FLOMAX) 0.4 MG CAPS Take 0.4 mg by mouth daily after supper.     vitamin C (ASCORBIC ACID) 500 MG tablet Take 500 mg by mouth daily.     No current facility-administered medications for this visit. (Other)      REVIEW OF SYSTEMS:    ALLERGIES No Known Allergies  PAST MEDICAL HISTORY Past Medical History:  Diagnosis Date   Diabetes mellitus    GERD (gastroesophageal reflux disease)    Hypertension    Osteomyelitis of foot ([place])    Renal disorder    see Dr Tawni Carnes   Past Surgical History:  Procedure Laterality Date   AMPUTATION  01/25/2012   Procedure: AMPUTATION  BELOW KNEE;  Surgeon: Rozanna Box, MD;  Location: [facility];  Service: Orthopedics;  Laterality: Right;  REVISION OF BELOW KNEE  AMPUTATION    APPLICATION OF WOUND VAC  01/25/2012   Procedure: APPLICATION OF WOUND VAC;  Surgeon: Rozanna Box, MD;  Location: [facility];  Service: Orthopedics;  Laterality: N/A;  AND PLACEMENT OF WOUND VAC    LEG AMPUTATION BELOW KNEE     rt knee    FAMILY HISTORY History reviewed. No pertinent family history.  SOCIAL HISTORY Social History   Tobacco Use   Smoking status: Never   Smokeless tobacco: Never  Substance Use Topics   Alcohol use: No   Drug use: No         OPHTHALMIC EXAM:  Base Eye Exam     Visual Acuity (ETDRS)       Right Left   Dist [place] 20/30 -2 20/200 -1   Dist ph [place] NI NI         Tonometry (Tonopen, 10:19 AM)       Right Left   Pressure 22 30         Tonometry #2 (Tonopen, 10:19  AM)       Right Left   Pressure  32         Pupils       Pupils Dark Light Shape React APD   Right PERRL 2 2 Round Minimal None   Left PERRL 2 2 Round Minimal None         Visual Fields       Left Right   Restrictions Partial outer superior nasal deficiency Total superior nasal deficiency         Extraocular Movement       Right Left    Full Full         Neuro/Psych     Oriented x3: Yes   Mood/Affect: Normal         Dilation     Both eyes: 1.0% Mydriacyl, 2.5% Phenylephrine @ 10:19 AM           Slit Lamp and Fundus Exam     External Exam       Right Left   External Normal Normal         Slit Lamp Exam       Right Left   Lids/Lashes Normal Normal   Conjunctiva/Sclera White and quiet White and quiet   Cornea Clear Clear   Anterior Chamber Deep and quiet Deep and quiet   Iris Round and reactive Round and reactive   Lens Posterior chamber intraocular lens Posterior chamber intraocular lens   Anterior Vitreous Normal Normal         Fundus Exam       Right Left   Posterior Vitreous Posterior vitreous detachment Posterior vitreous detachment   Disc 1+ Pallor 1+ Pallor   C/D Ratio 0.55 0.7   Macula no macular thickening, Focal laser scars, no exudates, Microaneurysms, Hard drusen, Early age related macular degeneration no macular thickening, Focal laser scars, no exudates, Microaneurysms, Hard drusen, Early age related macular degeneration   Vessels PDR-quiet PDR-quiet   Periphery Good PRP  Good PRP             IMAGING AND PROCEDURES  Imaging and Procedures for 12/30/20  OCT, Retina - OU - Both Eyes       Right Eye Quality was good. Scan locations included subfoveal. Central Foveal Thickness: 274. Findings include abnormal foveal contour.  Left Eye Quality was good. Scan locations included subfoveal. Central Foveal Thickness: 245. Findings include abnormal foveal contour.   Notes Diffuse macular atrophy OU,, with no  active maculopathy     Color Fundus Photography Optos - OU - Both Eyes       Right Eye Progression has been stable. Disc findings include increased cup to disc ratio. Macula : microaneurysms.   Left Eye Progression has been stable. Disc findings include increased cup to disc ratio. Macula : microaneurysms.   Notes OD, Good PRP 360 now PDR quiet, with room nasally for more PRP should disease activity return  No large regions of nonperfusion nor untreated.  OS, clear media, good focal, good PRP, PDR quiet               ASSESSMENT/PLAN:  Primary open angle glaucoma of both eyes, moderate stage We will commence with latanoprost (Lumigan not improved on current system pharmacy) 1 drop nightly OU and follow-up with new eye doctor [place] to his [place], with Dr. Okey Regal of my eye doctor recommended     ICD-10-CM   1. Diabetic macular edema of right eye with proliferative retinopathy associated with type 2 diabetes mellitus (HCC)  E11.3511 OCT, Retina - OU - Both Eyes    Color Fundus Photography Optos - OU - Both Eyes    2. Stable treated proliferative diabetic retinopathy of left eye determined by examination associated with type 2 diabetes mellitus ([place])  Z02.5852 OCT, Retina - OU - Both Eyes    Color Fundus Photography Optos - OU - Both Eyes    3. Open angle glaucoma cupping of optic discs, bilateral  H40.013     4. Primary open angle glaucoma of both eyes, moderate stage  H40.1132       1.  Quiescent proliferative diabetic retinopathy OU  2.  Elevated intraocular pressures due to patient running out of topical medications for glaucoma.  That is not managed here.  No previous prescriptions from here.  We will provide temporizing 1 bottle of latanoprost ordered the his local pharmacy while he has a chance to establish relationship with Dr. Okey Regal or similar eye doctor local to his hometown  3.  Ophthalmic Meds Ordered this visit:  No orders of the defined  types were placed in this encounter.      Return in about 7 months (around 07/30/2021) for DILATE OU, COLOR FP, OCT.  There are no Patient Instructions on file for this visit.   Explained the diagnoses, plan, and follow up with the patient and they expressed understanding.  Patient expressed understanding of the importance of proper follow up care.   Clent Demark Khira Cudmore M.D. Diseases & Surgery of the Retina and Vitreous Retina & Diabetic [place] 12/30/20     Abbreviations: M myopia (nearsighted); A astigmatism; H hyperopia (farsighted); P presbyopia; Mrx spectacle prescription;  CTL contact lenses; OD right eye; OS left eye; OU both eyes  XT exotropia; ET esotropia; PEK punctate epithelial keratitis; PEE punctate epithelial erosions; DES dry eye syndrome; MGD meibomian gland dysfunction; ATs artificial tears; PFAT's preservative free artificial tears; [place] nuclear sclerotic cataract; PSC posterior subcapsular cataract; ERM epi-retinal membrane; PVD posterior vitreous detachment; RD retinal detachment; DM diabetes mellitus; DR diabetic retinopathy; NPDR non-proliferative diabetic retinopathy; PDR proliferative diabetic retinopathy; CSME clinically significant macular edema; DME diabetic macular edema; dbh dot blot hemorrhages; CWS cotton wool spot; POAG primary open angle glaucoma; C/D cup-to-disc ratio; HVF humphrey visual field; GVF goldmann visual field; OCT  optical coherence tomography; IOP intraocular pressure; BRVO Branch retinal vein occlusion; CRVO central retinal vein occlusion; CRAO central retinal artery occlusion; BRAO branch retinal artery occlusion; RT retinal tear; SB scleral buckle; PPV pars plana vitrectomy; VH Vitreous hemorrhage; PRP panretinal laser photocoagulation; IVK intravitreal kenalog; VMT vitreomacular traction; MH Macular hole;  NVD neovascularization of the disc; NVE neovascularization elsewhere; AREDS age related eye disease study; ARMD age related macular degeneration;  POAG primary open angle glaucoma; EBMD epithelial/anterior basement membrane dystrophy; ACIOL anterior chamber intraocular lens; IOL intraocular lens; PCIOL posterior chamber intraocular lens; Phaco/IOL phacoemulsification with intraocular lens placement; [place] photorefractive keratectomy; LASIK laser assisted in situ keratomileusis; HTN hypertension; DM diabetes mellitus; COPD chronic obstructive pulmonary disease

## 2020-12-30 NOTE — Assessment & Plan Note (Signed)
We will commence with latanoprost (Lumigan not improved on current system pharmacy) 1 drop nightly OU and follow-up with new eye doctor local to his hometown, with Dr. Okey Regal of my eye doctor recommended

## 2021-01-07 DIAGNOSIS — E1165 Type 2 diabetes mellitus with hyperglycemia: Secondary | ICD-10-CM | POA: Diagnosis not present

## 2021-01-08 DIAGNOSIS — R809 Proteinuria, unspecified: Secondary | ICD-10-CM | POA: Diagnosis not present

## 2021-01-08 DIAGNOSIS — E1129 Type 2 diabetes mellitus with other diabetic kidney complication: Secondary | ICD-10-CM | POA: Diagnosis not present

## 2021-01-08 DIAGNOSIS — I129 Hypertensive chronic kidney disease with stage 1 through stage 4 chronic kidney disease, or unspecified chronic kidney disease: Secondary | ICD-10-CM | POA: Diagnosis not present

## 2021-01-08 DIAGNOSIS — E211 Secondary hyperparathyroidism, not elsewhere classified: Secondary | ICD-10-CM | POA: Diagnosis not present

## 2021-01-08 DIAGNOSIS — N189 Chronic kidney disease, unspecified: Secondary | ICD-10-CM | POA: Diagnosis not present

## 2021-01-08 DIAGNOSIS — E1122 Type 2 diabetes mellitus with diabetic chronic kidney disease: Secondary | ICD-10-CM | POA: Diagnosis not present

## 2021-01-22 DIAGNOSIS — Z08 Encounter for follow-up examination after completed treatment for malignant neoplasm: Secondary | ICD-10-CM | POA: Diagnosis not present

## 2021-01-22 DIAGNOSIS — Z85828 Personal history of other malignant neoplasm of skin: Secondary | ICD-10-CM | POA: Diagnosis not present

## 2021-01-22 DIAGNOSIS — X32XXXD Exposure to sunlight, subsequent encounter: Secondary | ICD-10-CM | POA: Diagnosis not present

## 2021-01-22 DIAGNOSIS — L570 Actinic keratosis: Secondary | ICD-10-CM | POA: Diagnosis not present

## 2021-02-04 DIAGNOSIS — E1165 Type 2 diabetes mellitus with hyperglycemia: Secondary | ICD-10-CM | POA: Diagnosis not present

## 2021-02-27 ENCOUNTER — Other Ambulatory Visit (INDEPENDENT_AMBULATORY_CARE_PROVIDER_SITE_OTHER): Payer: Self-pay | Admitting: Ophthalmology

## 2021-03-31 ENCOUNTER — Other Ambulatory Visit (INDEPENDENT_AMBULATORY_CARE_PROVIDER_SITE_OTHER): Payer: Self-pay | Admitting: Ophthalmology

## 2021-04-08 DIAGNOSIS — N184 Chronic kidney disease, stage 4 (severe): Secondary | ICD-10-CM | POA: Diagnosis not present

## 2021-04-09 DIAGNOSIS — E1165 Type 2 diabetes mellitus with hyperglycemia: Secondary | ICD-10-CM | POA: Diagnosis not present

## 2021-04-15 DIAGNOSIS — E1122 Type 2 diabetes mellitus with diabetic chronic kidney disease: Secondary | ICD-10-CM | POA: Diagnosis not present

## 2021-04-15 DIAGNOSIS — I129 Hypertensive chronic kidney disease with stage 1 through stage 4 chronic kidney disease, or unspecified chronic kidney disease: Secondary | ICD-10-CM | POA: Diagnosis not present

## 2021-04-15 DIAGNOSIS — E211 Secondary hyperparathyroidism, not elsewhere classified: Secondary | ICD-10-CM | POA: Diagnosis not present

## 2021-04-15 DIAGNOSIS — E1129 Type 2 diabetes mellitus with other diabetic kidney complication: Secondary | ICD-10-CM | POA: Diagnosis not present

## 2021-04-15 DIAGNOSIS — N189 Chronic kidney disease, unspecified: Secondary | ICD-10-CM | POA: Diagnosis not present

## 2021-04-15 DIAGNOSIS — R809 Proteinuria, unspecified: Secondary | ICD-10-CM | POA: Diagnosis not present

## 2021-04-29 DIAGNOSIS — E119 Type 2 diabetes mellitus without complications: Secondary | ICD-10-CM | POA: Diagnosis not present

## 2021-04-29 DIAGNOSIS — E785 Hyperlipidemia, unspecified: Secondary | ICD-10-CM | POA: Diagnosis not present

## 2021-04-29 DIAGNOSIS — N189 Chronic kidney disease, unspecified: Secondary | ICD-10-CM | POA: Diagnosis not present

## 2021-04-30 ENCOUNTER — Encounter (INDEPENDENT_AMBULATORY_CARE_PROVIDER_SITE_OTHER): Payer: Self-pay

## 2021-04-30 DIAGNOSIS — H524 Presbyopia: Secondary | ICD-10-CM | POA: Diagnosis not present

## 2021-04-30 DIAGNOSIS — H401131 Primary open-angle glaucoma, bilateral, mild stage: Secondary | ICD-10-CM | POA: Diagnosis not present

## 2021-04-30 DIAGNOSIS — E103553 Type 1 diabetes mellitus with stable proliferative diabetic retinopathy, bilateral: Secondary | ICD-10-CM | POA: Diagnosis not present

## 2021-04-30 DIAGNOSIS — Z961 Presence of intraocular lens: Secondary | ICD-10-CM | POA: Diagnosis not present

## 2021-05-05 DIAGNOSIS — I10 Essential (primary) hypertension: Secondary | ICD-10-CM | POA: Diagnosis not present

## 2021-05-05 DIAGNOSIS — N400 Enlarged prostate without lower urinary tract symptoms: Secondary | ICD-10-CM | POA: Diagnosis not present

## 2021-05-05 DIAGNOSIS — E1122 Type 2 diabetes mellitus with diabetic chronic kidney disease: Secondary | ICD-10-CM | POA: Diagnosis not present

## 2021-05-05 DIAGNOSIS — D631 Anemia in chronic kidney disease: Secondary | ICD-10-CM | POA: Diagnosis not present

## 2021-05-05 DIAGNOSIS — E782 Mixed hyperlipidemia: Secondary | ICD-10-CM | POA: Diagnosis not present

## 2021-05-05 DIAGNOSIS — R809 Proteinuria, unspecified: Secondary | ICD-10-CM | POA: Diagnosis not present

## 2021-05-05 DIAGNOSIS — N184 Chronic kidney disease, stage 4 (severe): Secondary | ICD-10-CM | POA: Diagnosis not present

## 2021-05-05 DIAGNOSIS — Z0001 Encounter for general adult medical examination with abnormal findings: Secondary | ICD-10-CM | POA: Diagnosis not present

## 2021-05-05 DIAGNOSIS — E1140 Type 2 diabetes mellitus with diabetic neuropathy, unspecified: Secondary | ICD-10-CM | POA: Diagnosis not present

## 2021-05-05 DIAGNOSIS — Z89519 Acquired absence of unspecified leg below knee: Secondary | ICD-10-CM | POA: Diagnosis not present

## 2021-05-05 DIAGNOSIS — F5104 Psychophysiologic insomnia: Secondary | ICD-10-CM | POA: Diagnosis not present

## 2021-06-25 DIAGNOSIS — E1165 Type 2 diabetes mellitus with hyperglycemia: Secondary | ICD-10-CM | POA: Diagnosis not present

## 2021-07-30 ENCOUNTER — Ambulatory Visit (INDEPENDENT_AMBULATORY_CARE_PROVIDER_SITE_OTHER): Payer: HMO | Admitting: Ophthalmology

## 2021-07-30 ENCOUNTER — Other Ambulatory Visit: Payer: Self-pay

## 2021-07-30 ENCOUNTER — Encounter (INDEPENDENT_AMBULATORY_CARE_PROVIDER_SITE_OTHER): Payer: Self-pay | Admitting: Ophthalmology

## 2021-07-30 DIAGNOSIS — E113551 Type 2 diabetes mellitus with stable proliferative diabetic retinopathy, right eye: Secondary | ICD-10-CM | POA: Diagnosis not present

## 2021-07-30 DIAGNOSIS — E113552 Type 2 diabetes mellitus with stable proliferative diabetic retinopathy, left eye: Secondary | ICD-10-CM

## 2021-07-30 DIAGNOSIS — E113511 Type 2 diabetes mellitus with proliferative diabetic retinopathy with macular edema, right eye: Secondary | ICD-10-CM

## 2021-07-30 DIAGNOSIS — H401132 Primary open-angle glaucoma, bilateral, moderate stage: Secondary | ICD-10-CM

## 2021-07-30 MED ORDER — LUMIGAN 0.01 % OP SOLN: OPHTHALMIC | 0 refills | Status: AC

## 2021-07-30 NOTE — Assessment & Plan Note (Signed)
Follow-up with general eye care Dr. Aram Beecham [facility] for continued intraocular pressure management and medication ?

## 2021-07-30 NOTE — Assessment & Plan Note (Signed)
Good PRP, media clear no active PDR ? 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

## 2021-07-30 NOTE — Progress Notes (Signed)
? ? ?07/30/2021 ? ?  ? ?CHIEF COMPLAINT ?Patient presents for  ?Chief Complaint  ?Patient presents with  ? Diabetic Retinopathy with Macular Edema  ? ? ? ? ?HISTORY OF PRESENT ILLNESS: ?Riley Griffith is a 88 y.o. male who presents to the clinic today for:  ? ?HPI   ?7 mos fu OU OCT, FP. ?Patient states vision is stable and unchanged since last visit. Denies any new floaters or FOL. ?Pt son states the patient is only using Lumigan OS QHS. ?No hospitalizations or surgeries since the last visit. ?LBS: Patient states it is 127. ?Last edited by Laurin Coder on 07/30/2021  9:33 AM.  ?  ? ? ?Referring physician: ?Okey Regal, [place] ?[place],  [place] [address] ? ?HISTORICAL INFORMATION:  ? ?Selected notes from the [facility] ?  ?   ? ?CURRENT MEDICATIONS: ?Current Outpatient Medications (Ophthalmic Drugs)  ?Medication Sig  ? bimatoprost (LUMIGAN) 0.01 % SOLN USE 1 DROP TO LEFT EYE AT BEDTIME.  ? brimonidine (ALPHAGAN P) 0.1 % SOLN Place 1 drop into the left eye 2 (two) times daily.  ? latanoprost (XALATAN) 0.005 % ophthalmic solution PLACE 1 DROP INTO BOTH EYES DAILY.  ? ?No current facility-administered medications for this visit. (Ophthalmic Drugs)  ? ?Current Outpatient Medications (Other)  ?Medication Sig  ? acetaminophen (TYLENOL) 325 MG tablet Take 2 tablets (650 mg total) by mouth every 6 (six) hours as needed for mild pain, fever or headache (or Fever >/= 101).  ? calcitRIOL (ROCALTROL) 0.25 MCG capsule Take 0.25 mcg by mouth daily.  ? carvedilol (COREG) 3.125 MG tablet Take 1 tablet (3.125 mg total) by mouth 2 (two) times daily with a meal.  ? cholecalciferol (VITAMIN D) 1000 UNITS tablet Take 1,000 Units by mouth daily.    ? co-enzyme Q-10 30 MG capsule Take 30 mg by mouth daily.    ? ferrous sulfate 325 (65 FE) MG tablet Take 325 mg by mouth daily with breakfast.  ? furosemide (LASIX) 20 MG tablet Take 20 mg by mouth daily. For swelling   ? insulin glargine (LANTUS) 100 UNIT/ML injection Inject  10 Units into the skin at bedtime.   ? lisinopril (PRINIVIL,ZESTRIL) 10 MG tablet Take 10 mg by mouth daily.    ? ondansetron (ZOFRAN ODT) 4 MG disintegrating tablet Take 1 tablet (4 mg total) by mouth every 8 (eight) hours as needed for nausea or vomiting.  ? sodium bicarbonate 650 MG tablet Take 650 mg by mouth 2 (two) times daily.  ? Tamsulosin HCl (FLOMAX) 0.4 MG CAPS Take 0.4 mg by mouth daily after supper.    ? vitamin C (ASCORBIC ACID) 500 MG tablet Take 500 mg by mouth daily.    ? ?No current facility-administered medications for this visit. (Other)  ? ? ? ? ?REVIEW OF SYSTEMS: ? ? ? ?ALLERGIES ?No Known Allergies ? ?PAST MEDICAL HISTORY ?Past Medical History:  ?Diagnosis Date  ? Diabetes mellitus   ? GERD (gastroesophageal reflux disease)   ? Hypertension   ? Osteomyelitis of foot ([place])   ? Renal disorder   ? see Dr Tawni Carnes  ? ?Past Surgical History:  ?Procedure Laterality Date  ? AMPUTATION  01/25/2012  ? Procedure: AMPUTATION BELOW KNEE;  Surgeon: Rozanna Box, MD;  Location: [facility];  Service: Orthopedics;  Laterality: Right;  REVISION OF BELOW KNEE  AMPUTATION   ? APPLICATION OF WOUND VAC  01/25/2012  ? Procedure: APPLICATION OF WOUND VAC;  Surgeon: Legrand Como  Shari Heritage, MD;  Location: [facility];  Service: Orthopedics;  Laterality: N/A;  AND PLACEMENT OF WOUND VAC   ? LEG AMPUTATION BELOW KNEE    ? rt knee  ? ? ?FAMILY HISTORY ?History reviewed. No pertinent family history. ? ?SOCIAL HISTORY ?Social History  ? ?Tobacco Use  ? Smoking status: Never  ? Smokeless tobacco: Never  ?Substance Use Topics  ? Alcohol use: No  ? Drug use: No  ? ?  ? ?  ? ?OPHTHALMIC EXAM: ? ?Base Eye Exam   ? ? Visual Acuity (ETDRS)   ? ?   Right Left  ? Dist [place] 20/30 CF at 3'  ? Dist ph [place]  NI  ? ?  ?  ? ? Tonometry (Tonopen, 9:38 AM)   ? ?   Right Left  ? Pressure 14 18  ? ?  ?  ? ? Pupils   ? ?   Pupils Dark Light React APD  ? Right PERRL 3 3 Minimal None  ? Left PERRL 3 3 Minimal None  ? ?  ?  ? ? Visual Fields (Counting fingers)    ? ?   Left Right  ?   Full  ? Restrictions Partial outer superior temporal, inferior temporal, superior nasal, inferior nasal deficiencies   ? ?  ?  ? ? Extraocular Movement   ? ?   Right Left  ?  Full Full  ? ?  ?  ? ? Neuro/Psych   ? ? Oriented x3: Yes  ? Mood/Affect: Normal  ? ?  ?  ? ? Dilation   ? ? Both eyes: 1.0% Mydriacyl, 2.5% Phenylephrine @ 9:38 AM  ? ?  ?  ? ?  ? ?Slit Lamp and Fundus Exam   ? ? External Exam   ? ?   Right Left  ? External Normal Normal  ? ?  ?  ? ? Slit Lamp Exam   ? ?   Right Left  ? Lids/Lashes Normal Normal  ? Conjunctiva/Sclera White and quiet White and quiet  ? Cornea Clear Clear  ? Anterior Chamber Deep and quiet Deep and quiet  ? Iris Round and reactive Round and reactive  ? Lens Posterior chamber intraocular lens Posterior chamber intraocular lens  ? Anterior Vitreous Normal Normal  ? ?  ?  ? ? Fundus Exam   ? ?   Right Left  ? Posterior Vitreous Posterior vitreous detachment Posterior vitreous detachment  ? Disc 1+ Pallor 1+ Pallor  ? C/D Ratio 0.55 0.8  ? Macula no macular thickening, Focal laser scars, no exudates, Microaneurysms, Hard drusen, Early age related macular degeneration no macular thickening, Focal laser scars, no exudates, Microaneurysms, Hard drusen, Early age related macular degeneration  ? Vessels PDR-quiet PDR-quiet  ? Periphery Good PRP  Good PRP   ? ?  ?  ? ?  ? ? ?IMAGING AND PROCEDURES  ?Imaging and Procedures for 07/30/21 ? ?OCT, Retina - OU - Both Eyes   ? ?   ?Right Eye ?Quality was good. Scan locations included subfoveal. Central Foveal Thickness: 271. Findings include abnormal foveal contour, outer retinal atrophy.  ? ?Left Eye ?Quality was good. Scan locations included subfoveal. Central Foveal Thickness: 241. Findings include abnormal foveal contour, central retinal atrophy, outer retinal atrophy, inner retinal atrophy.  ? ?Notes ?Diffuse macular atrophy OU,, with no active maculopathy ? ?  ? ? ?  ?  ? ?  ?ASSESSMENT/PLAN: ? ?Stable treated  proliferative diabetic retinopathy  of right eye determined by examination associated with type 2 diabetes mellitus ([place]) ?The nature of regressed proliferative diabetic retinopathy was discussed with the patient. The patient was advised to maintain good glucose, blood pressure, monitor kidney function and serum lipid control as advised by personal physician. Rare risk for reactivation of progression exist with untreated severe anemia, untreated renal failure, untreated heart failure, and smoking. ?Complete avoidance of smoking was recommended. The chance of recurrent proliferative diabetic retinopathy was discussed as well as the chance of vitreous hemorrhage for which further treatments may be necessary.   Explained to the patient that the quiescent  proliferative diabetic retinopathy disease is unlikely to ever worsen.  Worsening factors would include however severe anemia, hypertension out-of-control or impending renal failure. ? ?No active maculopathy stable over time will observe ? ?Stable treated proliferative diabetic retinopathy of left eye determined by examination associated with type 2 diabetes mellitus ([place]) ?Good PRP, media clear no active PDR ? 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 ?Primary open angle glaucoma of both eyes, moderate stage ?Follow-up with general eye care Dr. Aram Beecham [facility] for continued intraocular pressure management and medication  ? ?  ICD-10-CM   ?1. Diabetic macular edema of right eye with proliferative retinopathy associated with type 2 diabetes mellitus ([place])  E11.3511 OCT, Retina - OU - Both Eyes  ?  Color Fundus Photography Optos - OU - Both Eyes  ?  ?2. Stable treated proliferative diabetic retinopathy of left eye determined by examination associated with type 2 diabetes mellitus ([place])  H47.4259 OCT, Retina - OU - Both Eyes  ?  Color Fundus Photography Optos - OU - Both Eyes  ?  ?3. Stable treated  proliferative diabetic retinopathy of right eye determined by examination associated with type 2 diabetes mellitus ([place])  D63.8756   ?  ?4. Primary open angle glaucoma of both eyes, moderate stage  H40.1132   ?

## 2021-07-30 NOTE — Assessment & Plan Note (Signed)
The nature of regressed proliferative diabetic retinopathy was discussed with the patient. The patient was advised to maintain good glucose, blood pressure, monitor kidney function and serum lipid control as advised by personal physician. Rare risk for reactivation of progression exist with untreated severe anemia, untreated renal failure, untreated heart failure, and smoking. ?Complete avoidance of smoking was recommended. The chance of recurrent proliferative diabetic retinopathy was discussed as well as the chance of vitreous hemorrhage for which further treatments may be necessary.   Explained to the patient that the quiescent  proliferative diabetic retinopathy disease is unlikely to ever worsen.  Worsening factors would include however severe anemia, hypertension out-of-control or impending renal failure. ? ?No active maculopathy stable over time will observe ?

## 2021-08-05 DIAGNOSIS — N189 Chronic kidney disease, unspecified: Secondary | ICD-10-CM | POA: Diagnosis not present

## 2021-08-12 DIAGNOSIS — E1122 Type 2 diabetes mellitus with diabetic chronic kidney disease: Secondary | ICD-10-CM | POA: Diagnosis not present

## 2021-08-12 DIAGNOSIS — E1129 Type 2 diabetes mellitus with other diabetic kidney complication: Secondary | ICD-10-CM | POA: Diagnosis not present

## 2021-08-12 DIAGNOSIS — R809 Proteinuria, unspecified: Secondary | ICD-10-CM | POA: Diagnosis not present

## 2021-08-12 DIAGNOSIS — I129 Hypertensive chronic kidney disease with stage 1 through stage 4 chronic kidney disease, or unspecified chronic kidney disease: Secondary | ICD-10-CM | POA: Diagnosis not present

## 2021-08-12 DIAGNOSIS — E211 Secondary hyperparathyroidism, not elsewhere classified: Secondary | ICD-10-CM | POA: Diagnosis not present

## 2021-08-12 DIAGNOSIS — D631 Anemia in chronic kidney disease: Secondary | ICD-10-CM | POA: Diagnosis not present

## 2021-08-12 DIAGNOSIS — N189 Chronic kidney disease, unspecified: Secondary | ICD-10-CM | POA: Diagnosis not present

## 2021-08-17 DIAGNOSIS — N189 Chronic kidney disease, unspecified: Secondary | ICD-10-CM | POA: Diagnosis not present

## 2021-08-17 DIAGNOSIS — E440 Moderate protein-calorie malnutrition: Secondary | ICD-10-CM | POA: Diagnosis not present

## 2021-08-17 DIAGNOSIS — F5104 Psychophysiologic insomnia: Secondary | ICD-10-CM | POA: Diagnosis not present

## 2021-08-17 DIAGNOSIS — R419 Unspecified symptoms and signs involving cognitive functions and awareness: Secondary | ICD-10-CM | POA: Diagnosis not present

## 2021-09-03 DIAGNOSIS — E782 Mixed hyperlipidemia: Secondary | ICD-10-CM | POA: Diagnosis not present

## 2021-09-03 DIAGNOSIS — E1122 Type 2 diabetes mellitus with diabetic chronic kidney disease: Secondary | ICD-10-CM | POA: Diagnosis not present

## 2021-09-10 DIAGNOSIS — I10 Essential (primary) hypertension: Secondary | ICD-10-CM | POA: Diagnosis not present

## 2021-09-10 DIAGNOSIS — R809 Proteinuria, unspecified: Secondary | ICD-10-CM | POA: Diagnosis not present

## 2021-09-10 DIAGNOSIS — E782 Mixed hyperlipidemia: Secondary | ICD-10-CM | POA: Diagnosis not present

## 2021-09-10 DIAGNOSIS — E440 Moderate protein-calorie malnutrition: Secondary | ICD-10-CM | POA: Diagnosis not present

## 2021-09-10 DIAGNOSIS — E1122 Type 2 diabetes mellitus with diabetic chronic kidney disease: Secondary | ICD-10-CM | POA: Diagnosis not present

## 2021-09-10 DIAGNOSIS — Z9181 History of falling: Secondary | ICD-10-CM | POA: Diagnosis not present

## 2021-09-10 DIAGNOSIS — R110 Nausea: Secondary | ICD-10-CM | POA: Diagnosis not present

## 2021-09-10 DIAGNOSIS — D631 Anemia in chronic kidney disease: Secondary | ICD-10-CM | POA: Diagnosis not present

## 2021-09-10 DIAGNOSIS — N184 Chronic kidney disease, stage 4 (severe): Secondary | ICD-10-CM | POA: Diagnosis not present

## 2021-09-10 DIAGNOSIS — I110 Hypertensive heart disease with heart failure: Secondary | ICD-10-CM | POA: Diagnosis not present

## 2021-09-10 DIAGNOSIS — Z89519 Acquired absence of unspecified leg below knee: Secondary | ICD-10-CM | POA: Diagnosis not present

## 2021-09-10 DIAGNOSIS — F5104 Psychophysiologic insomnia: Secondary | ICD-10-CM | POA: Diagnosis not present

## 2021-10-11 ENCOUNTER — Inpatient Hospital Stay (HOSPITAL_COMMUNITY)
Admission: EM | Admit: 2021-10-11 | Discharge: 2021-10-17 | DRG: 391 | Disposition: A | Discharge status: Skilled Nursing Facility | Payer: HMO | Attending: Family Medicine | Admitting: Family Medicine

## 2021-10-11 ENCOUNTER — Other Ambulatory Visit: Payer: Self-pay

## 2021-10-11 ENCOUNTER — Emergency Department (HOSPITAL_COMMUNITY): Payer: HMO

## 2021-10-11 ENCOUNTER — Encounter (HOSPITAL_COMMUNITY): Payer: Self-pay | Admitting: Emergency Medicine

## 2021-10-11 DIAGNOSIS — E11649 Type 2 diabetes mellitus with hypoglycemia without coma: Secondary | ICD-10-CM | POA: Diagnosis not present

## 2021-10-11 DIAGNOSIS — L89890 Pressure ulcer of other site, unstageable: Secondary | ICD-10-CM | POA: Diagnosis not present

## 2021-10-11 DIAGNOSIS — K5792 Diverticulitis of intestine, part unspecified, without perforation or abscess without bleeding: Secondary | ICD-10-CM | POA: Diagnosis not present

## 2021-10-11 DIAGNOSIS — T8789 Other complications of amputation stump: Secondary | ICD-10-CM | POA: Diagnosis not present

## 2021-10-11 DIAGNOSIS — K5720 Diverticulitis of large intestine with perforation and abscess without bleeding: Secondary | ICD-10-CM | POA: Diagnosis not present

## 2021-10-11 DIAGNOSIS — E119 Type 2 diabetes mellitus without complications: Secondary | ICD-10-CM | POA: Diagnosis not present

## 2021-10-11 DIAGNOSIS — L89151 Pressure ulcer of sacral region, stage 1: Secondary | ICD-10-CM | POA: Diagnosis present

## 2021-10-11 DIAGNOSIS — I700 Atherosclerosis of aorta: Secondary | ICD-10-CM | POA: Diagnosis not present

## 2021-10-11 DIAGNOSIS — Z794 Long term (current) use of insulin: Secondary | ICD-10-CM | POA: Diagnosis not present

## 2021-10-11 DIAGNOSIS — R41841 Cognitive communication deficit: Secondary | ICD-10-CM | POA: Diagnosis not present

## 2021-10-11 DIAGNOSIS — L89899 Pressure ulcer of other site, unspecified stage: Secondary | ICD-10-CM

## 2021-10-11 DIAGNOSIS — N401 Enlarged prostate with lower urinary tract symptoms: Secondary | ICD-10-CM | POA: Diagnosis present

## 2021-10-11 DIAGNOSIS — K219 Gastro-esophageal reflux disease without esophagitis: Secondary | ICD-10-CM | POA: Diagnosis present

## 2021-10-11 DIAGNOSIS — R338 Other retention of urine: Secondary | ICD-10-CM | POA: Diagnosis present

## 2021-10-11 DIAGNOSIS — R109 Unspecified abdominal pain: Secondary | ICD-10-CM | POA: Diagnosis not present

## 2021-10-11 DIAGNOSIS — D631 Anemia in chronic kidney disease: Secondary | ICD-10-CM | POA: Diagnosis present

## 2021-10-11 DIAGNOSIS — R1111 Vomiting without nausea: Secondary | ICD-10-CM | POA: Diagnosis not present

## 2021-10-11 DIAGNOSIS — K651 Peritoneal abscess: Secondary | ICD-10-CM | POA: Diagnosis present

## 2021-10-11 DIAGNOSIS — E1122 Type 2 diabetes mellitus with diabetic chronic kidney disease: Secondary | ICD-10-CM | POA: Diagnosis not present

## 2021-10-11 DIAGNOSIS — I129 Hypertensive chronic kidney disease with stage 1 through stage 4 chronic kidney disease, or unspecified chronic kidney disease: Secondary | ICD-10-CM | POA: Diagnosis not present

## 2021-10-11 DIAGNOSIS — E876 Hypokalemia: Secondary | ICD-10-CM | POA: Diagnosis not present

## 2021-10-11 DIAGNOSIS — L89152 Pressure ulcer of sacral region, stage 2: Secondary | ICD-10-CM | POA: Diagnosis not present

## 2021-10-11 DIAGNOSIS — I10 Essential (primary) hypertension: Secondary | ICD-10-CM | POA: Diagnosis not present

## 2021-10-11 DIAGNOSIS — Z741 Need for assistance with personal care: Secondary | ICD-10-CM | POA: Diagnosis not present

## 2021-10-11 DIAGNOSIS — H353131 Nonexudative age-related macular degeneration, bilateral, early dry stage: Secondary | ICD-10-CM | POA: Diagnosis not present

## 2021-10-11 DIAGNOSIS — N179 Acute kidney failure, unspecified: Secondary | ICD-10-CM | POA: Diagnosis present

## 2021-10-11 DIAGNOSIS — Z89511 Acquired absence of right leg below knee: Secondary | ICD-10-CM | POA: Diagnosis not present

## 2021-10-11 DIAGNOSIS — E860 Dehydration: Secondary | ICD-10-CM | POA: Diagnosis present

## 2021-10-11 DIAGNOSIS — N1832 Chronic kidney disease, stage 3b: Secondary | ICD-10-CM | POA: Diagnosis present

## 2021-10-11 DIAGNOSIS — K5700 Diverticulitis of small intestine with perforation and abscess without bleeding: Secondary | ICD-10-CM | POA: Diagnosis not present

## 2021-10-11 DIAGNOSIS — M6259 Muscle wasting and atrophy, not elsewhere classified, multiple sites: Secondary | ICD-10-CM | POA: Diagnosis not present

## 2021-10-11 DIAGNOSIS — R1312 Dysphagia, oropharyngeal phase: Secondary | ICD-10-CM | POA: Diagnosis not present

## 2021-10-11 DIAGNOSIS — M6281 Muscle weakness (generalized): Secondary | ICD-10-CM | POA: Diagnosis not present

## 2021-10-11 DIAGNOSIS — Z79899 Other long term (current) drug therapy: Secondary | ICD-10-CM

## 2021-10-11 DIAGNOSIS — H9190 Unspecified hearing loss, unspecified ear: Secondary | ICD-10-CM | POA: Diagnosis present

## 2021-10-11 DIAGNOSIS — L8990 Pressure ulcer of unspecified site, unspecified stage: Secondary | ICD-10-CM | POA: Insufficient documentation

## 2021-10-11 LAB — CBC WITH DIFFERENTIAL/PLATELET
Abs Immature Granulocytes: 0.12 10*3/uL — ABNORMAL HIGH (ref 0.00–0.07)
Basophils Absolute: 0 10*3/uL (ref 0.0–0.1)
Basophils Relative: 0 %
Eosinophils Absolute: 0 10*3/uL (ref 0.0–0.5)
Eosinophils Relative: 0 %
HCT: 32.2 % — ABNORMAL LOW (ref 39.0–52.0)
Hemoglobin: 10.7 g/dL — ABNORMAL LOW (ref 13.0–17.0)
Immature Granulocytes: 1 %
Lymphocytes Relative: 4 %
Lymphs Abs: 0.9 10*3/uL (ref 0.7–4.0)
MCH: 31.4 pg (ref 26.0–34.0)
MCHC: 33.2 g/dL (ref 30.0–36.0)
MCV: 94.4 fL (ref 80.0–100.0)
Monocytes Absolute: 1.5 10*3/uL — ABNORMAL HIGH (ref 0.1–1.0)
Monocytes Relative: 7 %
Neutro Abs: 18.2 10*3/uL — ABNORMAL HIGH (ref 1.7–7.7)
Neutrophils Relative %: 88 %
Platelets: 307 10*3/uL (ref 150–400)
RBC: 3.41 MIL/uL — ABNORMAL LOW (ref 4.22–5.81)
RDW: 14 % (ref 11.5–15.5)
WBC: 20.7 10*3/uL — ABNORMAL HIGH (ref 4.0–10.5)
nRBC: 0 % (ref 0.0–0.2)

## 2021-10-11 LAB — COMPREHENSIVE METABOLIC PANEL
ALT: 31 U/L (ref 0–44)
AST: 26 U/L (ref 15–41)
Albumin: 2.9 g/dL — ABNORMAL LOW (ref 3.5–5.0)
Alkaline Phosphatase: 89 U/L (ref 38–126)
Anion gap: 9 (ref 5–15)
BUN: 60 mg/dL — ABNORMAL HIGH (ref 8–23)
CO2: 25 mmol/L (ref 22–32)
Calcium: 8.8 mg/dL — ABNORMAL LOW (ref 8.9–10.3)
Chloride: 103 mmol/L (ref 98–111)
Creatinine, Ser: 2.34 mg/dL — ABNORMAL HIGH (ref 0.61–1.24)
GFR, Estimated: 25 mL/min — ABNORMAL LOW (ref >60)
Glucose, Bld: 175 mg/dL — ABNORMAL HIGH (ref 70–99)
Potassium: 3.4 mmol/L — ABNORMAL LOW (ref 3.5–5.1)
Sodium: 137 mmol/L (ref 135–145)
Total Bilirubin: 0.7 mg/dL (ref 0.3–1.2)
Total Protein: 5.9 g/dL — ABNORMAL LOW (ref 6.5–8.1)

## 2021-10-11 LAB — GLUCOSE, CAPILLARY
Glucose-Capillary: 140 mg/dL — ABNORMAL HIGH (ref 70–99)
Glucose-Capillary: 122 mg/dL — ABNORMAL HIGH (ref 70–99)

## 2021-10-11 LAB — LIPASE, BLOOD: Lipase: 18 U/L (ref 11–51)

## 2021-10-11 MED ORDER — PROMETHAZINE HCL 25 MG RE SUPP
25.0000 mg | Freq: Once | RECTAL | Status: AC
Start: 2021-10-11 — End: 2021-10-11
  Administered 2021-10-11: 25 mg via RECTAL

## 2021-10-11 MED ORDER — SODIUM CHLORIDE 0.9% FLUSH: 3.0000 mL | INTRAVENOUS | Status: DC | PRN

## 2021-10-11 MED ORDER — SODIUM CHLORIDE 0.9 % IV SOLN
INTRAVENOUS | Status: DC | PRN
Start: 2021-10-11 — End: 2021-10-13

## 2021-10-11 MED ORDER — ONDANSETRON HCL 4 MG/2ML IJ SOLN: 4.0000 mg | Freq: Four times a day (QID) | INTRAMUSCULAR | Status: DC | PRN

## 2021-10-11 MED ORDER — LORAZEPAM 2 MG/ML IJ SOLN
0.5000 mg | Freq: Two times a day (BID) | INTRAMUSCULAR | Status: AC | PRN
  Administered 2021-10-14 – 2021-10-15 (×2): 0.5 mg via INTRAVENOUS
  Filled 2021-10-11 (×3): qty 1

## 2021-10-11 MED ORDER — FENTANYL CITRATE PF 50 MCG/ML IJ SOSY: 25.0000 ug | PREFILLED_SYRINGE | INTRAMUSCULAR | Status: DC | PRN

## 2021-10-11 MED ORDER — GERHARDT'S BUTT CREAM
TOPICAL_CREAM | Freq: Two times a day (BID) | CUTANEOUS | Status: DC
Start: 2021-10-11 — End: 2021-10-17
  Filled 2021-10-11: qty 1

## 2021-10-11 MED ORDER — PROCHLORPERAZINE 25 MG RE SUPP
25.0000 mg | Freq: Once | RECTAL | Status: DC
  Filled 2021-10-11: qty 1

## 2021-10-11 MED ORDER — PANTOPRAZOLE SODIUM 40 MG PO TBEC: 40.0000 mg | DELAYED_RELEASE_TABLET | Freq: Every day | ORAL | Status: DC

## 2021-10-11 MED ORDER — OXYCODONE HCL 5 MG PO TABS: 5.0000 mg | ORAL_TABLET | ORAL | Status: DC | PRN

## 2021-10-11 MED ORDER — HEPARIN SODIUM (PORCINE) 5000 UNIT/ML IJ SOLN
5000.0000 [IU] | Freq: Three times a day (TID) | INTRAMUSCULAR | Status: DC
  Administered 2021-10-11 – 2021-10-17 (×18): 5000 [IU] via SUBCUTANEOUS
  Filled 2021-10-11 (×18): qty 1

## 2021-10-11 MED ORDER — PANTOPRAZOLE SODIUM 40 MG IV SOLR
40.0000 mg | Freq: Every day | INTRAVENOUS | Status: DC
  Administered 2021-10-11 – 2021-10-14 (×4): 40 mg via INTRAVENOUS
  Filled 2021-10-11 (×4): qty 10

## 2021-10-11 MED ORDER — PANTOPRAZOLE SODIUM 40 MG IV SOLR
40.0000 mg | Freq: Two times a day (BID) | INTRAVENOUS | Status: DC
  Administered 2021-10-11: 40 mg via INTRAVENOUS
  Filled 2021-10-11: qty 10

## 2021-10-11 MED ORDER — INSULIN GLARGINE-YFGN 100 UNIT/ML ~~LOC~~ SOLN
12.0000 [IU] | Freq: Every day | SUBCUTANEOUS | Status: DC
  Administered 2021-10-11 – 2021-10-14 (×4): 12 [IU] via SUBCUTANEOUS
  Filled 2021-10-11 (×5): qty 0.12

## 2021-10-11 MED ORDER — TAMSULOSIN HCL 0.4 MG PO CAPS
0.4000 mg | ORAL_CAPSULE | ORAL | Status: DC
  Administered 2021-10-11 – 2021-10-16 (×6): 0.4 mg via ORAL
  Filled 2021-10-11 (×6): qty 1

## 2021-10-11 MED ORDER — TRAZODONE HCL 50 MG PO TABS
50.0000 mg | ORAL_TABLET | Freq: Every evening | ORAL | Status: DC | PRN
  Administered 2021-10-14 – 2021-10-15 (×2): 50 mg via ORAL
  Filled 2021-10-11 (×2): qty 1

## 2021-10-11 MED ORDER — INSULIN ASPART 100 UNIT/ML IJ SOLN
0.0000 [IU] | Freq: Three times a day (TID) | INTRAMUSCULAR | Status: DC
  Administered 2021-10-12 – 2021-10-17 (×6): 1 [IU] via SUBCUTANEOUS

## 2021-10-11 MED ORDER — ACETAMINOPHEN 650 MG RE SUPP: 650.0000 mg | Freq: Four times a day (QID) | RECTAL | Status: DC | PRN

## 2021-10-11 MED ORDER — AMLODIPINE BESYLATE 5 MG PO TABS
5.0000 mg | ORAL_TABLET | Freq: Every day | ORAL | Status: DC
Start: 2021-10-12 — End: 2021-10-17
  Administered 2021-10-12 – 2021-10-16 (×5): 5 mg via ORAL
  Filled 2021-10-11 (×5): qty 1

## 2021-10-11 MED ORDER — ONDANSETRON HCL 4 MG PO TABS: 4.0000 mg | ORAL_TABLET | Freq: Four times a day (QID) | ORAL | Status: DC | PRN

## 2021-10-11 MED ORDER — ACETAMINOPHEN 325 MG PO TABS
650.0000 mg | ORAL_TABLET | Freq: Four times a day (QID) | ORAL | Status: DC | PRN
  Filled 2021-10-11: qty 2

## 2021-10-11 MED ORDER — POTASSIUM CHLORIDE 10 MEQ/100ML IV SOLN
10.0000 meq | INTRAVENOUS | Status: AC
  Administered 2021-10-11 (×4): 10 meq via INTRAVENOUS
  Filled 2021-10-11 (×4): qty 100

## 2021-10-11 MED ORDER — INSULIN ASPART 100 UNIT/ML IJ SOLN
0.0000 [IU] | Freq: Every day | INTRAMUSCULAR | Status: DC
  Administered 2021-10-12: 2 [IU] via SUBCUTANEOUS

## 2021-10-11 MED ORDER — SODIUM CHLORIDE 0.9 % IV SOLN: INTRAVENOUS | Status: DC

## 2021-10-11 MED ORDER — SODIUM BICARBONATE 650 MG PO TABS
650.0000 mg | ORAL_TABLET | Freq: Two times a day (BID) | ORAL | Status: DC
  Administered 2021-10-11 – 2021-10-17 (×12): 650 mg via ORAL
  Filled 2021-10-11 (×12): qty 1

## 2021-10-11 MED ORDER — PIPERACILLIN-TAZOBACTAM IN DEX 2-0.25 GM/50ML IV SOLN
2.2500 g | Freq: Three times a day (TID) | INTRAVENOUS | Status: DC
  Administered 2021-10-11 – 2021-10-12 (×2): 2.25 g via INTRAVENOUS
  Filled 2021-10-11 (×8): qty 50

## 2021-10-11 MED ORDER — SODIUM CHLORIDE 0.9% FLUSH
3.0000 mL | Freq: Two times a day (BID) | INTRAVENOUS | Status: DC
  Administered 2021-10-12 – 2021-10-17 (×9): 3 mL via INTRAVENOUS

## 2021-10-11 MED ORDER — SODIUM CHLORIDE 0.9% FLUSH
3.0000 mL | Freq: Two times a day (BID) | INTRAVENOUS | Status: DC
  Administered 2021-10-12: 3 mL via INTRAVENOUS

## 2021-10-11 MED ORDER — ONDANSETRON HCL 4 MG/2ML IJ SOLN
4.0000 mg | Freq: Once | INTRAMUSCULAR | Status: AC
  Administered 2021-10-11: 4 mg via INTRAVENOUS
  Filled 2021-10-11: qty 2

## 2021-10-11 MED ORDER — PIPERACILLIN-TAZOBACTAM 3.375 G IVPB 30 MIN
3.3750 g | Freq: Once | INTRAVENOUS | Status: AC
  Administered 2021-10-11: 3.375 g via INTRAVENOUS
  Filled 2021-10-11: qty 50

## 2021-10-11 MED ORDER — SODIUM CHLORIDE 0.9 % IV BOLUS
500.0000 mL | Freq: Once | INTRAVENOUS | Status: AC
Start: 2021-10-11 — End: 2021-10-11
  Administered 2021-10-11: 500 mL via INTRAVENOUS

## 2021-10-11 NOTE — Progress Notes (Signed)
Pt alert oriented to self and place. Confused at times calling out to wife Vaughan Basta. Son at bedside. Oriented to room and call light. Very HOH bilaterally. Pt complains of abdomen tenderness. Abdomen distended and tight. Bladder scanned and it shows >200 in bladder. MD informed. Orders to rescan @ 1800 possible in and out and collect UA at that time. Will continue to monitor patient. Call light within reach.

## 2021-10-11 NOTE — Progress Notes (Signed)
Diarrhea x3 full bed linen change. Vomited x2. MD informed. New orders for Phenergan rectally. Patient states he felt nervous. Pulled out IV. New IV placed 22g LFA NS going at 124m/hr. Forgetful of use of call light. Reinforced how to call for help if needed. Called son LFritz Pickerelto give update and patient seemed to calm down some. He is now resting comfortably in bed. Will continue to monitor and do frequent rounding on patient.

## 2021-10-11 NOTE — ED Provider Notes (Signed)
[name] Provider Note   CSN: [id number] Arrival date & time: 10/11/21  9381     History  Chief Complaint  Patient presents with   Emesis    Riley Griffith is a 88 y.o. male.  Pt is a 88 yo male with a pmhx significant for DM, HTN, CKD, GERD and osteomyelitis of right foot (s/p BKA).  Pt's son said pt has had trouble with n/v for a few weeks.  He did see his pcp who put him on a medication for GERD.  Son said that has not helped.  Pt had more vomiting this am.  He has pain in his upper abdomen.  No fever.       Home Medications Prior to Admission medications   Medication Sig Start Date End Date Taking? Authorizing Provider  acetaminophen (TYLENOL) 325 MG tablet Take 2 tablets (650 mg total) by mouth every 6 (six) hours as needed for mild pain, fever or headache (or Fever >/= 101). 11/19/18   Emokpae, Courage, MD  bimatoprost (LUMIGAN) 0.01 % SOLN USE 1 DROP TO LEFT EYE AT BEDTIME. 07/30/21   Rankin, Clent Demark, MD  brimonidine (ALPHAGAN P) 0.1 % SOLN Place 1 drop into the left eye 2 (two) times daily.    [provider]  calcitRIOL (ROCALTROL) 0.25 MCG capsule Take 0.25 mcg by mouth daily.    [provider]  carvedilol (COREG) 3.125 MG tablet Take 1 tablet (3.125 mg total) by mouth 2 (two) times daily with a meal. 11/19/18 11/19/19  Roxan Hockey, MD  cholecalciferol (VITAMIN D) 1000 UNITS tablet Take 1,000 Units by mouth daily.      [provider]  co-enzyme Q-10 30 MG capsule Take 30 mg by mouth daily.      [provider]  ferrous sulfate 325 (65 FE) MG tablet Take 325 mg by mouth daily with breakfast.    [provider]  furosemide (LASIX) 20 MG tablet Take 20 mg by mouth daily. For swelling     [provider]  insulin glargine (LANTUS) 100 UNIT/ML injection Inject 10 Units into the skin at bedtime.     [provider]  latanoprost (XALATAN) 0.005 % ophthalmic solution PLACE 1 DROP INTO BOTH  EYES DAILY. 04/02/21 04/02/22  Rankin, Clent Demark, MD  lisinopril (PRINIVIL,ZESTRIL) 10 MG tablet Take 10 mg by mouth daily.      [provider]  ondansetron (ZOFRAN ODT) 4 MG disintegrating tablet Take 1 tablet (4 mg total) by mouth every 8 (eight) hours as needed for nausea or vomiting. 11/19/18   Roxan Hockey, MD  sodium bicarbonate 650 MG tablet Take 650 mg by mouth 2 (two) times daily.    [provider]  Tamsulosin HCl (FLOMAX) 0.4 MG CAPS Take 0.4 mg by mouth daily after supper.      [provider]  vitamin C (ASCORBIC ACID) 500 MG tablet Take 500 mg by mouth daily.      [provider]      Allergies    Patient has no known allergies.    Review of Systems   Review of Systems  Gastrointestinal:  Positive for abdominal pain, nausea and vomiting.  All other systems reviewed and are negative.   Physical Exam Updated Vital Signs BP (!) 102/52   Pulse 93   Temp 98.2 F (36.8 C) (Oral)   Resp (!) 23   Ht '5\' 11"'$  (1.803 m)   Wt 77.1 kg   SpO2 96%  BMI 23.71 kg/m  Physical Exam Vitals and nursing note reviewed.  Constitutional:      Appearance: Normal appearance.  HENT:     Head: Normocephalic and atraumatic.     Right Ear: External ear normal.     Left Ear: External ear normal.     Nose: Nose normal.     Mouth/Throat:     Mouth: Mucous membranes are moist.     Pharynx: Oropharynx is clear.  Eyes:     Extraocular Movements: Extraocular movements intact.     Conjunctiva/sclera: Conjunctivae normal.     Pupils: Pupils are equal, round, and reactive to light.  Cardiovascular:     Rate and Rhythm: Normal rate and regular rhythm.     Pulses: Normal pulses.     Heart sounds: Normal heart sounds.  Pulmonary:     Effort: Pulmonary effort is normal.     Breath sounds: Normal breath sounds.  Abdominal:     General: Abdomen is flat. Bowel sounds are normal.     Palpations: Abdomen is soft.     Tenderness: There is abdominal tenderness in  the epigastric area.  Musculoskeletal:        General: Normal range of motion.     Cervical back: Normal range of motion and neck supple.     Comments: S/p R BKA  Skin:    General: Skin is warm.     Capillary Refill: Capillary refill takes less than 2 seconds.     Comments: Pressure ulcer right stump  Neurological:     General: No focal deficit present.     Mental Status: He is alert and oriented to person, place, and time.  Psychiatric:        Mood and Affect: Mood normal.        Behavior: Behavior normal.        Thought Content: Thought content normal.        Judgment: Judgment normal.     ED Results / Procedures / Treatments   Labs (all labs ordered are listed, but only abnormal results are displayed) Labs Reviewed  CBC WITH DIFFERENTIAL/PLATELET - Abnormal; Notable for the following components:      Result Value   WBC 20.7 (*)    RBC 3.41 (*)    Hemoglobin 10.7 (*)    HCT 32.2 (*)    Neutro Abs 18.2 (*)    Monocytes Absolute 1.5 (*)    Abs Immature Granulocytes 0.12 (*)    All other components within normal limits  COMPREHENSIVE METABOLIC PANEL - Abnormal; Notable for the following components:   Potassium 3.4 (*)    Glucose, Bld 175 (*)    BUN 60 (*)    Creatinine, Ser 2.34 (*)    Calcium 8.8 (*)    Total Protein 5.9 (*)    Albumin 2.9 (*)    GFR, Estimated 25 (*)    All other components within normal limits  LIPASE, BLOOD  URINALYSIS, ROUTINE W REFLEX MICROSCOPIC    EKG EKG Interpretation  Date/Time:  Sunday October 11 2021 09:42:34 EDT Ventricular Rate:  93 PR Interval:  174 QRS Duration: 101 QT Interval:  361 QTC Calculation: 449 R Axis:   -36 Text Interpretation: Sinus rhythm Multiform ventricular premature complexes Left axis deviation Low voltage, precordial leads Anteroseptal infarct, old Nonspecific T abnormalities, lateral leads No significant change since last tracing Confirmed by Isla Pence [phone]) on 10/11/2021 10:04:46 AM  Radiology CT  ABDOMEN PELVIS WO CONTRAST  Result Date: 10/11/2021  CLINICAL DATA:  Abdominal pain and vomiting for several days. Coffee-ground emesis. EXAM: CT ABDOMEN AND PELVIS WITHOUT CONTRAST TECHNIQUE: Multidetector CT imaging of the abdomen and pelvis was performed following the standard protocol without IV contrast. RADIATION DOSE REDUCTION: This exam was performed according to the departmental dose-optimization program which includes automated exposure control, adjustment of the mA and/or kV according to patient size and/or use of iterative reconstruction technique. COMPARISON:  11/15/2018 FINDINGS: Lower chest: New tiny right pleural effusion. Stable bilateral lower lobe scarring and mild bronchiectasis. Hepatobiliary: No mass visualized on this unenhanced exam. Prior cholecystectomy. No evidence of biliary obstruction. Pancreas: No mass or inflammatory process visualized on this unenhanced exam. Spleen:  Within normal limits in size. Adrenals/Urinary tract: No evidence of urolithiasis or hydronephrosis. Unremarkable unopacified urinary bladder. Stomach/Bowel: Small hiatal hernia again noted. Evaluation of bowel limited by lack of IV and oral contrast. Severe sigmoid diverticulosis is seen. Mild-to-moderate colonic wall thickening is seen involving the mid sigmoid colon with adjacent pericolonic soft tissue stranding, consistent with mild diverticulitis. A focal pericolonic collection is seen in the left pelvis measuring 3.1 x 2.8 cm, suspicious for a small pericolonic abscess. No evidence of bowel obstruction or free intraperitoneal air. Vascular/Lymphatic: No pathologically enlarged lymph nodes identified. No evidence of abdominal aortic aneurysm. Aortic atherosclerotic calcification incidentally noted. Reproductive: Moderately enlarged prostate gland, without significant change. Other:  None. Musculoskeletal:  No suspicious bone lesions identified. IMPRESSION: Probable sigmoid diverticulitis with small pericolonic  abscess. Recommend continued follow-up by abdomen pelvis CT with IV contrast. New tiny right pleural effusion. Stable small hiatal hernia. Stable moderately enlarged prostate. Aortic Atherosclerosis (ICD10-I70.0). Electronically Signed   By: Marlaine Hind M.D.   On: 10/11/2021 10:57    Procedures Procedures    Medications Ordered in ED Medications  piperacillin-tazobactam (ZOSYN) IVPB 3.375 g (has no administration in time range)  pantoprazole (PROTONIX) injection 40 mg (has no administration in time range)  ondansetron (ZOFRAN) injection 4 mg (4 mg Intravenous Given 10/11/21 1057)  sodium chloride 0.9 % bolus 500 mL (500 mLs Intravenous New Bag/Given 10/11/21 1054)    ED Course/ Medical Decision Making/ A&P                           Medical Decision Making Amount and/or Complexity of Data Reviewed Labs: ordered. Radiology: ordered.  Risk Prescription drug management. Decision regarding hospitalization.   This patient presents to the ED for concern of abd pain, this involves an extensive number of treatment options, and is a complaint that carries with it a high risk of complications and morbidity.  The differential diagnosis includes gastritis, ulcer, infection, gi bleed   Co morbidities that complicate the patient evaluation   DM, HTN, CKD, GERD and osteomyelitis of right foot (s/p BKA)   Additional history obtained:  Additional history obtained from epic chart review External records from outside source obtained and reviewed including son   Lab Tests:  I Ordered, and personally interpreted labs.  The pertinent results include:  cbc with wbc elevated to 20.7, cmp with bun 60 and Cr 2.34 (chronic), lip nl   Imaging Studies ordered:  I ordered imaging studies including CT abd/pelvis  I independently visualized and interpreted imaging which showed  IMPRESSION:  Probable sigmoid diverticulitis with small pericolonic abscess.  Recommend continued follow-up by abdomen  pelvis CT with IV contrast.    New tiny right pleural effusion.    Stable small hiatal hernia.    Stable  moderately enlarged prostate.    Aortic Atherosclerosis (ICD10-I70.0).   I agree with the radiologist interpretation   Cardiac Monitoring:  The patient was maintained on a cardiac monitor.  I personally viewed and interpreted the cardiac monitored which showed an underlying rhythm of: nsr   Medicines ordered and prescription drug management:  I ordered medication including zofran and ivfs  for dehydration and zosyn for diverticulitis  Reevaluation of the patient after these medicines showed that the patient improved I have reviewed the patients home medicines and have made adjustments as needed   Test Considered:  ct   Critical Interventions:  abx   Consultations Obtained:  I requested consultation with the surgeon,  and discussed lab and imaging findings as well as pertinent plan - he recommends admission to medicine for IV abx.  No surgical intervention needed now. Pt d/w Dr. Maurene Capes ([facility]) who will admit.   Problem List / ED Course:  Diverticulitis with small abscess:  pt given IV abx Pressure ulcer:  son said the combo of ketoconazole and steroid cream took care of it in the past.   Reevaluation:  After the interventions noted above, I reevaluated the patient and found that they have :improved   Social Determinants of Health:  Lives at home   Dispostion:  After consideration of the diagnostic results and the patients response to treatment, I feel that the patent would benefit from admission.          Final Clinical Impression(s) / ED Diagnoses Final diagnoses:  Diverticulitis of large intestine with abscess without bleeding  Pressure ulcer of below knee amputation stump [facility])    Rx / DC Orders ED Discharge Orders     None         Isla Pence, MD 10/11/21 1138

## 2021-10-11 NOTE — ED Notes (Signed)
Family concerned right stump is infected. MD aware.

## 2021-10-11 NOTE — Consult Note (Signed)
[facility] Nurse Consult Note: Reason for Consult:Wounds Wound type:Patient with Stage 1 to sacrum and coccyx, Stage 2 to left buttock and Unstageable to right medial LE where admitting MD notes abrasion from prosthetic limb Pressure Injury POA: Yes Measurement:Per Nursing Flow Sheet Left buttock Stage 2: 4cm x 3cm x 0.1cm Pink, moist Right medial knee Stage 2:0.5cm round with depth unable to be determined due to the presence of yellow movable slough Sacrum/including coccyx: Stage 1: 10cm x 7cm (intact skin) Wound bed: As noted above Drainage (amount, consistency, odor) small yellow from knee otherwise serous and small Periwound: Dressing procedure/placement/frequency:I will provide the patient today with a mattress replacement and have provided Nursing with guidance for the turning and repositioning of the patient to include minimizing time in the supine position. A heel pressure redistribution boot is provided for the left LE. The wounds on the right BKA site at knee, left buttock and sacrum are to be treated with an antimicrobial nonadherent gauze (folded layers of xeroform) topped with dry gauze and covered with silicone foam dressings for atraumatic dressing changes.  [facility] nursing team will not follow, but will remain available to this patient, the nursing and medical teams.  Please re-consult if needed.  Thank you for inviting [place] to participate in this patient's Plan of Care.  Maudie Flakes, MSN, RN, CNS, [place], Serita Grammes, [employer], [employer] phone:  [phone]

## 2021-10-11 NOTE — ED Triage Notes (Signed)
Pt to the ED via [facility] from home with complaints of coffee ground emesis.  Pt states he has been unwell for the past few days and the vomiting started today.

## 2021-10-11 NOTE — Progress Notes (Signed)
Pharmacy Antibiotic Note  Riley Griffith is a 88 y.o. male admitted on 10/11/2021 with  intra-abdominal infxn .  Pharmacy has been consulted for Zosyn dosing.  Plan: Zosyn 2.25gm IV q8hrs (renally adjusted)  Height: '5\' 11"'$  (180.3 cm) Weight: 77.1 kg (169 lb 15.6 oz) IBW/kg (Calculated) : 75.3  Temp (24hrs), Avg:98.2 F (36.8 C), Min:98.2 F (36.8 C), Max:98.2 F (36.8 C)  Recent Labs  Lab 10/11/21 1017  WBC 20.7*  CREATININE 2.34*    Estimated Creatinine Clearance: 19.7 mL/min (A) (by C-G formula based on SCr of 2.34 mg/dL (H)).    No Known Allergies  Antimicrobials this admission: Zosyn 6/18 >>    Dose adjustments this admission:   Microbiology results:  BCx: pending  UCx: pending   Sputum: pending   MRSA PCR: pending  Thank you for allowing pharmacy to be a part of this patient's care.  Hart Robinsons A 10/11/2021 12:51 PM

## 2021-10-11 NOTE — H&P (Signed)
Patient Demographics:    Riley Griffith, is a 88 y.o. male  MRN: [id number]   DOB - 07/03/1923  Admit Date - 10/11/2021  Outpatient Primary MD for the patient is [name], MD   Assessment & Plan:   Assessment and Plan:  1)Sigmoid Diverticulitis with Small Pericolonic Abscess--CT abdomen and pelvis finding noted -EDP discussed with general surgeon Dr. Arnoldo Morale who recommends antibiotics and nonsurgical management at this time WBC 20.7 -IV Zosyn, IV fluids -As needed antiemetics and pain medications  2) CKD stage -3B - creatinine on admission= 2.34 ,  which is not far from prior baseline,  -Serum bicarb 25, potassium 3.4 -hold Lasix, hold lisinopril--due to concerns about emesis and risk for dehydration/AKI, -Gentle IV fluids - renally adjust medications, avoid nephrotoxic agents / dehydration  / hypotension  3)Anemia of CKD--Hgb 10.7, close to baseline -No evidence of ongoing bleeding -Monitor closely  4)Hypokalemia--- potassium 3.4 , suspect due to emesis/GI losses replace and recheck  5)DM2- Reduce Lantus insulin to 12 units nightly pending better tolerance of oral intake Use Novolog/Humalog Sliding scale insulin with Accu-Cheks/Fingersticks as ordered   6)HTN--hold Lasix, hold lisinopril--due to concerns about emesis and risk for dehydration/AKI, -- continue amlodipine  7)GERD--continue Protonix especially given recurrent emesis  8) BPH---?? LUTS... Difficulty with urination, give Flomax okay to do in and out catheterization if urinary retention persist  9)PI---POA--- pressure injury from right BKA prosthesis -right knee area with abrasion from prosthesis -Wound care consult requested  Disposition/Need for in-Hospital Stay- patient unable to be discharged at this time due to sigmoid of the  colitis with abscess requiring IV antibiotics, may need surgical intervention if fails to improve with antibiotics  Status is: Inpatient  Remains inpatient appropriate because:   Dispo: The patient is from: Home              Anticipated d/c is to: Home              Anticipated d/c date is: 3 days              Patient currently is not medically stable to d/c. Barriers: Not Clinically Stable-   With History of - Reviewed by me  Past Medical History:  Diagnosis Date   Diabetes mellitus    GERD (gastroesophageal reflux disease)    Hypertension    Osteomyelitis of foot (HCC)    Renal disorder    see Dr Tawni Carnes      Past Surgical History:  Procedure Laterality Date   AMPUTATION  01/25/2012   Procedure: AMPUTATION BELOW KNEE;  Surgeon: Rozanna Box, MD;  Location: [facility];  Service: Orthopedics;  Laterality: Right;  REVISION OF BELOW KNEE  AMPUTATION    APPLICATION OF WOUND VAC  01/25/2012   Procedure: APPLICATION OF WOUND VAC;  Surgeon: Rozanna Box, MD;  Location: [facility];  Service: Orthopedics;  Laterality: N/A;  AND PLACEMENT OF WOUND VAC    LEG  AMPUTATION BELOW KNEE     rt knee    Chief Complaint  Patient presents with   Emesis      HPI:    Riley Griffith  is a 88 y.o. male   with a pmhx significant for DM, HTN, CKD 3B, GERD and osteomyelitis of right foot (s/p BKA), as well as hearing loss and chronic anemia of CKD who presents to the ED with abdominal pain, nausea, vomiting and loose stools for couple weeks worse over the last few days -Denies fevers or chills -Patient very hard of hearing, additional history obtained from patient's son at bedside -Emesis was without bile or blood -Contrary to documentation in the previous notes patient and son deny any history of hematemesis or coffee-ground emesis -No sick contacts at home -No chest pain , no palpitation no dizziness no dizziness In ED WBC 20.7, Hgb 10.7, platelets 307 Potassium 3.4, BUN 60, creatinine 2.34 which  is not far from prior baseline, bicarb 25 -LFTs are not elevated CT AP with sigmoid Colitis with small pericolonic abscess -   Review of systems:    In addition to the HPI above,   A full Review of  Systems was done, all other systems reviewed are negative except as noted above in HPI , .    Social History:  Reviewed by me    Social History   Tobacco Use   Smoking status: Never   Smokeless tobacco: Never  Substance Use Topics   Alcohol use: No       Family History :  Reviewed by me   History reviewed. No pertinent family history.   Home Medications:   Prior to Admission medications   Medication Sig Start Date End Date Taking? Authorizing Provider  acetaminophen (TYLENOL) 325 MG tablet Take 2 tablets (650 mg total) by mouth every 6 (six) hours as needed for mild pain, fever or headache (or Fever >/= 101). 11/19/18  Yes Aster Eckrich, MD  amLODipine (NORVASC) 5 MG tablet Take 5 mg by mouth at bedtime. 09/17/21  Yes [provider]  bimatoprost (LUMIGAN) 0.01 % SOLN USE 1 DROP TO LEFT EYE AT BEDTIME. 07/30/21  Yes Rankin, Clent Demark, MD  calcitRIOL (ROCALTROL) 0.25 MCG capsule Take 0.25 mcg by mouth daily.   Yes [provider]  cholecalciferol (VITAMIN D) 1000 UNITS tablet Take 1,000 Units by mouth daily.     Yes [provider]  furosemide (LASIX) 20 MG tablet Take 20 mg by mouth daily. For swelling    Yes [provider]  insulin glargine (LANTUS) 100 UNIT/ML injection Inject 20-22 Units into the skin at bedtime. 5-6 PM   Yes [provider]  lisinopril (PRINIVIL,ZESTRIL) 10 MG tablet Take 10 mg by mouth daily.     Yes [provider]  pantoprazole (PROTONIX) 20 MG tablet Take 20 mg by mouth daily. 10/08/21  Yes [provider]  sodium bicarbonate 650 MG tablet Take 650 mg by mouth 2 (two) times daily.   Yes [provider]  Tamsulosin HCl (FLOMAX) 0.4 MG CAPS Take 0.4 mg by mouth daily after supper.      Yes [provider]     Allergies:    No Known Allergies   Physical Exam:   Vitals  Blood pressure (!) 110/51, pulse 92, temperature 98.4 F (36.9 C), resp. rate 17, height '5\' 11"'$  (1.803 m), weight 77.1 kg, SpO2 92 %.  Physical Examination: General appearance - alert,  in no distress  Mental status -  alert, oriented to person, place, and time,  Eyes - sclera anicteric Ears--HOH Neck - supple, no JVD elevation , Chest - clear  to auscultation bilaterally, symmetrical air movement,  Heart - S1 and S2 normal, regular  Abdomen - soft, very slightly distended, +BS, lower abdominal tenderness, no rebound or guarding, no CVA area tenderness Neurological - screening mental status exam normal, neck supple without rigidity, cranial nerves II through XII intact, DTR's normal and symmetric Skin - warm, dry MSK-right BKA stump appears well-healed, right knee area with abrasion from prosthesis     Data Review:    CBC Recent Labs  Lab 10/11/21 1017  WBC 20.7*  HGB 10.7*  HCT 32.2*  PLT 307  MCV 94.4  MCH 31.4  MCHC 33.2  RDW 14.0  LYMPHSABS 0.9  MONOABS 1.5*  EOSABS 0.0  BASOSABS 0.0   ------------------------------------------------------------------------------------------------------------------  Chemistries  Recent Labs  Lab 10/11/21 1017  NA 137  K 3.4*  CL 103  CO2 25  GLUCOSE 175*  BUN 60*  CREATININE 2.34*  CALCIUM 8.8*  AST 26  ALT 31  ALKPHOS 89  BILITOT 0.7   ------------------------------------------------------------------------------------------------------------------ estimated creatinine clearance is 19.7 mL/min (A) (by C-G formula based on SCr of 2.34 mg/dL (H)). ------------------------------------------------------------------------------------------------------------------ No results for input(s): "TSH", "T4TOTAL", "T3FREE", "THYROIDAB" in the last 72 hours.  Invalid input(s): "FREET3"   Coagulation profile No results for  input(s): "INR", "PROTIME" in the last 168 hours. ------------------------------------------------------------------------------------------------------------------- No results for input(s): "DDIMER" in the last 72 hours. -------------------------------------------------------------------------------------------------------------------  Cardiac Enzymes No results for input(s): "CKMB", "TROPONINI", "MYOGLOBIN" in the last 168 hours.  Invalid input(s): "CK" ------------------------------------------------------------------------------------------------------------------ No results found for: "BNP"   ---------------------------------------------------------------------------------------------------------------  Urinalysis    Component Value Date/Time   COLORURINE AMBER (A) 11/16/2018 0840   APPEARANCEUR HAZY (A) 11/16/2018 0840   LABSPEC 1.016 11/16/2018 0840   PHURINE 5.0 11/16/2018 0840   GLUCOSEU NEGATIVE 11/16/2018 0840   HGBUR NEGATIVE 11/16/2018 0840   BILIRUBINUR NEGATIVE 11/16/2018 0840   KETONESUR 5 (A) 11/16/2018 0840   PROTEINUR NEGATIVE 11/16/2018 0840   NITRITE NEGATIVE 11/16/2018 0840   LEUKOCYTESUR NEGATIVE 11/16/2018 0840    ----------------------------------------------------------------------------------------------------------------   Imaging Results:    CT ABDOMEN PELVIS WO CONTRAST  Result Date: 10/11/2021 CLINICAL DATA:  Abdominal pain and vomiting for several days. Coffee-ground emesis. EXAM: CT ABDOMEN AND PELVIS WITHOUT CONTRAST TECHNIQUE: Multidetector CT imaging of the abdomen and pelvis was performed following the standard protocol without IV contrast. RADIATION DOSE REDUCTION: This exam was performed according to the departmental dose-optimization program which includes automated exposure control, adjustment of the mA and/or kV according to patient size and/or use of iterative reconstruction technique. COMPARISON:  11/15/2018 FINDINGS: Lower chest: New  tiny right pleural effusion. Stable bilateral lower lobe scarring and mild bronchiectasis. Hepatobiliary: No mass visualized on this unenhanced exam. Prior cholecystectomy. No evidence of biliary obstruction. Pancreas: No mass or inflammatory process visualized on this unenhanced exam. Spleen:  Within normal limits in size. Adrenals/Urinary tract: No evidence of urolithiasis or hydronephrosis. Unremarkable unopacified urinary bladder. Stomach/Bowel: Small hiatal hernia again noted. Evaluation of bowel limited by lack of IV and oral contrast. Severe sigmoid diverticulosis is seen. Mild-to-moderate colonic wall thickening is seen involving the mid sigmoid colon with adjacent pericolonic soft tissue stranding, consistent with mild diverticulitis. A focal pericolonic collection is seen in the left pelvis measuring 3.1 x 2.8 cm, suspicious for a small pericolonic abscess. No evidence of bowel obstruction or free intraperitoneal air. Vascular/Lymphatic: No pathologically enlarged lymph nodes  identified. No evidence of abdominal aortic aneurysm. Aortic atherosclerotic calcification incidentally noted. Reproductive: Moderately enlarged prostate gland, without significant change. Other:  None. Musculoskeletal:  No suspicious bone lesions identified. IMPRESSION: Probable sigmoid diverticulitis with small pericolonic abscess. Recommend continued follow-up by abdomen pelvis CT with IV contrast. New tiny right pleural effusion. Stable small hiatal hernia. Stable moderately enlarged prostate. Aortic Atherosclerosis (ICD10-I70.0). Electronically Signed   By: Marlaine Hind M.D.   On: 10/11/2021 10:57    Radiological Exams on Admission: CT ABDOMEN PELVIS WO CONTRAST  Result Date: 10/11/2021 CLINICAL DATA:  Abdominal pain and vomiting for several days. Coffee-ground emesis. EXAM: CT ABDOMEN AND PELVIS WITHOUT CONTRAST TECHNIQUE: Multidetector CT imaging of the abdomen and pelvis was performed following the standard protocol  without IV contrast. RADIATION DOSE REDUCTION: This exam was performed according to the departmental dose-optimization program which includes automated exposure control, adjustment of the mA and/or kV according to patient size and/or use of iterative reconstruction technique. COMPARISON:  11/15/2018 FINDINGS: Lower chest: New tiny right pleural effusion. Stable bilateral lower lobe scarring and mild bronchiectasis. Hepatobiliary: No mass visualized on this unenhanced exam. Prior cholecystectomy. No evidence of biliary obstruction. Pancreas: No mass or inflammatory process visualized on this unenhanced exam. Spleen:  Within normal limits in size. Adrenals/Urinary tract: No evidence of urolithiasis or hydronephrosis. Unremarkable unopacified urinary bladder. Stomach/Bowel: Small hiatal hernia again noted. Evaluation of bowel limited by lack of IV and oral contrast. Severe sigmoid diverticulosis is seen. Mild-to-moderate colonic wall thickening is seen involving the mid sigmoid colon with adjacent pericolonic soft tissue stranding, consistent with mild diverticulitis. A focal pericolonic collection is seen in the left pelvis measuring 3.1 x 2.8 cm, suspicious for a small pericolonic abscess. No evidence of bowel obstruction or free intraperitoneal air. Vascular/Lymphatic: No pathologically enlarged lymph nodes identified. No evidence of abdominal aortic aneurysm. Aortic atherosclerotic calcification incidentally noted. Reproductive: Moderately enlarged prostate gland, without significant change. Other:  None. Musculoskeletal:  No suspicious bone lesions identified. IMPRESSION: Probable sigmoid diverticulitis with small pericolonic abscess. Recommend continued follow-up by abdomen pelvis CT with IV contrast. New tiny right pleural effusion. Stable small hiatal hernia. Stable moderately enlarged prostate. Aortic Atherosclerosis (ICD10-I70.0). Electronically Signed   By: Marlaine Hind M.D.   On: 10/11/2021 10:57    DVT  Prophylaxis -SCD /Heparin AM Labs Ordered, also please review Full Orders  Family Communication: Admission, patients condition and plan of care including tests being ordered have been discussed with the patient and son at bedside who indicate understanding and agree with the plan   Condition  -Stable  Roxan Hockey M.D on 10/11/2021 at 3:27 PM Go to [email or web address] -  for contact info  [facility] - Office  [phone]

## 2021-10-12 DIAGNOSIS — K5792 Diverticulitis of intestine, part unspecified, without perforation or abscess without bleeding: Secondary | ICD-10-CM | POA: Diagnosis not present

## 2021-10-12 LAB — BASIC METABOLIC PANEL
Anion gap: 6 (ref 5–15)
BUN: 55 mg/dL — ABNORMAL HIGH (ref 8–23)
CO2: 23 mmol/L (ref 22–32)
Calcium: 8.3 mg/dL — ABNORMAL LOW (ref 8.9–10.3)
Chloride: 110 mmol/L (ref 98–111)
Creatinine, Ser: 1.92 mg/dL — ABNORMAL HIGH (ref 0.61–1.24)
GFR, Estimated: 31 mL/min — ABNORMAL LOW (ref >60)
Glucose, Bld: 113 mg/dL — ABNORMAL HIGH (ref 70–99)
Potassium: 3.5 mmol/L (ref 3.5–5.1)
Sodium: 139 mmol/L (ref 135–145)

## 2021-10-12 LAB — GLUCOSE, CAPILLARY
Glucose-Capillary: 109 mg/dL — ABNORMAL HIGH (ref 70–99)
Glucose-Capillary: 167 mg/dL — ABNORMAL HIGH (ref 70–99)
Glucose-Capillary: 189 mg/dL — ABNORMAL HIGH (ref 70–99)
Glucose-Capillary: 221 mg/dL — ABNORMAL HIGH (ref 70–99)

## 2021-10-12 LAB — CBC
HCT: 31.1 % — ABNORMAL LOW (ref 39.0–52.0)
Hemoglobin: 10.1 g/dL — ABNORMAL LOW (ref 13.0–17.0)
MCH: 31.2 pg (ref 26.0–34.0)
MCHC: 32.5 g/dL (ref 30.0–36.0)
MCV: 96 fL (ref 80.0–100.0)
Platelets: 278 10*3/uL (ref 150–400)
RBC: 3.24 MIL/uL — ABNORMAL LOW (ref 4.22–5.81)
RDW: 14 % (ref 11.5–15.5)
WBC: 17.9 10*3/uL — ABNORMAL HIGH (ref 4.0–10.5)
nRBC: 0 % (ref 0.0–0.2)

## 2021-10-12 MED ORDER — PROCHLORPERAZINE 25 MG RE SUPP
25.0000 mg | Freq: Once | RECTAL | Status: AC
Start: 2021-10-12 — End: 2021-10-12
  Administered 2021-10-12: 25 mg via RECTAL
  Filled 2021-10-12: qty 1

## 2021-10-12 MED ORDER — PIPERACILLIN-TAZOBACTAM 3.375 G IVPB
3.3750 g | Freq: Three times a day (TID) | INTRAVENOUS | Status: DC
  Administered 2021-10-12 – 2021-10-15 (×9): 3.375 g via INTRAVENOUS
  Filled 2021-10-12 (×9): qty 50

## 2021-10-12 MED ORDER — SODIUM CHLORIDE 0.9 % IV SOLN: INTRAVENOUS | Status: AC

## 2021-10-12 MED ORDER — CHLORHEXIDINE GLUCONATE CLOTH 2 % EX PADS
6.0000 | MEDICATED_PAD | Freq: Every day | CUTANEOUS | Status: DC
  Administered 2021-10-12 – 2021-10-17 (×6): 6 via TOPICAL

## 2021-10-12 MED ORDER — POTASSIUM CHLORIDE CRYS ER 20 MEQ PO TBCR
40.0000 meq | EXTENDED_RELEASE_TABLET | Freq: Once | ORAL | Status: AC
  Administered 2021-10-12: 40 meq via ORAL
  Filled 2021-10-12: qty 2

## 2021-10-12 NOTE — Consult Note (Signed)
[facility] Consult  Reason for Consult: Diverticulitis, diverticular abscess Referring Physician: Dr. Denton Brick  Chief Complaint   Emesis     HPI: Riley Griffith is a 88 y.o. male who was admitted with sigmoid diverticulitis.  He presented to the ED with nausea and vomiting.  Upon questioning patient, he denies ever having abdominal pain, though previous documentation states that that was one of the reasons he presented to the hospital.  He denies any fevers or chills.  He has not had any episodes of emesis today.    Patient surgical history is significant for a right BKA.  He denies any history of abdominal surgeries.  He denies use of blood thinning medications.  He is oriented to self only, as he believes it is 58.  In the ED, he underwent a CT abdomen and pelvis without IV contrast which demonstrated sigmoid diverticulitis with a small pericolonic abscess, 3.1 x 2.8 cm.  He was also noted to have a leukocytosis of 20.7 and Cr of 2.34.  Past Medical History:  Diagnosis Date   Diabetes mellitus    GERD (gastroesophageal reflux disease)    Hypertension    Osteomyelitis of foot ([place])    Renal disorder    see Dr Tawni Carnes    Past Surgical History:  Procedure Laterality Date   AMPUTATION  01/25/2012   Procedure: AMPUTATION BELOW KNEE;  Surgeon: Rozanna Box, MD;  Location: [place];  Service: Orthopedics;  Laterality: Right;  REVISION OF BELOW KNEE  AMPUTATION    APPLICATION OF WOUND VAC  01/25/2012   Procedure: APPLICATION OF WOUND VAC;  Surgeon: Rozanna Box, MD;  Location: [place];  Service: Orthopedics;  Laterality: N/A;  AND PLACEMENT OF WOUND VAC    LEG AMPUTATION BELOW KNEE     rt knee    History reviewed. No pertinent family history.  Social History   Tobacco Use   Smoking status: Never   Smokeless tobacco: Never  Vaping Use   Vaping Use: Never used  Substance Use Topics   Alcohol use: No   Drug use: No    Medications: I have reviewed the  patient's current medications.  No Known Allergies   ROS:  Constitutional: negative for chills, fatigue, and fevers Respiratory: negative for cough and shortness of breath Cardiovascular: negative for chest pain and palpitations Gastrointestinal: negative for abdominal pain, nausea, and vomiting  Blood pressure (!) 111/56, pulse 84, temperature 98.3 F (36.8 C), temperature source Oral, resp. rate 20, height '5\' 11"'$  (1.803 m), weight 77.1 kg, SpO2 94 %. Physical Exam Vitals reviewed.  Constitutional:      Appearance: Normal appearance.  HENT:     Head: Normocephalic and atraumatic.  Eyes:     Extraocular Movements: Extraocular movements intact.     Pupils: Pupils are equal, round, and reactive to light.  Cardiovascular:     Rate and Rhythm: Normal rate.  Pulmonary:     Effort: Pulmonary effort is normal.  Abdominal:     Comments: Abdomen soft, nondistended, no percussion tenderness, tenderness to palpation in the left lower quadrant; no rigidity, guarding, or rebound tenderness  Musculoskeletal:     Cervical back: Normal range of motion.     Comments: R BKA  Neurological:     General: No focal deficit present.     Mental Status: He is alert.  Psychiatric:        Mood and Affect: Mood normal.        Behavior: Behavior normal.  Results: Results for orders placed or performed during the hospital encounter of 10/11/21 (from the past 48 hour(s))  CBC with Differential     Status: Abnormal   Collection Time: 10/11/21 10:17 AM  Result Value Ref Range   WBC 20.7 (H) 4.0 - 10.5 K/uL   RBC 3.41 (L) 4.22 - 5.81 MIL/uL   Hemoglobin 10.7 (L) 13.0 - 17.0 g/dL   HCT 32.2 (L) 39.0 - 52.0 %   MCV 94.4 80.0 - 100.0 fL   MCH 31.4 26.0 - 34.0 pg   MCHC 33.2 30.0 - 36.0 g/dL   RDW 14.0 11.5 - 15.5 %   Platelets 307 150 - 400 K/uL   nRBC 0.0 0.0 - 0.2 %   Neutrophils Relative % 88 %   Neutro Abs 18.2 (H) 1.7 - 7.7 K/uL   Lymphocytes Relative 4 %   Lymphs Abs 0.9 0.7 - 4.0 K/uL    Monocytes Relative 7 %   Monocytes Absolute 1.5 (H) 0.1 - 1.0 K/uL   Eosinophils Relative 0 %   Eosinophils Absolute 0.0 0.0 - 0.5 K/uL   Basophils Relative 0 %   Basophils Absolute 0.0 0.0 - 0.1 K/uL   Immature Granulocytes 1 %   Abs Immature Granulocytes 0.12 (H) 0.00 - 0.07 K/uL    Comment: Performed at [facility], [address] [place], [place] [address]  Comprehensive metabolic panel     Status: Abnormal   Collection Time: 10/11/21 10:17 AM  Result Value Ref Range   Sodium 137 135 - 145 mmol/L   Potassium 3.4 (L) 3.5 - 5.1 mmol/L   Chloride 103 98 - 111 mmol/L   CO2 25 22 - 32 mmol/L   Glucose, Bld 175 (H) 70 - 99 mg/dL    Comment: Glucose reference range applies only to samples taken after fasting for at least 8 hours.   BUN 60 (H) 8 - 23 mg/dL   Creatinine, Ser 2.34 (H) 0.61 - 1.24 mg/dL   Calcium 8.8 (L) 8.9 - 10.3 mg/dL   Total Protein 5.9 (L) 6.5 - 8.1 g/dL   Albumin 2.9 (L) 3.5 - 5.0 g/dL   AST 26 15 - 41 U/L   ALT 31 0 - 44 U/L   Alkaline Phosphatase 89 38 - 126 U/L   Total Bilirubin 0.7 0.3 - 1.2 mg/dL   GFR, Estimated 25 (L) >60 mL/min    Comment: (NOTE) Calculated using the CKD-EPI Creatinine Equation (2021)    Anion gap 9 5 - 15    Comment: Performed at [facility], [address] [place], [place] [address]  Lipase, blood     Status: None   Collection Time: 10/11/21 10:17 AM  Result Value Ref Range   Lipase 18 11 - 51 U/L    Comment: Performed at [facility], [address], [place],  [address]  Glucose, capillary     Status: Abnormal   Collection Time: 10/11/21  5:11 PM  Result Value Ref Range   Glucose-Capillary 140 (H) 70 - 99 mg/dL    Comment: Glucose reference range applies only to samples taken after fasting for at least 8 hours.  Glucose, capillary     Status: Abnormal   Collection Time: 10/11/21 10:12 PM  Result Value Ref Range   Glucose-Capillary 122 (H) 70 - 99 mg/dL    Comment: Glucose reference range applies only to samples  taken after fasting for at least 8 hours.  Basic metabolic panel     Status: Abnormal  Collection Time: 10/12/21  4:30 AM  Result Value Ref Range   Sodium 139 135 - 145 mmol/L   Potassium 3.5 3.5 - 5.1 mmol/L   Chloride 110 98 - 111 mmol/L   CO2 23 22 - 32 mmol/L   Glucose, Bld 113 (H) 70 - 99 mg/dL    Comment: Glucose reference range applies only to samples taken after fasting for at least 8 hours.   BUN 55 (H) 8 - 23 mg/dL   Creatinine, Ser 1.92 (H) 0.61 - 1.24 mg/dL   Calcium 8.3 (L) 8.9 - 10.3 mg/dL   GFR, Estimated 31 (L) >60 mL/min    Comment: (NOTE) Calculated using the CKD-EPI Creatinine Equation (2021)    Anion gap 6 5 - 15    Comment: Performed at [facility], [address] [place], [place] [address]  CBC     Status: Abnormal   Collection Time: 10/12/21  4:30 AM  Result Value Ref Range   WBC 17.9 (H) 4.0 - 10.5 K/uL   RBC 3.24 (L) 4.22 - 5.81 MIL/uL   Hemoglobin 10.1 (L) 13.0 - 17.0 g/dL   HCT 31.1 (L) 39.0 - 52.0 %   MCV 96.0 80.0 - 100.0 fL   MCH 31.2 26.0 - 34.0 pg   MCHC 32.5 30.0 - 36.0 g/dL   RDW 14.0 11.5 - 15.5 %   Platelets 278 150 - 400 K/uL   nRBC 0.0 0.0 - 0.2 %    Comment: Performed at [facility], [address], [place], [place] [address]  Glucose, capillary     Status: Abnormal   Collection Time: 10/12/21  7:16 AM  Result Value Ref Range   Glucose-Capillary 109 (H) 70 - 99 mg/dL    Comment: Glucose reference range applies only to samples taken after fasting for at least 8 hours.  Glucose, capillary     Status: Abnormal   Collection Time: 10/12/21 11:25 AM  Result Value Ref Range   Glucose-Capillary 167 (H) 70 - 99 mg/dL    Comment: Glucose reference range applies only to samples taken after fasting for at least 8 hours.    CT ABDOMEN PELVIS WO CONTRAST  Result Date: 10/11/2021 CLINICAL DATA:  Abdominal pain and vomiting for several days. Coffee-ground emesis. EXAM: CT ABDOMEN AND PELVIS WITHOUT CONTRAST TECHNIQUE: Multidetector CT imaging of  the abdomen and pelvis was performed following the standard protocol without IV contrast. RADIATION DOSE REDUCTION: This exam was performed according to the departmental dose-optimization program which includes automated exposure control, adjustment of the mA and/or kV according to patient size and/or use of iterative reconstruction technique. COMPARISON:  11/15/2018 FINDINGS: Lower chest: New tiny right pleural effusion. Stable bilateral lower lobe scarring and mild bronchiectasis. Hepatobiliary: No mass visualized on this unenhanced exam. Prior cholecystectomy. No evidence of biliary obstruction. Pancreas: No mass or inflammatory process visualized on this unenhanced exam. Spleen:  Within normal limits in size. Adrenals/Urinary tract: No evidence of urolithiasis or hydronephrosis. Unremarkable unopacified urinary bladder. Stomach/Bowel: Small hiatal hernia again noted. Evaluation of bowel limited by lack of IV and oral contrast. Severe sigmoid diverticulosis is seen. Mild-to-moderate colonic wall thickening is seen involving the mid sigmoid colon with adjacent pericolonic soft tissue stranding, consistent with mild diverticulitis. A focal pericolonic collection is seen in the left pelvis measuring 3.1 x 2.8 cm, suspicious for a small pericolonic abscess. No evidence of bowel obstruction or free intraperitoneal air. Vascular/Lymphatic: No pathologically enlarged lymph nodes identified. No evidence of abdominal aortic aneurysm. Aortic atherosclerotic calcification  incidentally noted. Reproductive: Moderately enlarged prostate gland, without significant change. Other:  None. Musculoskeletal:  No suspicious bone lesions identified. IMPRESSION: Probable sigmoid diverticulitis with small pericolonic abscess. Recommend continued follow-up by abdomen pelvis CT with IV contrast. New tiny right pleural effusion. Stable small hiatal hernia. Stable moderately enlarged prostate. Aortic Atherosclerosis (ICD10-I70.0).  Electronically Signed   By: Marlaine Hind M.D.   On: 10/11/2021 10:57     Assessment & Plan:  Riley Griffith is a 88 y.o. male who was admitted with sigmoid diverticulitis with a 3.1 x 2.8 cm pericolonic abscess.  WBC 20.7. Imaging and blood work evaluated by myself.  -Leukocytosis improved to 17.9 from 20.7 -Continue IV Zosyn -Given the small size of the pericolonic abscess, I would recommend against consultation of IR for aspiration or drainage at this time -Recommend conservative management with slow advancement of diet, IV antibiotics, and IV fluids -If patient fails to progress, would recommend CT abdomen and pelvis with IV contrast to reevaluate abscess, and consider IR consultation at that time -No need for surgical intervention at this time -Diet changed to full liquid diet -Appreciate hospitalist recommendations  All questions were answered to the satisfaction of the patient.  -- Graciella Freer, DO [facility] [address] [place], [place] [address] [phone] (office)

## 2021-10-12 NOTE — Plan of Care (Signed)

## 2021-10-12 NOTE — Progress Notes (Signed)
Pharmacy Antibiotic Note  Riley Griffith is a 88 y.o. male admitted on 10/11/2021 with  intra-abdominal infxn .  Pharmacy has been consulted for Zosyn dosing. Scr improved  Plan: Change Zosyn 3.375gm IV q8hrs EID over 4 hours Monitor V/S, labs , cxs and clinical progress  Height: '5\' 11"'$  (180.3 cm) Weight: 77.1 kg (169 lb 15.6 oz) IBW/kg (Calculated) : 75.3  Temp (24hrs), Avg:98.2 F (36.8 C), Min:98 F (36.7 C), Max:98.4 F (36.9 C)  Recent Labs  Lab 10/11/21 1017 10/12/21 0430  WBC 20.7* 17.9*  CREATININE 2.34* 1.92*     Estimated Creatinine Clearance: 24 mL/min (A) (by C-G formula based on SCr of 1.92 mg/dL (H)).    No Known Allergies  Antimicrobials this admission: Zosyn 6/18 >>   Thank you for allowing pharmacy to be a part of this patient's care.  Isac Sarna, BS Pharm D, BCPS Clinical Pharmacist 10/12/2021 8:15 AM

## 2021-10-12 NOTE — TOC Progression Note (Signed)
  Transition of Care [facility]) Screening Note   Patient Details  Name: Riley Griffith Date of Birth: Oct 11, 1923   Transition of Care [facility]) CM/SW Contact:    Boneta Lucks, RN Phone Number: 10/12/2021, 1:28 PM    Transition of Care Department [facility]) has reviewed patient and no TOC needs have been identified at this time. We will continue to monitor patient advancement through interdisciplinary progression rounds. If new patient transition needs arise, please place a TOC consult.      Expected Discharge Plan: [place] Barriers to Discharge: Continued Medical Work up  Expected Discharge Plan and Services Expected Discharge Plan: [place]

## 2021-10-12 NOTE — Progress Notes (Signed)
PROGRESS NOTE   Riley Griffith, is a 88 y.o. male, DOB - 1923/04/30, NGE:[id number]  Admit date - 10/11/2021   Admitting Physician Nyara Capell Denton Brick, MD  Outpatient Primary MD for the patient is Riley Squibb, MD  LOS - 1  Chief Complaint  Patient presents with   Emesis      Brief Narrative:  88 y.o. male   with a pmhx significant for DM, HTN, CKD 3B, GERD and osteomyelitis of right foot (s/p BKA), as well as hearing loss and chronic anemia of CKD admitted on 10/11/2021 with sigmoid diverticulitis with pericolonic abscess    -Assessment and Plan: 1)Sigmoid Diverticulitis with Small Pericolonic Abscess--CT abdomen and pelvis finding noted -EDP discussed with general surgeon  who recommends antibiotics and nonsurgical management at this time WBC 20.7 >>17.9 -IV Zosyn, IV fluids Had nausea and emesis -Abdominal pain improving --As needed antiemetics and pain medications   2) CKD stage -3B - creatinine on admission= 2.34 , baseline creatinine in the 1.9 to 2.2 range --hold Lasix, hold lisinopril--due to concerns about emesis and risk for dehydration/AKI, -Creatinine improving with gentle IV fluids - renally adjust medications, avoid nephrotoxic agents / dehydration  / hypotension   3)Anemia of CKD--Hgb around 10,, close to baseline -No evidence of ongoing bleeding -Monitor closely   4)Hypokalemia--suspect due to emesis/GI losses , replace and recheck   5)DM2-continue reduced dose Lantus insulin(12 units nightly) pending better tolerance of oral intake Use Novolog/Humalog Sliding scale insulin with Accu-Cheks/Fingersticks as ordered    6)HTN--hold Lasix, hold lisinopril--due to concerns about emesis and risk for dehydration/AKI, -- continue amlodipine   7)GERD--continue Protonix especially given recurrent emesis   8)BPH---?? LUTS... Difficulty with urination, C/n  Flomax  -okay to do in and out catheterization if urinary retention persist   9)PI---POA--- pressure injury from  right BKA prosthesis -right knee area with abrasion from prosthesis -Wound care consult requested   Disposition/Need for in-Hospital Stay- patient unable to be discharged at this time due to sigmoid of the colitis with abscess requiring IV antibiotics, may need surgical intervention if fails to improve with antibiotics   Status is: Inpatient   Remains inpatient appropriate because:    Dispo: The patient is from: Home              Anticipated d/c is to: Home              Anticipated d/c date is: 2 days              Patient currently is not medically stable to d/c. Barriers: Not Clinically Stable-   Code Status : -  Code Status: Full Code   Family Communication:    (patient is alert, awake and coherent)  Discussed with son  DVT Prophylaxis  :   - SCDs   heparin injection 5,000 Units Start: 10/11/21 1615 SCDs Start: 10/11/21 1521 Place TED hose Start: 10/11/21 1521   Lab Results  Component Value Date   PLT 278 10/12/2021   Inpatient Medications  Scheduled Meds:  amLODipine  5 mg Oral QHS   Gerhardt's butt cream   Topical BID   heparin  5,000 Units Subcutaneous Q8H   insulin aspart  0-5 Units Subcutaneous QHS   insulin aspart  0-6 Units Subcutaneous TID WC   insulin glargine-yfgn  12 Units Subcutaneous QHS   pantoprazole (PROTONIX) IV  40 mg Intravenous QHS   prochlorperazine  25 mg Rectal Once   sodium bicarbonate  650 mg Oral BID   sodium  chloride flush  3 mL Intravenous Q12H   sodium chloride flush  3 mL Intravenous Q12H   tamsulosin  0.4 mg Oral PC supper   Continuous Infusions:  sodium chloride     sodium chloride     piperacillin-tazobactam (ZOSYN)  IV     PRN Meds:.sodium chloride, acetaminophen **OR** acetaminophen, fentaNYL (SUBLIMAZE) injection, LORazepam, ondansetron **OR** ondansetron (ZOFRAN) IV, oxyCODONE, sodium chloride flush, traZODone   Anti-infectives (From admission, onward)    Start     Dose/Rate Route Frequency Ordered Stop   10/12/21 1400   piperacillin-tazobactam (ZOSYN) IVPB 3.375 g        3.375 g 12.5 mL/hr over 240 Minutes Intravenous Every 8 hours 10/12/21 0930     10/11/21 2000  piperacillin-tazobactam (ZOSYN) IVPB 2.25 g  Status:  Discontinued        2.25 g 100 mL/hr over 30 Minutes Intravenous Every 8 hours 10/11/21 1250 10/12/21 0930   10/11/21 1115  piperacillin-tazobactam (ZOSYN) IVPB 3.375 g        3.375 g 100 mL/hr over 30 Minutes Intravenous  Once 10/11/21 1105 10/11/21 1256         Subjective: Carloyn Manner today has no fevers,   No chest pain,    Nausea and emesis persist -Abdominal pain improving -Tolerating oral liquids well   Objective: Vitals:   10/11/21 1429 10/11/21 2210 10/12/21 0500 10/12/21 0700  BP: (!) 110/51 (!) 104/54 (!) 117/56 (!) 111/56  Pulse: 92 88 86 84  Resp: '17 19 19 20  '$ Temp: 98.4 F (36.9 C) 98 F (36.7 C) 98.2 F (36.8 C) 98.3 F (36.8 C)  TempSrc:  Oral Oral Oral  SpO2: 92% 96% 93% 94%  Weight:      Height:        Intake/Output Summary (Last 24 hours) at 10/12/2021 1145 Last data filed at 10/12/2021 0900 Gross per 24 hour  Intake 1245.61 ml  Output 300 ml  Net 945.61 ml   Filed Weights   10/11/21 0929  Weight: 77.1 kg    Physical Exam  Physical Examination: General appearance - alert,  in no distress  Mental status - alert, oriented to person, place, and time,  Eyes - sclera anicteric Ears--HOH Neck - supple, no JVD elevation , Chest - clear  to auscultation bilaterally, symmetrical air movement,  Heart - S1 and S2 normal, regular  Abdomen - soft, Not distended, +BS, lower abdominal tenderness, no rebound or guarding, no CVA area tenderness Neurological - screening mental status exam normal, neck supple without rigidity, cranial nerves II through XII intact, DTR's normal and symmetric Skin - warm, dry MSK-right BKA stump appears well-healed, right knee area with abrasion from prosthesis  Data Reviewed: I have personally reviewed following labs and  imaging studies  CBC: Recent Labs  Lab 10/11/21 1017 10/12/21 0430  WBC 20.7* 17.9*  NEUTROABS 18.2*  --   HGB 10.7* 10.1*  HCT 32.2* 31.1*  MCV 94.4 96.0  PLT 307 381   Basic Metabolic Panel: Recent Labs  Lab 10/11/21 1017 10/12/21 0430  NA 137 139  K 3.4* 3.5  CL 103 110  CO2 25 23  GLUCOSE 175* 113*  BUN 60* 55*  CREATININE 2.34* 1.92*  CALCIUM 8.8* 8.3*   GFR: Estimated Creatinine Clearance: 24 mL/min (A) (by C-G formula based on SCr of 1.92 mg/dL (H)). Liver Function Tests: Recent Labs  Lab 10/11/21 1017  AST 26  ALT 31  ALKPHOS 89  BILITOT 0.7  PROT 5.9*  ALBUMIN 2.9*  Cardiac Enzymes: No results for input(s): "CKTOTAL", "CKMB", "CKMBINDEX", "TROPONINI" in the last 168 hours. BNP (last 3 results) No results for input(s): "PROBNP" in the last 8760 hours. HbA1C: No results for input(s): "HGBA1C" in the last 72 hours. Sepsis Labs: '@LABRCNTIP'$ (procalcitonin:4,lacticidven:4) )No results found for this or any previous visit (from the past 240 hour(s)).    Radiology Studies: CT ABDOMEN PELVIS WO CONTRAST  Result Date: 10/11/2021 CLINICAL DATA:  Abdominal pain and vomiting for several days. Coffee-ground emesis. EXAM: CT ABDOMEN AND PELVIS WITHOUT CONTRAST TECHNIQUE: Multidetector CT imaging of the abdomen and pelvis was performed following the standard protocol without IV contrast. RADIATION DOSE REDUCTION: This exam was performed according to the departmental dose-optimization program which includes automated exposure control, adjustment of the mA and/or kV according to patient size and/or use of iterative reconstruction technique. COMPARISON:  11/15/2018 FINDINGS: Lower chest: New tiny right pleural effusion. Stable bilateral lower lobe scarring and mild bronchiectasis. Hepatobiliary: No mass visualized on this unenhanced exam. Prior cholecystectomy. No evidence of biliary obstruction. Pancreas: No mass or inflammatory process visualized on this unenhanced  exam. Spleen:  Within normal limits in size. Adrenals/Urinary tract: No evidence of urolithiasis or hydronephrosis. Unremarkable unopacified urinary bladder. Stomach/Bowel: Small hiatal hernia again noted. Evaluation of bowel limited by lack of IV and oral contrast. Severe sigmoid diverticulosis is seen. Mild-to-moderate colonic wall thickening is seen involving the mid sigmoid colon with adjacent pericolonic soft tissue stranding, consistent with mild diverticulitis. A focal pericolonic collection is seen in the left pelvis measuring 3.1 x 2.8 cm, suspicious for a small pericolonic abscess. No evidence of bowel obstruction or free intraperitoneal air. Vascular/Lymphatic: No pathologically enlarged lymph nodes identified. No evidence of abdominal aortic aneurysm. Aortic atherosclerotic calcification incidentally noted. Reproductive: Moderately enlarged prostate gland, without significant change. Other:  None. Musculoskeletal:  No suspicious bone lesions identified. IMPRESSION: Probable sigmoid diverticulitis with small pericolonic abscess. Recommend continued follow-up by abdomen pelvis CT with IV contrast. New tiny right pleural effusion. Stable small hiatal hernia. Stable moderately enlarged prostate. Aortic Atherosclerosis (ICD10-I70.0). Electronically Signed   By: Marlaine Hind M.D.   On: 10/11/2021 10:57     Scheduled Meds:  amLODipine  5 mg Oral QHS   Gerhardt's butt cream   Topical BID   heparin  5,000 Units Subcutaneous Q8H   insulin aspart  0-5 Units Subcutaneous QHS   insulin aspart  0-6 Units Subcutaneous TID WC   insulin glargine-yfgn  12 Units Subcutaneous QHS   pantoprazole (PROTONIX) IV  40 mg Intravenous QHS   prochlorperazine  25 mg Rectal Once   sodium bicarbonate  650 mg Oral BID   sodium chloride flush  3 mL Intravenous Q12H   sodium chloride flush  3 mL Intravenous Q12H   tamsulosin  0.4 mg Oral PC supper   Continuous Infusions:  sodium chloride     sodium chloride      piperacillin-tazobactam (ZOSYN)  IV      LOS: 1 day    Roxan Hockey M.D on 10/12/2021 at 11:45 AM  Go to [email or web address] - for contact info  [facility] - Office  [phone]  If 7PM-7AM, please contact night-coverage [email or web address] 10/12/2021, 11:45 AM

## 2021-10-12 NOTE — Progress Notes (Signed)
At 1342 bladder scanned patient due to patient not having much output, bladder scan showed 871, at 1430 [place] placed 14 french foley with nursing instructor,(Manning) this nurse was not in room at the time of procedure. Nursing instructor stated to this nurse that foley was placed.

## 2021-10-13 DIAGNOSIS — K5792 Diverticulitis of intestine, part unspecified, without perforation or abscess without bleeding: Secondary | ICD-10-CM | POA: Diagnosis not present

## 2021-10-13 LAB — CBC
HCT: 29.8 % — ABNORMAL LOW (ref 39.0–52.0)
Hemoglobin: 9.6 g/dL — ABNORMAL LOW (ref 13.0–17.0)
MCH: 31.1 pg (ref 26.0–34.0)
MCHC: 32.2 g/dL (ref 30.0–36.0)
MCV: 96.4 fL (ref 80.0–100.0)
Platelets: 280 10*3/uL (ref 150–400)
RBC: 3.09 MIL/uL — ABNORMAL LOW (ref 4.22–5.81)
RDW: 14.2 % (ref 11.5–15.5)
WBC: 16.5 10*3/uL — ABNORMAL HIGH (ref 4.0–10.5)
nRBC: 0 % (ref 0.0–0.2)

## 2021-10-13 LAB — RENAL FUNCTION PANEL
Albumin: 2.1 g/dL — ABNORMAL LOW (ref 3.5–5.0)
Anion gap: 4 — ABNORMAL LOW (ref 5–15)
BUN: 55 mg/dL — ABNORMAL HIGH (ref 8–23)
CO2: 22 mmol/L (ref 22–32)
Calcium: 8.1 mg/dL — ABNORMAL LOW (ref 8.9–10.3)
Chloride: 112 mmol/L — ABNORMAL HIGH (ref 98–111)
Creatinine, Ser: 1.99 mg/dL — ABNORMAL HIGH (ref 0.61–1.24)
GFR, Estimated: 30 mL/min — ABNORMAL LOW (ref >60)
Glucose, Bld: 136 mg/dL — ABNORMAL HIGH (ref 70–99)
Phosphorus: 3.4 mg/dL (ref 2.5–4.6)
Potassium: 3.6 mmol/L (ref 3.5–5.1)
Sodium: 138 mmol/L (ref 135–145)

## 2021-10-13 LAB — GLUCOSE, CAPILLARY
Glucose-Capillary: 108 mg/dL — ABNORMAL HIGH (ref 70–99)
Glucose-Capillary: 170 mg/dL — ABNORMAL HIGH (ref 70–99)
Glucose-Capillary: 189 mg/dL — ABNORMAL HIGH (ref 70–99)
Glucose-Capillary: 140 mg/dL — ABNORMAL HIGH (ref 70–99)

## 2021-10-13 NOTE — Consult Note (Signed)
WOC Nurse Consult Note: Patient receiving care in [facility] 316. Reason for Consult: R knee wounds Orders are in the record since 6/18 for the right knee wounds. Please follow those instructions. [place] nurse will not follow at this time.  Please re-consult the [place] team if needed.  Val Riles, RN, MSN, CWOCN, CNS-BC, pager [phone]

## 2021-10-13 NOTE — Progress Notes (Signed)
[facility] Progress Note     Subjective: Patient seen and examined.  He is resting comfortably.  He has no complaints at this time.  He denies nausea, vomiting, and worsening abdominal pain.  He was able to tolerate his oatmeal this morning without nausea, vomiting, and abdominal pain.  He had a bowel movement this morning.  He denies fevers and chills.  Objective: Vital signs in last 24 hours: Temp:  [98.1 F (36.7 C)] 98.1 F (36.7 C) (06/20 0615) Pulse Rate:  [85-91] 85 (06/20 0615) Resp:  [20] 20 (06/20 0615) BP: (118-119)/(60-64) 118/60 (06/20 0615) SpO2:  [95 %] 95 % (06/20 0615) Last BM Date : 10/13/21  Intake/Output from previous day: 06/19 0701 - 06/20 0700 In: 4434.1 [P.O.:1080; I.V.:3254.1; IV Piggyback:100] Out: 1150 [Urine:1150] Intake/Output this shift: No intake/output data recorded.  General appearance: alert, cooperative, and no distress GI: Abdomen soft, nondistended, no percussion tenderness, mild tenderness to palpation in the left lower quadrant; no rigidity, guarding, rebound tenderness  Lab Results:  Recent Labs    10/12/21 0430 10/13/21 0439  WBC 17.9* 16.5*  HGB 10.1* 9.6*  HCT 31.1* 29.8*  PLT 278 280   BMET Recent Labs    10/12/21 0430 10/13/21 0439  NA 139 138  K 3.5 3.6  CL 110 112*  CO2 23 22  GLUCOSE 113* 136*  BUN 55* 55*  CREATININE 1.92* 1.99*  CALCIUM 8.3* 8.1*   PT/INR No results for input(s): "LABPROT", "INR" in the last 72 hours.  Studies/Results: No results found.  Anti-infectives: Anti-infectives (From admission, onward)    Start     Dose/Rate Route Frequency Ordered Stop   10/12/21 1400  piperacillin-tazobactam (ZOSYN) IVPB 3.375 g        3.375 g 12.5 mL/hr over 240 Minutes Intravenous Every 8 hours 10/12/21 0930     10/11/21 2000  piperacillin-tazobactam (ZOSYN) IVPB 2.25 g  Status:  Discontinued        2.25 g 100 mL/hr over 30 Minutes Intravenous Every 8 hours 10/11/21 1250 10/12/21 0930    10/11/21 1115  piperacillin-tazobactam (ZOSYN) IVPB 3.375 g        3.375 g 100 mL/hr over 30 Minutes Intravenous  Once 10/11/21 1105 10/11/21 1256       Assessment/Plan:  Patient is a 88 year old male who was admitted with sigmoid diverticulitis with a 3.1 x 2.8 cm pericolonic abscess.  -Leukocytosis improved to 16.5 from 17.9 -Continue IV Zosyn -Diet advanced to GI soft -We will see how the patient progresses over the next couple of days. His creatinine still remains elevated at 1.99 today.  If he were to undergo repeat CT abdomen and pelvis, I would recommend that this be performed with IV contrast to fully evaluate the abscess and bowel wall -No acute surgical intervention at this time -Appreciate hospitalist recommendations   LOS: 2 days    Aliciana Ricciardi A Aliciana Ricciardi 10/13/2021

## 2021-10-13 NOTE — Progress Notes (Signed)
PROGRESS NOTE   Riley Griffith, is a 88 y.o. male, DOB - Dec 07, 1923, RFF:[id number]  Admit date - 10/11/2021   Admitting Physician Rome Schlauch Denton Brick, MD  Outpatient Primary MD for the patient is [name], MD  LOS - 2  Chief Complaint  Patient presents with   Emesis      Brief Narrative:  88 y.o. male   with a pmhx significant for DM, HTN, CKD 3B, GERD and osteomyelitis of right foot (s/p BKA), as well as hearing loss and chronic anemia of CKD admitted on 10/11/2021 with sigmoid diverticulitis with pericolonic abscess    -Assessment and Plan: 1)Sigmoid Diverticulitis with Small Pericolonic Abscess--CT abdomen and pelvis finding noted -EDP discussed with general surgeon  who recommends antibiotics and nonsurgical management at this time WBC 20.7 >>17.9>>16.5 -IV Zosyn, IV fluids Tolerating liquids better, nausea improved -Abdominal pain improving --As needed antiemetics and pain medications -Defer timing of possible repeat CT abdomen and pelvis to general surgeon   2) CKD stage -3B - creatinine on admission= 2.34 , - baseline creatinine in the 1.9 to 2.2 range --Continue to hold Lasix, hold lisinopril--due to concerns about emesis and risk for dehydration/AKI, -Creatinine improving with gentle IV fluids - renally adjust medications, avoid nephrotoxic agents / dehydration  / hypotension   3)Anemia of CKD--Hgb above 9, suspect some component of hemodilution Hgb was close to baseline on admission -No evidence of ongoing bleeding -Monitor closely   4)Hypokalemia--suspect due to emesis/GI losses , resolved with replacement  5)DM2-continue reduced dose Lantus insulin(12 units nightly) pending better tolerance of oral intake Use Novolog/Humalog Sliding scale insulin with Accu-Cheks/Fingersticks as ordered    6)HTN--stable BP, hold Lasix, hold lisinopril--due to concerns about emesis and risk for dehydration/AKI, -- continue amlodipine   7)GERD--continue Protonix especially given  recurrent emesis   8)BPH---?? LUTS... Difficulty with urination, -Urinary retention worsened, Foley catheter placed on 10/12/2021 C/n  Flomax  -Attempt voiding trial on 10/14/2021, - if patient fails to voiding trial he will need to follow-up with urology as outpatient and keep Foley in for now   9)PI---POA--- pressure injury from right BKA prosthesis -right knee area with abrasion from prosthesis -Wound care consult requested   Disposition/Need for in-Hospital Stay- patient unable to be discharged at this time due to sigmoid of the colitis with abscess requiring IV antibiotics, may need surgical intervention if fails to improve with antibiotics -defer to general surgeon timing of repeat CT abdomen and pelvis)   Status is: Inpatient   Remains inpatient appropriate because:    Dispo: The patient is from: Home              Anticipated d/c is to: Home              Anticipated d/c date is: 2 days (defer to general surgeon timing of repeat CT abdomen and pelvis)              Patient currently is not medically stable to d/c. Barriers: Not Clinically Stable-   Code Status : -  Code Status: Full Code   Family Communication:    (patient is alert, awake and coherent)  Discussed with son  DVT Prophylaxis  :   - SCDs   heparin injection 5,000 Units Start: 10/11/21 1615 SCDs Start: 10/11/21 1521 Place TED hose Start: 10/11/21 1521   Lab Results  Component Value Date   PLT 280 10/13/2021   Inpatient Medications  Scheduled Meds:  amLODipine  5 mg Oral QHS   Chlorhexidine  Gluconate Cloth  6 each Topical Daily   Gerhardt's butt cream   Topical BID   heparin  5,000 Units Subcutaneous Q8H   insulin aspart  0-5 Units Subcutaneous QHS   insulin aspart  0-6 Units Subcutaneous TID WC   insulin glargine-yfgn  12 Units Subcutaneous QHS   pantoprazole (PROTONIX) IV  40 mg Intravenous QHS   sodium bicarbonate  650 mg Oral BID   sodium chloride flush  3 mL Intravenous Q12H   tamsulosin  0.4 mg  Oral PC supper   Continuous Infusions:  piperacillin-tazobactam (ZOSYN)  IV 3.375 g (10/13/21 0504)   PRN Meds:.acetaminophen **OR** acetaminophen, fentaNYL (SUBLIMAZE) injection, LORazepam, ondansetron **OR** ondansetron (ZOFRAN) IV, oxyCODONE, traZODone   Anti-infectives (From admission, onward)    Start     Dose/Rate Route Frequency Ordered Stop   10/12/21 1400  piperacillin-tazobactam (ZOSYN) IVPB 3.375 g        3.375 g 12.5 mL/hr over 240 Minutes Intravenous Every 8 hours 10/12/21 0930     10/11/21 2000  piperacillin-tazobactam (ZOSYN) IVPB 2.25 g  Status:  Discontinued        2.25 g 100 mL/hr over 30 Minutes Intravenous Every 8 hours 10/11/21 1250 10/12/21 0930   10/11/21 1115  piperacillin-tazobactam (ZOSYN) IVPB 3.375 g        3.375 g 100 mL/hr over 30 Minutes Intravenous  Once 10/11/21 1105 10/11/21 1256         Subjective: Carloyn Manner today has no fevers,   No chest pain,    Tolerated liquids, had BM, -Ambulated in the hallways with staff with prosthesis  Objective: Vitals:   10/12/21 0700 10/12/21 1240 10/12/21 2125 10/13/21 0615  BP: (!) 111/56 111/65 119/64 118/60  Pulse: 84 94 91 85  Resp: '20 17 20 20  '$ Temp: 98.3 F (36.8 C) 98.6 F (37 C) 98.1 F (36.7 C) 98.1 F (36.7 C)  TempSrc: Oral  Oral Oral  SpO2: 94% 96% 95% 95%  Weight:      Height:        Intake/Output Summary (Last 24 hours) at 10/13/2021 4097 Last data filed at 10/13/2021 0434 Gross per 24 hour  Intake 4194.1 ml  Output 1150 ml  Net 3044.1 ml   Filed Weights   10/11/21 0929  Weight: 77.1 kg    Physical Exam  Physical Examination: General appearance - alert,  in no distress  Mental status - alert, oriented to person, place, and time,  Eyes - sclera anicteric Ears--HOH Neck - supple, no JVD elevation , Chest - clear  to auscultation bilaterally, symmetrical air movement,  Heart - S1 and S2 normal, regular  Abdomen - soft, Not distended, +BS, lower abdominal tenderness is  not worse, no rebound or guarding, no CVA area tenderness Neurological - screening mental status exam normal, neck supple without rigidity, cranial nerves II through XII intact, DTR's normal and symmetric Skin - warm, dry MSK-right BKA stump appears well-healed, right knee area with abrasion/wounds from prosthesis rubbing against it  GU-Foley catheter placed on 10/12/2021 for urinary retention  Data Reviewed: I have personally reviewed following labs and imaging studies  CBC: Recent Labs  Lab 10/11/21 1017 10/12/21 0430 10/13/21 0439  WBC 20.7* 17.9* 16.5*  NEUTROABS 18.2*  --   --   HGB 10.7* 10.1* 9.6*  HCT 32.2* 31.1* 29.8*  MCV 94.4 96.0 96.4  PLT 307 278 353   Basic Metabolic Panel: Recent Labs  Lab 10/11/21 1017 10/12/21 0430 10/13/21 0439  NA 137 139 138  K 3.4* 3.5 3.6  CL 103 110 112*  CO2 '25 23 22  '$ GLUCOSE 175* 113* 136*  BUN 60* 55* 55*  CREATININE 2.34* 1.92* 1.99*  CALCIUM 8.8* 8.3* 8.1*  PHOS  --   --  3.4   GFR: Estimated Creatinine Clearance: 23.1 mL/min (A) (by C-G formula based on SCr of 1.99 mg/dL (H)). Liver Function Tests: Recent Labs  Lab 10/11/21 1017 10/13/21 0439  AST 26  --   ALT 31  --   ALKPHOS 89  --   BILITOT 0.7  --   PROT 5.9*  --   ALBUMIN 2.9* 2.1*   Cardiac Enzymes: No results for input(s): "CKTOTAL", "CKMB", "CKMBINDEX", "TROPONINI" in the last 168 hours. BNP (last 3 results) No results for input(s): "PROBNP" in the last 8760 hours. HbA1C: No results for input(s): "HGBA1C" in the last 72 hours. Sepsis Labs: '@LABRCNTIP'$ (procalcitonin:4,lacticidven:4) )No results found for this or any previous visit (from the past 240 hour(s)).    Radiology Studies: CT ABDOMEN PELVIS WO CONTRAST  Result Date: 10/11/2021 CLINICAL DATA:  Abdominal pain and vomiting for several days. Coffee-ground emesis. EXAM: CT ABDOMEN AND PELVIS WITHOUT CONTRAST TECHNIQUE: Multidetector CT imaging of the abdomen and pelvis was performed following the  standard protocol without IV contrast. RADIATION DOSE REDUCTION: This exam was performed according to the departmental dose-optimization program which includes automated exposure control, adjustment of the mA and/or kV according to patient size and/or use of iterative reconstruction technique. COMPARISON:  11/15/2018 FINDINGS: Lower chest: New tiny right pleural effusion. Stable bilateral lower lobe scarring and mild bronchiectasis. Hepatobiliary: No mass visualized on this unenhanced exam. Prior cholecystectomy. No evidence of biliary obstruction. Pancreas: No mass or inflammatory process visualized on this unenhanced exam. Spleen:  Within normal limits in size. Adrenals/Urinary tract: No evidence of urolithiasis or hydronephrosis. Unremarkable unopacified urinary bladder. Stomach/Bowel: Small hiatal hernia again noted. Evaluation of bowel limited by lack of IV and oral contrast. Severe sigmoid diverticulosis is seen. Mild-to-moderate colonic wall thickening is seen involving the mid sigmoid colon with adjacent pericolonic soft tissue stranding, consistent with mild diverticulitis. A focal pericolonic collection is seen in the left pelvis measuring 3.1 x 2.8 cm, suspicious for a small pericolonic abscess. No evidence of bowel obstruction or free intraperitoneal air. Vascular/Lymphatic: No pathologically enlarged lymph nodes identified. No evidence of abdominal aortic aneurysm. Aortic atherosclerotic calcification incidentally noted. Reproductive: Moderately enlarged prostate gland, without significant change. Other:  None. Musculoskeletal:  No suspicious bone lesions identified. IMPRESSION: Probable sigmoid diverticulitis with small pericolonic abscess. Recommend continued follow-up by abdomen pelvis CT with IV contrast. New tiny right pleural effusion. Stable small hiatal hernia. Stable moderately enlarged prostate. Aortic Atherosclerosis (ICD10-I70.0). Electronically Signed   By: Marlaine Hind M.D.   On:  10/11/2021 10:57     Scheduled Meds:  amLODipine  5 mg Oral QHS   Chlorhexidine Gluconate Cloth  6 each Topical Daily   Gerhardt's butt cream   Topical BID   heparin  5,000 Units Subcutaneous Q8H   insulin aspart  0-5 Units Subcutaneous QHS   insulin aspart  0-6 Units Subcutaneous TID WC   insulin glargine-yfgn  12 Units Subcutaneous QHS   pantoprazole (PROTONIX) IV  40 mg Intravenous QHS   sodium bicarbonate  650 mg Oral BID   sodium chloride flush  3 mL Intravenous Q12H   tamsulosin  0.4 mg Oral PC supper   Continuous Infusions:  piperacillin-tazobactam (ZOSYN)  IV 3.375 g (10/13/21 0504)    LOS: 2 days  Roxan Hockey M.D on 10/13/2021 at 9:33 AM  Go to [email or web address] - for contact info  [facility] - Office  [phone]  If 7PM-7AM, please contact night-coverage [email or web address] 10/13/2021, 9:33 AM

## 2021-10-14 DIAGNOSIS — K5792 Diverticulitis of intestine, part unspecified, without perforation or abscess without bleeding: Secondary | ICD-10-CM | POA: Diagnosis not present

## 2021-10-14 LAB — COMPREHENSIVE METABOLIC PANEL
ALT: 24 U/L (ref 0–44)
AST: 20 U/L (ref 15–41)
Albumin: 2 g/dL — ABNORMAL LOW (ref 3.5–5.0)
Alkaline Phosphatase: 75 U/L (ref 38–126)
Anion gap: 6 (ref 5–15)
BUN: 50 mg/dL — ABNORMAL HIGH (ref 8–23)
CO2: 20 mmol/L — ABNORMAL LOW (ref 22–32)
Calcium: 8 mg/dL — ABNORMAL LOW (ref 8.9–10.3)
Chloride: 113 mmol/L — ABNORMAL HIGH (ref 98–111)
Creatinine, Ser: 1.84 mg/dL — ABNORMAL HIGH (ref 0.61–1.24)
GFR, Estimated: 33 mL/min — ABNORMAL LOW (ref >60)
Glucose, Bld: 105 mg/dL — ABNORMAL HIGH (ref 70–99)
Potassium: 3.6 mmol/L (ref 3.5–5.1)
Sodium: 139 mmol/L (ref 135–145)
Total Bilirubin: 0.3 mg/dL (ref 0.3–1.2)
Total Protein: 4.7 g/dL — ABNORMAL LOW (ref 6.5–8.1)

## 2021-10-14 LAB — CBC
HCT: 30.7 % — ABNORMAL LOW (ref 39.0–52.0)
Hemoglobin: 9.7 g/dL — ABNORMAL LOW (ref 13.0–17.0)
MCH: 30.9 pg (ref 26.0–34.0)
MCHC: 31.6 g/dL (ref 30.0–36.0)
MCV: 97.8 fL (ref 80.0–100.0)
Platelets: 299 10*3/uL (ref 150–400)
RBC: 3.14 MIL/uL — ABNORMAL LOW (ref 4.22–5.81)
RDW: 14.2 % (ref 11.5–15.5)
WBC: 12.9 10*3/uL — ABNORMAL HIGH (ref 4.0–10.5)
nRBC: 0 % (ref 0.0–0.2)

## 2021-10-14 LAB — GLUCOSE, CAPILLARY
Glucose-Capillary: 92 mg/dL (ref 70–99)
Glucose-Capillary: 107 mg/dL — ABNORMAL HIGH (ref 70–99)
Glucose-Capillary: 138 mg/dL — ABNORMAL HIGH (ref 70–99)
Glucose-Capillary: 158 mg/dL — ABNORMAL HIGH (ref 70–99)

## 2021-10-14 MED ORDER — SENNOSIDES-DOCUSATE SODIUM 8.6-50 MG PO TABS
2.0000 | ORAL_TABLET | Freq: Two times a day (BID) | ORAL | Status: DC
  Administered 2021-10-14 – 2021-10-15 (×4): 2 via ORAL
  Filled 2021-10-14 (×5): qty 2

## 2021-10-14 NOTE — Progress Notes (Signed)
PROGRESS NOTE   Riley Griffith, is a 88 y.o. male, DOB - Apr 09, 1924, QAS:[id number]  Admit date - 10/11/2021   Admitting Physician Courage Denton Brick, MD  Outpatient Primary MD for the patient is [name], MD  LOS - 3  Chief Complaint  Patient presents with   Emesis      Brief Narrative:  Riley Griffith  is a 88 y.o. male   with a pmhx significant for DM, HTN, CKD 3B, GERD and osteomyelitis of right foot (s/p BKA), as well as hearing loss and chronic anemia of CKD admitted on 10/11/2021 with sigmoid diverticulitis with pericolonic abscess  Subjective: The patient was seen and examined laying in bed comfortable, still complaining of abdominal discomfort, pain diffusely but tolerable He is tolerating some p.o. today  -Assessment and Plan: 1)Sigmoid Diverticulitis with Small Pericolonic Abscess- -CT abdomen and pelvis finding noted -EDP discussed with general surgeon  who recommends antibiotics and nonsurgical management at this time WBC 20.7 >>17.9>>16.5 >> 12.9 -IV Zosyn, IV fluids Tolerating liquids better, nausea improved >> advancing diet today -Still complaining of abdominal distention, with improved pain --As needed antiemetics and pain medications -General surgery advised against repeat CT abdomen pelvis with contrast due to patient's age, elevated creatinine..  If patient to deteriorate, urgent CT will be obtained   2) CKD Griffith -3B -Improving - creatinine on admission= 2.34 >>> 1.84 - baseline creatinine in the 1.9 to 2.2 range --Continue to hold Lasix, hold lisinopril--due to concerns about emesis and risk for dehydration/AKI, -Creatinine improving with gentle IV fluids - renally adjust medications, avoid nephrotoxic agents / dehydration  / hypotension   3)Anemia of CKD--Hgb above 9, suspect some component of hemodilution Hgb was close to baseline on admission -No evidence of ongoing bleeding -Remained stable   4)Hypokalemia--suspect due to emesis/GI losses ,  resolved with replacement  5)DM2-continue reduced dose Lantus insulin(12 units nightly) pending better tolerance of oral intake Use Novolog/Humalog Sliding scale insulin with Accu-Cheks/Fingersticks as ordered    6)HTN- -stable BP, hold Lasix, hold lisinopril--due to concerns about emesis and risk for dehydration/AKI, -- continue amlodipine   7)GERD--continue Protonix especially given recurrent emesis   8)BPH---?? LUTS... Difficulty with urination, -Urinary retention worsened, Foley catheter placed on 10/12/2021 C/n  Flomax  -Attempt voiding trial on 10/14/2021, - if patient fails to voiding trial he will need to follow-up with urology as outpatient and keep Foley in for now   9)PI---POA--- pressure injury from right BKA prosthesis -right knee area with abrasion from prosthesis -Wound care consult requested   Disposition/Need for in-Hospital Stay- patient unable to be discharged at this time due to sigmoid of the colitis with abscess requiring IV antibiotics, may need surgical intervention if fails to improve with antibiotics -defer to general surgeon timing of repeat CT abdomen and pelvis)   Status is: Inpatient   Remains inpatient appropriate because:    Dispo: The patient is from: Home              Anticipated d/c is to: Home              Anticipated d/c date is: 1 days -continue evaluating of IV antibiotics, anticipating switching to p.o. in a.m. and discharge home with home health  Barriers: Not Clinically Stable-needing IV antibiotics, awaiting for improved abdominal pain, improved p.o. intake  Code Status : -  Code Status: Full Code   Family Communication:    (patient is alert, awake and coherent)  Discussed with son  DVT Prophylaxis  :   -  SCDs   heparin injection 5,000 Units Start: 10/11/21 1615 SCDs Start: 10/11/21 1521 Place TED hose Start: 10/11/21 1521   Lab Results  Component Value Date   PLT 299 10/14/2021   Inpatient Medications  Scheduled Meds:   amLODipine  5 mg Oral QHS   Chlorhexidine Gluconate Cloth  6 each Topical Daily   Gerhardt's butt cream   Topical BID   heparin  5,000 Units Subcutaneous Q8H   insulin aspart  0-5 Units Subcutaneous QHS   insulin aspart  0-6 Units Subcutaneous TID WC   insulin glargine-yfgn  12 Units Subcutaneous QHS   pantoprazole (PROTONIX) IV  40 mg Intravenous QHS   senna-docusate  2 tablet Oral BID   sodium bicarbonate  650 mg Oral BID   sodium chloride flush  3 mL Intravenous Q12H   tamsulosin  0.4 mg Oral PC supper   Continuous Infusions:  piperacillin-tazobactam (ZOSYN)  IV 3.375 g (10/14/21 1200)   PRN Meds:.acetaminophen **OR** acetaminophen, fentaNYL (SUBLIMAZE) injection, LORazepam, ondansetron **OR** ondansetron (ZOFRAN) IV, oxyCODONE, traZODone   Anti-infectives (From admission, onward)    Start     Dose/Rate Route Frequency Ordered Stop   10/12/21 1400  piperacillin-tazobactam (ZOSYN) IVPB 3.375 g        3.375 g 12.5 mL/hr over 240 Minutes Intravenous Every 8 hours 10/12/21 0930     10/11/21 2000  piperacillin-tazobactam (ZOSYN) IVPB 2.25 g  Status:  Discontinued        2.25 g 100 mL/hr over 30 Minutes Intravenous Every 8 hours 10/11/21 1250 10/12/21 0930   10/11/21 1115  piperacillin-tazobactam (ZOSYN) IVPB 3.375 g        3.375 g 100 mL/hr over 30 Minutes Intravenous  Once 10/11/21 1105 10/11/21 1256         Subjective: Riley Griffith today has no fevers,   No chest pain,    Tolerated liquids, had BM, -Ambulated in the hallways with staff with prosthesis  Objective: Vitals:   10/13/21 0615 10/13/21 1427 10/13/21 2115 10/14/21 0436  BP: 118/60 (!) 133/59 (!) 113/58 123/64  Pulse: 85 87 86 82  Resp: 20 20 (!) 22 19  Temp: 98.1 F (36.7 C) 98.2 F (36.8 C) 98.9 F (37.2 C) (!) 97 F (36.1 C)  TempSrc: Oral Oral Oral   SpO2: 95% 95% 94% 97%  Weight:      Height:        Intake/Output Summary (Last 24 hours) at 10/14/2021 1320 Last data filed at 10/13/2021  2100 Gross per 24 hour  Intake --  Output 650 ml  Net -650 ml   Filed Weights   10/11/21 0929  Weight: 77.1 kg       Physical Exam:   General:  AAO x 3,  cooperative, no distress;   HEENT:  Normocephalic, PERRL, otherwise with in Normal limits   Neuro:  CNII-XII intact. , normal motor and sensation, reflexes intact   Lungs:   Clear to auscultation BL, Respirations unlabored,  No wheezes / crackles  Cardio:    S1/S2, RRR, No murmure, No Rubs or Gallops   Abdomen:  Soft, mild diffuse tenderness, bowel sounds active all four quadrants, no guarding or peritoneal signs.  Muscular  skeletal:  Limited exam -global generalized weaknesses - in bed, able to move all 4 extremities,   2+ pulses,  symmetric, No pitting edema  Skin:  Dry, warm to touch, negative for any Rashes,  Wounds: Please see nursing documentation  Pressure Injury 10/11/21 Coccyx Right;Left;Medial Griffith 1 -  Intact skin with non-blanchable redness of a localized area usually over a bony prominence. Red (Active)  10/11/21 1628  Location: Coccyx  Location Orientation: Right;Left;Medial  Staging: Griffith 1 -  Intact skin with non-blanchable redness of a localized area usually over a bony prominence.  Wound Description (Comments): Red  Present on Admission: Yes  Dressing Type Foam - Lift dressing to assess site every shift;Gauze (Comment);Other (Comment) (Xeroform) 10/13/21 2020     Pressure Injury 10/11/21 Buttocks Left Griffith 2 -  Partial thickness loss of dermis presenting as a shallow open injury with a red, pink wound bed without slough. broken skin (Active)  10/11/21 1639  Location: Buttocks  Location Orientation: Left  Staging: Griffith 2 -  Partial thickness loss of dermis presenting as a shallow open injury with a red, pink wound bed without slough.  Wound Description (Comments): broken skin  Present on Admission: Yes  Dressing Type Foam - Lift dressing to assess site every shift;Gauze (Comment);Other (Comment)  (Xeroform) 10/13/21 2020     Pressure Injury 10/11/21 Knee Anterior;Right;Upper Griffith 2 -  Partial thickness loss of dermis presenting as a shallow open injury with a red, pink wound bed without slough. yellow base (Active)  10/11/21 1640  Location: Knee  Location Orientation: Anterior;Right;Upper  Staging: Griffith 2 -  Partial thickness loss of dermis presenting as a shallow open injury with a red, pink wound bed without slough.  Wound Description (Comments): yellow base  Present on Admission: Yes  Dressing Type Foam - Lift dressing to assess site every shift;Gauze (Comment);Other (Comment) (Xeroform) 10/13/21 2020     Pressure Injury 10/11/21 Thigh Distal;Right;Medial;Mid Griffith 2 -  Partial thickness loss of dermis presenting as a shallow open injury with a red, pink wound bed without slough. red (Active)  10/11/21 1642  Location: Thigh  Location Orientation: Distal;Right;Medial;Mid  Staging: Griffith 2 -  Partial thickness loss of dermis presenting as a shallow open injury with a red, pink wound bed without slough.  Wound Description (Comments): red  Present on Admission:   Dressing Type Other (Comment);Foam - Lift dressing to assess site every shift;Gauze (Comment) (Xeroform) 10/13/21 2020         Data Reviewed: I have personally reviewed following labs and imaging studies  CBC: Recent Labs  Lab 10/11/21 1017 10/12/21 0430 10/13/21 0439 10/14/21 0426  WBC 20.7* 17.9* 16.5* 12.9*  NEUTROABS 18.2*  --   --   --   HGB 10.7* 10.1* 9.6* 9.7*  HCT 32.2* 31.1* 29.8* 30.7*  MCV 94.4 96.0 96.4 97.8  PLT 307 278 280 956   Basic Metabolic Panel: Recent Labs  Lab 10/11/21 1017 10/12/21 0430 10/13/21 0439 10/14/21 0426  NA 137 139 138 139  K 3.4* 3.5 3.6 3.6  CL 103 110 112* 113*  CO2 '25 23 22 '$ 20*  GLUCOSE 175* 113* 136* 105*  BUN 60* 55* 55* 50*  CREATININE 2.34* 1.92* 1.99* 1.84*  CALCIUM 8.8* 8.3* 8.1* 8.0*  PHOS  --   --  3.4  --    GFR: Estimated Creatinine Clearance:  25 mL/min (A) (by C-G formula based on SCr of 1.84 mg/dL (H)). Liver Function Tests: Recent Labs  Lab 10/11/21 1017 10/13/21 0439 10/14/21 0426  AST 26  --  20  ALT 31  --  24  ALKPHOS 89  --  75  BILITOT 0.7  --  0.3  PROT 5.9*  --  4.7*  ALBUMIN 2.9* 2.1* 2.0*    Radiology Studies: No results found.  Scheduled Meds:  amLODipine  5 mg Oral QHS   Chlorhexidine Gluconate Cloth  6 each Topical Daily   Gerhardt's butt cream   Topical BID   heparin  5,000 Units Subcutaneous Q8H   insulin aspart  0-5 Units Subcutaneous QHS   insulin aspart  0-6 Units Subcutaneous TID WC   insulin glargine-yfgn  12 Units Subcutaneous QHS   pantoprazole (PROTONIX) IV  40 mg Intravenous QHS   senna-docusate  2 tablet Oral BID   sodium bicarbonate  650 mg Oral BID   sodium chloride flush  3 mL Intravenous Q12H   tamsulosin  0.4 mg Oral PC supper   Continuous Infusions:  piperacillin-tazobactam (ZOSYN)  IV 3.375 g (10/14/21 1200)    LOS: 3 days   Deatra Woodroe M.D on 10/14/2021 at 1:20 PM  Go to [email or web address] - for contact info  [facility] - Office  [phone]  If 7PM-7AM, please contact night-coverage [email or web address] 10/14/2021, 1:20 PM

## 2021-10-14 NOTE — Progress Notes (Signed)
[facility] Progress Note     Subjective: Patient seen and examined.  He is resting comfortably in bed.  He was eating his breakfast not too long ago, without nausea and vomiting.  He denies abdominal pain at this time.  He last had a bowel movement yesterday evening.  Objective: Vital signs in last 24 hours: Temp:  [97 F (36.1 C)-98.9 F (37.2 C)] 97 F (36.1 C) (06/21 0436) Pulse Rate:  [82-87] 82 (06/21 0436) Resp:  [19-22] 19 (06/21 0436) BP: (113-133)/(58-64) 123/64 (06/21 0436) SpO2:  [94 %-97 %] 97 % (06/21 0436) Last BM Date : 10/13/21  Intake/Output from previous day: 06/20 0701 - 06/21 0700 In: -  Out: 650 [Urine:650] Intake/Output this shift: No intake/output data recorded.  General appearance: alert, cooperative, and no distress GI: Abdomen soft, nondistended, no percussion tenderness, minimal tenderness to palpation in the left lower quadrant; no rigidity, guarding, rebound tenderness  Lab Results:  Recent Labs    10/13/21 0439 10/14/21 0426  WBC 16.5* 12.9*  HGB 9.6* 9.7*  HCT 29.8* 30.7*  PLT 280 299   BMET Recent Labs    10/13/21 0439 10/14/21 0426  NA 138 139  K 3.6 3.6  CL 112* 113*  CO2 22 20*  GLUCOSE 136* 105*  BUN 55* 50*  CREATININE 1.99* 1.84*  CALCIUM 8.1* 8.0*   PT/INR No results for input(s): "LABPROT", "INR" in the last 72 hours.  Studies/Results: No results found.  Anti-infectives: Anti-infectives (From admission, onward)    Start     Dose/Rate Route Frequency Ordered Stop   10/12/21 1400  piperacillin-tazobactam (ZOSYN) IVPB 3.375 g        3.375 g 12.5 mL/hr over 240 Minutes Intravenous Every 8 hours 10/12/21 0930     10/11/21 2000  piperacillin-tazobactam (ZOSYN) IVPB 2.25 g  Status:  Discontinued        2.25 g 100 mL/hr over 30 Minutes Intravenous Every 8 hours 10/11/21 1250 10/12/21 0930   10/11/21 1115  piperacillin-tazobactam (ZOSYN) IVPB 3.375 g        3.375 g 100 mL/hr over 30 Minutes  Intravenous  Once 10/11/21 1105 10/11/21 1256       Assessment/Plan:  Patient is a 88 year old male who was admitted with sigmoid diverticulitis with a 3.1 x 2.8 cm pericolonic abscess.  -Leukocytosis continues to improve, 12.9 today from 16.5 -Continue IV Zosyn -Continue GI soft diet -Senokot-S ordered -Given that the patient is continuing to clinically improve with decreasing leukocytosis and decreasing pain, will hold off on CT abdomen and pelvis with IV contrast at this time.  Contrast would put him at increased risk for AKI, and the patient's Cr still remains elevated at 1.84. Would consider rescanning the patient if he clinically worsens. -No acute surgical intervention at this time -Patient stable for discharge from general surgery standpoint, as leukocytosis continues to improve and patient tolerating a GI soft diet and moving his bowels.  Would recommend the patient be discharged with Augmentin for total of 10 days of treatment -Appreciate hospitalist recommendations   LOS: 3 days    [place] 10/14/2021

## 2021-10-15 DIAGNOSIS — K5792 Diverticulitis of intestine, part unspecified, without perforation or abscess without bleeding: Secondary | ICD-10-CM | POA: Diagnosis not present

## 2021-10-15 LAB — CBC
HCT: 32.2 % — ABNORMAL LOW (ref 39.0–52.0)
Hemoglobin: 10.5 g/dL — ABNORMAL LOW (ref 13.0–17.0)
MCH: 31.3 pg (ref 26.0–34.0)
MCHC: 32.6 g/dL (ref 30.0–36.0)
MCV: 95.8 fL (ref 80.0–100.0)
Platelets: 310 10*3/uL (ref 150–400)
RBC: 3.36 MIL/uL — ABNORMAL LOW (ref 4.22–5.81)
RDW: 14 % (ref 11.5–15.5)
WBC: 13.2 10*3/uL — ABNORMAL HIGH (ref 4.0–10.5)
nRBC: 0 % (ref 0.0–0.2)

## 2021-10-15 LAB — BASIC METABOLIC PANEL
Anion gap: 7 (ref 5–15)
BUN: 46 mg/dL — ABNORMAL HIGH (ref 8–23)
CO2: 20 mmol/L — ABNORMAL LOW (ref 22–32)
Calcium: 8 mg/dL — ABNORMAL LOW (ref 8.9–10.3)
Chloride: 111 mmol/L (ref 98–111)
Creatinine, Ser: 1.88 mg/dL — ABNORMAL HIGH (ref 0.61–1.24)
GFR, Estimated: 32 mL/min — ABNORMAL LOW (ref >60)
Glucose, Bld: 85 mg/dL (ref 70–99)
Potassium: 3.3 mmol/L — ABNORMAL LOW (ref 3.5–5.1)
Sodium: 138 mmol/L (ref 135–145)

## 2021-10-15 LAB — GLUCOSE, CAPILLARY
Glucose-Capillary: 61 mg/dL — ABNORMAL LOW (ref 70–99)
Glucose-Capillary: 68 mg/dL — ABNORMAL LOW (ref 70–99)
Glucose-Capillary: 89 mg/dL (ref 70–99)
Glucose-Capillary: 86 mg/dL (ref 70–99)
Glucose-Capillary: 89 mg/dL (ref 70–99)
Glucose-Capillary: 70 mg/dL (ref 70–99)

## 2021-10-15 MED ORDER — CIPROFLOXACIN HCL 250 MG PO TABS
250.0000 mg | ORAL_TABLET | Freq: Two times a day (BID) | ORAL | Status: DC
  Administered 2021-10-15 (×2): 250 mg via ORAL
  Filled 2021-10-15 (×3): qty 1

## 2021-10-15 MED ORDER — INSULIN GLARGINE-YFGN 100 UNIT/ML ~~LOC~~ SOLN
9.0000 [IU] | Freq: Every day | SUBCUTANEOUS | Status: DC
  Filled 2021-10-15 (×2): qty 0.09

## 2021-10-15 MED ORDER — RISAQUAD PO CAPS
1.0000 | ORAL_CAPSULE | Freq: Three times a day (TID) | ORAL | Status: DC
  Administered 2021-10-15 – 2021-10-17 (×6): 1 via ORAL
  Filled 2021-10-15 (×6): qty 1

## 2021-10-15 MED ORDER — POTASSIUM CHLORIDE CRYS ER 20 MEQ PO TBCR
40.0000 meq | EXTENDED_RELEASE_TABLET | Freq: Once | ORAL | Status: AC
  Administered 2021-10-15: 40 meq via ORAL
  Filled 2021-10-15: qty 2

## 2021-10-15 MED ORDER — SODIUM CHLORIDE 0.9 % IV SOLN
INTRAVENOUS | Status: AC
Start: 2021-10-15 — End: 2021-10-15

## 2021-10-15 MED ORDER — METRONIDAZOLE 500 MG PO TABS
500.0000 mg | ORAL_TABLET | Freq: Three times a day (TID) | ORAL | Status: DC
  Administered 2021-10-15 – 2021-10-17 (×7): 500 mg via ORAL
  Filled 2021-10-15 (×7): qty 1

## 2021-10-15 NOTE — Progress Notes (Signed)
OT Cancellation Note  Patient Details Name: Riley Griffith MRN: [id number] DOB: Sep 01, 1923   Cancelled Treatment:    Reason Eval/Treat Not Completed: Fatigue/lethargy limiting ability to participate. Attempted to wake patient this AM but with minimal success. Pt very lethargic with nursing reporting pt recently received Ativan. Will attempt to evaluate pt later as time permits.   Katty Fretwell OT, MOT   Larey Seat 10/15/2021, 12:48 PM

## 2021-10-15 NOTE — Plan of Care (Signed)
  Problem: Acute Rehab PT Goals(only PT should resolve) Goal: Pt Will Go Supine/Side To Sit Outcome: Progressing Flowsheets (Taken 10/15/2021 1405) Pt will go Supine/Side to Sit:  with moderate assist  with minimal assist Goal: Patient Will Perform Sitting Balance Outcome: Progressing Flowsheets (Taken 10/15/2021 1405) Patient will perform sitting balance:  3- 5 min  with bilateral UE support  with moderate assist  with minimal assist Goal: Patient Will Transfer Sit To/From Stand Outcome: Progressing Flowsheets (Taken 10/15/2021 1405) Patient will transfer sit to/from stand:  with maximum assist  with moderate assist Goal: Pt Will Transfer Bed To Chair/Chair To Bed Outcome: Progressing Flowsheets (Taken 10/15/2021 1405) Pt will Transfer Bed to Chair/Chair to Bed:  with mod assist  with max assist Goal: Pt Will Perform Standing Balance Or Pre-Gait Outcome: Progressing Flowsheets (Taken 10/15/2021 1405) Pt will perform standing balance or pre-gait:  1-2 min  with bilateral UE support  with moderate assist  with maximum assist Goal: Pt Will Ambulate Outcome: Progressing Flowsheets (Taken 10/15/2021 1405) Pt will Ambulate:  25 feet  with maximum assist  with moderate assist  with rolling walker   2:06 PM, 10/15/21 Lestine Box, S/PT

## 2021-10-15 NOTE — Progress Notes (Signed)
[facility] Progress Note     Subjective: Patient seen and examined.  He is resting comfortably in bed with no complaints.  He is a little bit more tired this morning.  He denies nausea, vomiting, and abdominal pain.  He last had a bowel movement yesterday.  Objective: Vital signs in last 24 hours: Temp:  [97.6 F (36.4 C)-98.1 F (36.7 C)] 98.1 F (36.7 C) (06/22 0426) Pulse Rate:  [91-97] 91 (06/22 0426) Resp:  [20] 20 (06/22 0426) BP: (122-151)/(63-70) 122/63 (06/22 0426) SpO2:  [93 %-97 %] 93 % (06/22 0426) Last BM Date : 10/15/21  Intake/Output from previous day: 06/21 0701 - 06/22 0700 In: -  Out: 900 [Urine:900] Intake/Output this shift: Total I/O In: -  Out: 400 [Urine:400]  General appearance: cooperative, no distress, and confused GI: Abdomen soft, nondistended, no percussion tenderness, minimal tenderness to palpation in left lower quadrant; no rigidity, guarding, or rebound tenderness  Lab Results:  Recent Labs    10/14/21 0426 10/15/21 0455  WBC 12.9* 13.2*  HGB 9.7* 10.5*  HCT 30.7* 32.2*  PLT 299 310   BMET Recent Labs    10/14/21 0426 10/15/21 0455  NA 139 138  K 3.6 3.3*  CL 113* 111  CO2 20* 20*  GLUCOSE 105* 85  BUN 50* 46*  CREATININE 1.84* 1.88*  CALCIUM 8.0* 8.0*   PT/INR No results for input(s): "LABPROT", "INR" in the last 72 hours.  Studies/Results: No results found.  Anti-infectives: Anti-infectives (From admission, onward)    Start     Dose/Rate Route Frequency Ordered Stop   10/15/21 0915  ciprofloxacin (CIPRO) tablet 250 mg        250 mg Oral 2 times daily 10/15/21 0827     10/15/21 0915  metroNIDAZOLE (FLAGYL) tablet 500 mg        500 mg Oral Every 8 hours 10/15/21 0827     10/12/21 1400  piperacillin-tazobactam (ZOSYN) IVPB 3.375 g  Status:  Discontinued        3.375 g 12.5 mL/hr over 240 Minutes Intravenous Every 8 hours 10/12/21 0930 10/15/21 0827   10/11/21 2000  piperacillin-tazobactam (ZOSYN)  IVPB 2.25 g  Status:  Discontinued        2.25 g 100 mL/hr over 30 Minutes Intravenous Every 8 hours 10/11/21 1250 10/12/21 0930   10/11/21 1115  piperacillin-tazobactam (ZOSYN) IVPB 3.375 g        3.375 g 100 mL/hr over 30 Minutes Intravenous  Once 10/11/21 1105 10/11/21 1256       Assessment/Plan:  Patient is a 88 year old male who was admitted with sigmoid diverticulitis with a 3.1 x 2.8 cm pericolonic abscess.  -Leukocytosis stable, 13.2 from 12.9 yesterday -Patient transitioned to PO antibiotics this morning -Continue with soft diet -We will continue to hold off on CT abdomen and pelvis with IV contrast given clinical improvement, decreased abdominal pain, and patient's age, especially given that creatinine remains elevated -Consider rescanning if patient clinically worsens -No acute surgical intervention at this time -Stable for discharge from general surgery standpoint -Would need to be discharged on oral antibiotics, transitioned to Cipro and Flagyl today -Would recommend that the patient follow-up with his primary care doctor for repeat blood work in a week to verify that leukocytosis resolved -Appreciate hospitalist recommendations   LOS: 4 days    Averil Digman A Koby Pickup 10/15/2021

## 2021-10-15 NOTE — Progress Notes (Signed)
[place], [name] [phone]  [phone]  [facility] care giver was there and wife passed phone to her to help. Had son call me back. Stated that he wanted nursing staff and doctors  to call him  stated that his mother is unable to make decisions due to he dementia  [phone] ( Riley Griffith)

## 2021-10-15 NOTE — Progress Notes (Signed)
PROGRESS NOTE   Riley Griffith, is a 87 y.o. male, DOB - 18-Oct-1923, ZOX:[id number]  Admit date - 10/11/2021   Admitting Physician Courage Denton Brick, MD  Outpatient Primary MD for the patient is [name], MD  LOS - 4  Chief Complaint  Patient presents with   Emesis      Brief Narrative:  Riley Griffith  is a 88 y.o. male   with a pmhx significant for DM, HTN, CKD 3B, GERD and osteomyelitis of right foot (s/p BKA), as well as hearing loss and chronic anemia of CKD admitted on 10/11/2021 with sigmoid diverticulitis with pericolonic abscess  Subjective: Patient was seen and examined this morning, pleasantly confused  More confused than yesterday, vitals stable, mildly hypoglycemic (CBG 61, 68, 89 this morning)   -Assessment and Plan: 1)Sigmoid Diverticulitis with Small Pericolonic Abscess- -Stable, afebrile, normotensive  -CT abdomen and pelvis finding noted -EDP discussed with general surgeon  who recommends antibiotics and nonsurgical management at this time WBC 20.7 >>17.9>>16.5 >> 12.9>> 13.2 -IV Zosyn, discontinuing today 6/22 >>> initiating p.o. Cipro Flagyl  Tolerating liquids better, nausea improved >> advancing diet today  --As needed antiemetics and pain medications -General surgery advised against repeat CT abdomen pelvis with contrast due to patient's age, elevated creatinine..  If patient to deteriorate, urgent CT will be obtained   2) CKD stage -3B -Improving - creatinine on admission= 2.34 >>> 1.84 >> 1.88 - baseline creatinine in the 1.9 to 2.2 range --Continue to hold Lasix, hold lisinopril--due to concerns about emesis and risk for dehydration/AKI, -Creatinine improving with gentle IV fluids - renally adjust medications, avoid nephrotoxic agents / dehydration  / hypotension   3)Anemia of CKD--Hgb above 9, suspect some component of hemodilution Hgb was close to baseline on admission -No evidence of ongoing bleeding -Remained stable    4)Hypokalemia--suspect due to emesis/GI losses , monitoring and repleting  5)DM2 -Mildly hypoglycemic this morning, CBG 61, 60, 89 -Reducing long-acting insulin from 12 units to 9 units Use Novolog/Humalog Sliding scale insulin with Accu-Cheks/Fingersticks as ordered    6)HTN- -stable BP, hold Lasix, hold lisinopril--due to concerns about emesis and risk for dehydration/AKI, -- continue amlodipine   7)GERD--continue Protonix especially given recurrent emesis   8)BPH---?? LUTS... Difficulty with urination, -Urinary retention worsened, Foley catheter placed on 10/12/2021 C/n  Flomax  -Attempt voiding trial on 10/14/2021, - if patient fails to voiding trial he will need to follow-up with urology as outpatient and keep Foley in for now   9)PI---POA--- pressure injury from right BKA prosthesis -right knee area with abrasion from prosthesis -Wound care consult requested   10) severe debility  -PT OT consulted for evaluation recommendation -Patient's son is open to SNF placement   disposition/Need for in-Hospital Stay- patient unable to be discharged at this time due to sigmoid of the colitis with abscess requiring IV antibiotics, may need surgical intervention if fails to improve with antibiotics -defer to general surgeon timing of repeat CT abdomen and pelvis)        Status is: Inpatient   Remains inpatient appropriate because:    Dispo: The patient is from: Home              Anticipated d/c is to: Home -pending PT OT evaluation              Anticipated d/c date is: 1 days -continue evaluating of IV antibiotics, anticipating switching to p.o. in a.m. and discharge home with home health  Barriers: Not Clinically Stable-needing  IV antibiotics, awaiting for improved abdominal pain, improved p.o. intake  Code Status : -  Code Status: Full Code   Family Communication:    (patient is alert, awake and coherent)  Discussed with son  DVT Prophylaxis  :   - SCDs   heparin  injection 5,000 Units Start: 10/11/21 1615 SCDs Start: 10/11/21 1521 Place TED hose Start: 10/11/21 1521   Lab Results  Component Value Date   PLT 310 10/15/2021   Inpatient Medications  Scheduled Meds:  acidophilus  1 capsule Oral TID WC   amLODipine  5 mg Oral QHS   Chlorhexidine Gluconate Cloth  6 each Topical Daily   ciprofloxacin  250 mg Oral BID   Gerhardt's butt cream   Topical BID   heparin  5,000 Units Subcutaneous Q8H   insulin aspart  0-5 Units Subcutaneous QHS   insulin aspart  0-6 Units Subcutaneous TID WC   insulin glargine-yfgn  9 Units Subcutaneous QHS   metroNIDAZOLE  500 mg Oral Q8H   senna-docusate  2 tablet Oral BID   sodium bicarbonate  650 mg Oral BID   sodium chloride flush  3 mL Intravenous Q12H   tamsulosin  0.4 mg Oral PC supper   Continuous Infusions:  sodium chloride 75 mL/hr at 10/15/21 1019   PRN Meds:.acetaminophen **OR** acetaminophen, ondansetron **OR** ondansetron (ZOFRAN) IV, oxyCODONE, traZODone   Anti-infectives (From admission, onward)    Start     Dose/Rate Route Frequency Ordered Stop   10/15/21 0915  ciprofloxacin (CIPRO) tablet 250 mg        250 mg Oral 2 times daily 10/15/21 0827     10/15/21 0915  metroNIDAZOLE (FLAGYL) tablet 500 mg        500 mg Oral Every 8 hours 10/15/21 0827     10/12/21 1400  piperacillin-tazobactam (ZOSYN) IVPB 3.375 g  Status:  Discontinued        3.375 g 12.5 mL/hr over 240 Minutes Intravenous Every 8 hours 10/12/21 0930 10/15/21 0827   10/11/21 2000  piperacillin-tazobactam (ZOSYN) IVPB 2.25 g  Status:  Discontinued        2.25 g 100 mL/hr over 30 Minutes Intravenous Every 8 hours 10/11/21 1250 10/12/21 0930   10/11/21 1115  piperacillin-tazobactam (ZOSYN) IVPB 3.375 g        3.375 g 100 mL/hr over 30 Minutes Intravenous  Once 10/11/21 1105 10/11/21 1256         Subjective: Riley Griffith today has no fevers,   No chest pain,    Tolerated liquids, had BM, -Ambulated in the hallways with  staff with prosthesis  Objective: Vitals:   10/14/21 0436 10/14/21 1401 10/14/21 2113 10/15/21 0426  BP: 123/64 127/69 (!) 151/70 122/63  Pulse: 82 96 97 91  Resp: '19 20 20 20  '$ Temp: (!) 97 F (36.1 C) 97.6 F (36.4 C) 97.8 F (36.6 C) 98.1 F (36.7 C)  TempSrc:  Oral Oral   SpO2: 97% 97% 97% 93%  Weight:      Height:        Intake/Output Summary (Last 24 hours) at 10/15/2021 1123 Last data filed at 10/15/2021 0730 Gross per 24 hour  Intake --  Output 1300 ml  Net -1300 ml   Filed Weights   10/11/21 0929  Weight: 77.1 kg      Physical Exam:   General:  Pleasantly confused this morning cooperative, no distress;   HEENT:  Normocephalic, PERRL, otherwise with in Normal limits   Neuro:  CNII-XII intact. ,  normal motor and sensation, reflexes intact   Lungs:   Clear to auscultation BL, Respirations unlabored,  No wheezes / crackles  Cardio:    S1/S2, RRR, No murmure, No Rubs or Gallops   Abdomen:  Soft, non-tender, bowel sounds active all four quadrants, no guarding or peritoneal signs.  Muscular  skeletal:  Right BKA  Limited exam -global generalized weaknesses - in bed, able to move all 4 extremities,   2+ pulses,  symmetric, No pitting edema  Skin:  Dry, warm to touch, negative for any Rashes,  Wounds: Please see nursing documentation  Pressure Injury 10/11/21 Coccyx Right;Left;Medial Stage 1 -  Intact skin with non-blanchable redness of a localized area usually over a bony prominence. Red (Active)  10/11/21 1628  Location: Coccyx  Location Orientation: Right;Left;Medial  Staging: Stage 1 -  Intact skin with non-blanchable redness of a localized area usually over a bony prominence.  Wound Description (Comments): Red  Present on Admission: Yes  Dressing Type Foam - Lift dressing to assess site every shift 10/14/21 2128     Pressure Injury 10/11/21 Buttocks Left Stage 2 -  Partial thickness loss of dermis presenting as a shallow open injury with a red, pink wound  bed without slough. broken skin (Active)  10/11/21 1639  Location: Buttocks  Location Orientation: Left  Staging: Stage 2 -  Partial thickness loss of dermis presenting as a shallow open injury with a red, pink wound bed without slough.  Wound Description (Comments): broken skin  Present on Admission: Yes  Dressing Type Foam - Lift dressing to assess site every shift 10/14/21 2128     Pressure Injury 10/11/21 Knee Anterior;Right;Upper Stage 2 -  Partial thickness loss of dermis presenting as a shallow open injury with a red, pink wound bed without slough. yellow base (Active)  10/11/21 1640  Location: Knee  Location Orientation: Anterior;Right;Upper  Staging: Stage 2 -  Partial thickness loss of dermis presenting as a shallow open injury with a red, pink wound bed without slough.  Wound Description (Comments): yellow base  Present on Admission: Yes  Dressing Type Foam - Lift dressing to assess site every shift 10/14/21 2128     Pressure Injury 10/11/21 Thigh Distal;Right;Medial;Mid Stage 2 -  Partial thickness loss of dermis presenting as a shallow open injury with a red, pink wound bed without slough. red (Active)  10/11/21 1642  Location: Thigh  Location Orientation: Distal;Right;Medial;Mid  Staging: Stage 2 -  Partial thickness loss of dermis presenting as a shallow open injury with a red, pink wound bed without slough.  Wound Description (Comments): red  Present on Admission:   Dressing Type Other (Comment) (gauze & xeroform) 10/14/21 2128           Data Reviewed: I have personally reviewed following labs and imaging studies  CBC: Recent Labs  Lab 10/11/21 1017 10/12/21 0430 10/13/21 0439 10/14/21 0426 10/15/21 0455  WBC 20.7* 17.9* 16.5* 12.9* 13.2*  NEUTROABS 18.2*  --   --   --   --   HGB 10.7* 10.1* 9.6* 9.7* 10.5*  HCT 32.2* 31.1* 29.8* 30.7* 32.2*  MCV 94.4 96.0 96.4 97.8 95.8  PLT 307 278 280 299 621   Basic Metabolic Panel: Recent Labs  Lab  10/11/21 1017 10/12/21 0430 10/13/21 0439 10/14/21 0426 10/15/21 0455  NA 137 139 138 139 138  K 3.4* 3.5 3.6 3.6 3.3*  CL 103 110 112* 113* 111  CO2 '25 23 22 '$ 20* 20*  GLUCOSE 175* 113* 136*  105* 85  BUN 60* 55* 55* 50* 46*  CREATININE 2.34* 1.92* 1.99* 1.84* 1.88*  CALCIUM 8.8* 8.3* 8.1* 8.0* 8.0*  PHOS  --   --  3.4  --   --    GFR: Estimated Creatinine Clearance: 24.5 mL/min (A) (by C-G formula based on SCr of 1.88 mg/dL (H)). Liver Function Tests: Recent Labs  Lab 10/11/21 1017 10/13/21 0439 10/14/21 0426  AST 26  --  20  ALT 31  --  24  ALKPHOS 89  --  75  BILITOT 0.7  --  0.3  PROT 5.9*  --  4.7*  ALBUMIN 2.9* 2.1* 2.0*    Radiology Studies: No results found.   Scheduled Meds:  acidophilus  1 capsule Oral TID WC   amLODipine  5 mg Oral QHS   Chlorhexidine Gluconate Cloth  6 each Topical Daily   ciprofloxacin  250 mg Oral BID   Gerhardt's butt cream   Topical BID   heparin  5,000 Units Subcutaneous Q8H   insulin aspart  0-5 Units Subcutaneous QHS   insulin aspart  0-6 Units Subcutaneous TID WC   insulin glargine-yfgn  9 Units Subcutaneous QHS   metroNIDAZOLE  500 mg Oral Q8H   senna-docusate  2 tablet Oral BID   sodium bicarbonate  650 mg Oral BID   sodium chloride flush  3 mL Intravenous Q12H   tamsulosin  0.4 mg Oral PC supper   Continuous Infusions:  sodium chloride 75 mL/hr at 10/15/21 1019    LOS: 4 days   Deatra Christifer M.D on 10/15/2021 at 11:23 AM  Go to [email or web address] - for contact info  [facility] - Office  [phone]  If 7PM-7AM, please contact night-coverage [email or web address] 10/15/2021, 11:23 AM

## 2021-10-15 NOTE — Progress Notes (Signed)
Pharmacy Antibiotic Note  Riley Griffith is a 88 y.o. male admitted on 10/11/2021 with  intra-abdominal infxn .  Pharmacy has been consulted for Zosyn dosing. Scr improved  Plan:  Zosyn 3.375gm IV q8hrs EID over 4 hours F/U transition to augmentin  Monitor V/S, labs , cxs and clinical progress  Height: '5\' 11"'$  (180.3 cm) Weight: 77.1 kg (169 lb 15.6 oz) IBW/kg (Calculated) : 75.3  Temp (24hrs), Avg:97.8 F (36.6 C), Min:97.6 F (36.4 C), Max:98.1 F (36.7 C)  Recent Labs  Lab 10/11/21 1017 10/12/21 0430 10/13/21 0439 10/14/21 0426 10/15/21 0455  WBC 20.7* 17.9* 16.5* 12.9* 13.2*  CREATININE 2.34* 1.92* 1.99* 1.84* 1.88*     Estimated Creatinine Clearance: 24.5 mL/min (A) (by C-G formula based on SCr of 1.88 mg/dL (H)).    No Known Allergies  Antimicrobials this admission: Zosyn 6/18 >>   Thank you for allowing pharmacy to be a part of this patient's care.  Margot Ables, PharmD Clinical Pharmacist 10/15/2021 9:44 AM

## 2021-10-16 DIAGNOSIS — R1310 Dysphagia, unspecified: Secondary | ICD-10-CM | POA: Diagnosis present

## 2021-10-16 DIAGNOSIS — Z89511 Acquired absence of right leg below knee: Secondary | ICD-10-CM | POA: Diagnosis not present

## 2021-10-16 DIAGNOSIS — K219 Gastro-esophageal reflux disease without esophagitis: Secondary | ICD-10-CM | POA: Diagnosis present

## 2021-10-16 DIAGNOSIS — M6259 Muscle wasting and atrophy, not elsewhere classified, multiple sites: Secondary | ICD-10-CM | POA: Diagnosis not present

## 2021-10-16 DIAGNOSIS — G9340 Encephalopathy, unspecified: Secondary | ICD-10-CM | POA: Diagnosis present

## 2021-10-16 DIAGNOSIS — R54 Age-related physical debility: Secondary | ICD-10-CM | POA: Diagnosis present

## 2021-10-16 DIAGNOSIS — Z794 Long term (current) use of insulin: Secondary | ICD-10-CM | POA: Diagnosis not present

## 2021-10-16 DIAGNOSIS — R1312 Dysphagia, oropharyngeal phase: Secondary | ICD-10-CM | POA: Diagnosis not present

## 2021-10-16 DIAGNOSIS — E861 Hypovolemia: Secondary | ICD-10-CM | POA: Diagnosis present

## 2021-10-16 DIAGNOSIS — J9601 Acute respiratory failure with hypoxia: Secondary | ICD-10-CM | POA: Diagnosis present

## 2021-10-16 DIAGNOSIS — J690 Pneumonitis due to inhalation of food and vomit: Secondary | ICD-10-CM | POA: Diagnosis present

## 2021-10-16 DIAGNOSIS — A419 Sepsis, unspecified organism: Secondary | ICD-10-CM | POA: Diagnosis present

## 2021-10-16 DIAGNOSIS — Z515 Encounter for palliative care: Secondary | ICD-10-CM | POA: Diagnosis not present

## 2021-10-16 DIAGNOSIS — R109 Unspecified abdominal pain: Secondary | ICD-10-CM | POA: Diagnosis not present

## 2021-10-16 DIAGNOSIS — I129 Hypertensive chronic kidney disease with stage 1 through stage 4 chronic kidney disease, or unspecified chronic kidney disease: Secondary | ICD-10-CM | POA: Diagnosis present

## 2021-10-16 DIAGNOSIS — N179 Acute kidney failure, unspecified: Secondary | ICD-10-CM | POA: Diagnosis present

## 2021-10-16 DIAGNOSIS — K5792 Diverticulitis of intestine, part unspecified, without perforation or abscess without bleeding: Secondary | ICD-10-CM | POA: Diagnosis not present

## 2021-10-16 DIAGNOSIS — N1832 Chronic kidney disease, stage 3b: Secondary | ICD-10-CM | POA: Diagnosis present

## 2021-10-16 DIAGNOSIS — H353131 Nonexudative age-related macular degeneration, bilateral, early dry stage: Secondary | ICD-10-CM | POA: Diagnosis not present

## 2021-10-16 DIAGNOSIS — K5700 Diverticulitis of small intestine with perforation and abscess without bleeding: Secondary | ICD-10-CM | POA: Diagnosis not present

## 2021-10-16 DIAGNOSIS — I700 Atherosclerosis of aorta: Secondary | ICD-10-CM | POA: Diagnosis not present

## 2021-10-16 DIAGNOSIS — R531 Weakness: Secondary | ICD-10-CM | POA: Diagnosis not present

## 2021-10-16 DIAGNOSIS — Z79899 Other long term (current) drug therapy: Secondary | ICD-10-CM | POA: Diagnosis not present

## 2021-10-16 DIAGNOSIS — D75839 Thrombocytosis, unspecified: Secondary | ICD-10-CM | POA: Diagnosis present

## 2021-10-16 DIAGNOSIS — I10 Essential (primary) hypertension: Secondary | ICD-10-CM | POA: Diagnosis not present

## 2021-10-16 DIAGNOSIS — E872 Acidosis, unspecified: Secondary | ICD-10-CM | POA: Diagnosis present

## 2021-10-16 DIAGNOSIS — Z66 Do not resuscitate: Secondary | ICD-10-CM | POA: Diagnosis not present

## 2021-10-16 DIAGNOSIS — R627 Adult failure to thrive: Secondary | ICD-10-CM | POA: Diagnosis present

## 2021-10-16 DIAGNOSIS — K5720 Diverticulitis of large intestine with perforation and abscess without bleeding: Secondary | ICD-10-CM | POA: Diagnosis present

## 2021-10-16 DIAGNOSIS — D75838 Other thrombocytosis: Secondary | ICD-10-CM | POA: Diagnosis present

## 2021-10-16 DIAGNOSIS — J90 Pleural effusion, not elsewhere classified: Secondary | ICD-10-CM | POA: Diagnosis present

## 2021-10-16 DIAGNOSIS — R41841 Cognitive communication deficit: Secondary | ICD-10-CM | POA: Diagnosis not present

## 2021-10-16 DIAGNOSIS — Z20822 Contact with and (suspected) exposure to covid-19: Secondary | ICD-10-CM | POA: Diagnosis present

## 2021-10-16 DIAGNOSIS — Z741 Need for assistance with personal care: Secondary | ICD-10-CM | POA: Diagnosis not present

## 2021-10-16 DIAGNOSIS — M6281 Muscle weakness (generalized): Secondary | ICD-10-CM | POA: Diagnosis not present

## 2021-10-16 DIAGNOSIS — E119 Type 2 diabetes mellitus without complications: Secondary | ICD-10-CM | POA: Diagnosis not present

## 2021-10-16 DIAGNOSIS — R6520 Severe sepsis without septic shock: Secondary | ICD-10-CM | POA: Diagnosis present

## 2021-10-16 DIAGNOSIS — E1122 Type 2 diabetes mellitus with diabetic chronic kidney disease: Secondary | ICD-10-CM | POA: Diagnosis present

## 2021-10-16 DIAGNOSIS — R000 Tachycardia, unspecified: Secondary | ICD-10-CM | POA: Diagnosis not present

## 2021-10-16 LAB — GLUCOSE, CAPILLARY
Glucose-Capillary: 74 mg/dL (ref 70–99)
Glucose-Capillary: 114 mg/dL — ABNORMAL HIGH (ref 70–99)
Glucose-Capillary: 176 mg/dL — ABNORMAL HIGH (ref 70–99)
Glucose-Capillary: 197 mg/dL — ABNORMAL HIGH (ref 70–99)

## 2021-10-16 LAB — BASIC METABOLIC PANEL
Anion gap: 5 (ref 5–15)
BUN: 38 mg/dL — ABNORMAL HIGH (ref 8–23)
CO2: 21 mmol/L — ABNORMAL LOW (ref 22–32)
Calcium: 7.8 mg/dL — ABNORMAL LOW (ref 8.9–10.3)
Chloride: 115 mmol/L — ABNORMAL HIGH (ref 98–111)
Creatinine, Ser: 1.71 mg/dL — ABNORMAL HIGH (ref 0.61–1.24)
GFR, Estimated: 36 mL/min — ABNORMAL LOW (ref >60)
Glucose, Bld: 69 mg/dL — ABNORMAL LOW (ref 70–99)
Potassium: 3.4 mmol/L — ABNORMAL LOW (ref 3.5–5.1)
Sodium: 141 mmol/L (ref 135–145)

## 2021-10-16 LAB — CBC
HCT: 32.5 % — ABNORMAL LOW (ref 39.0–52.0)
Hemoglobin: 10.4 g/dL — ABNORMAL LOW (ref 13.0–17.0)
MCH: 30.8 pg (ref 26.0–34.0)
MCHC: 32 g/dL (ref 30.0–36.0)
MCV: 96.2 fL (ref 80.0–100.0)
Platelets: 321 10*3/uL (ref 150–400)
RBC: 3.38 MIL/uL — ABNORMAL LOW (ref 4.22–5.81)
RDW: 13.9 % (ref 11.5–15.5)
WBC: 13 10*3/uL — ABNORMAL HIGH (ref 4.0–10.5)
nRBC: 0 % (ref 0.0–0.2)

## 2021-10-16 MED ORDER — INSULIN GLARGINE-YFGN 100 UNIT/ML ~~LOC~~ SOLN
5.0000 [IU] | Freq: Every day | SUBCUTANEOUS | Status: DC
  Administered 2021-10-16: 5 [IU] via SUBCUTANEOUS
  Filled 2021-10-16 (×2): qty 0.05

## 2021-10-16 MED ORDER — INSULIN GLARGINE 100 UNIT/ML ~~LOC~~ SOLN: 5.0000 [IU] | Freq: Every day | SUBCUTANEOUS | 11 refills | Status: DC

## 2021-10-16 MED ORDER — CIPROFLOXACIN HCL 500 MG PO TABS: 500.0000 mg | ORAL_TABLET | Freq: Every day | ORAL | 0 refills | Status: DC

## 2021-10-16 MED ORDER — SODIUM CHLORIDE 0.9 % IV SOLN: INTRAVENOUS | Status: AC

## 2021-10-16 MED ORDER — METRONIDAZOLE 500 MG PO TABS: 500.0000 mg | ORAL_TABLET | Freq: Three times a day (TID) | ORAL | 0 refills | Status: DC

## 2021-10-16 MED ORDER — GERHARDT'S BUTT CREAM: 1.0000 | TOPICAL_CREAM | Freq: Two times a day (BID) | CUTANEOUS | 0 refills | Status: DC

## 2021-10-16 MED ORDER — CIPROFLOXACIN HCL 250 MG PO TABS
500.0000 mg | ORAL_TABLET | Freq: Every day | ORAL | Status: DC
  Administered 2021-10-16 – 2021-10-17 (×2): 500 mg via ORAL
  Filled 2021-10-16 (×2): qty 2

## 2021-10-16 MED ORDER — POTASSIUM CHLORIDE CRYS ER 20 MEQ PO TBCR
40.0000 meq | EXTENDED_RELEASE_TABLET | Freq: Once | ORAL | Status: AC
  Administered 2021-10-16: 40 meq via ORAL
  Filled 2021-10-16: qty 2

## 2021-10-16 MED ORDER — INSULIN ASPART 100 UNIT/ML IJ SOLN: 0.0000 [IU] | Freq: Three times a day (TID) | INTRAMUSCULAR | 11 refills | Status: DC

## 2021-10-16 MED ORDER — RISAQUAD PO CAPS: 1.0000 | ORAL_CAPSULE | Freq: Three times a day (TID) | ORAL | 0 refills | Status: DC

## 2021-10-16 MED ORDER — SENNOSIDES-DOCUSATE SODIUM 8.6-50 MG PO TABS
1.0000 | ORAL_TABLET | Freq: Two times a day (BID) | ORAL | Status: DC
  Administered 2021-10-16 – 2021-10-17 (×3): 1 via ORAL
  Filled 2021-10-16 (×2): qty 1

## 2021-10-16 NOTE — Care Management Important Message (Signed)
Important Message  Patient Details  Name: Riley Griffith MRN: [id number] Date of Birth: 1924-02-26   Medicare Important Message Given:  Yes     Corey Harold 10/16/2021, 1:36 PM

## 2021-10-17 LAB — BASIC METABOLIC PANEL
Anion gap: 5 (ref 5–15)
BUN: 37 mg/dL — ABNORMAL HIGH (ref 8–23)
CO2: 21 mmol/L — ABNORMAL LOW (ref 22–32)
Calcium: 8.1 mg/dL — ABNORMAL LOW (ref 8.9–10.3)
Chloride: 113 mmol/L — ABNORMAL HIGH (ref 98–111)
Creatinine, Ser: 1.8 mg/dL — ABNORMAL HIGH (ref 0.61–1.24)
GFR, Estimated: 34 mL/min — ABNORMAL LOW (ref >60)
Glucose, Bld: 180 mg/dL — ABNORMAL HIGH (ref 70–99)
Potassium: 4 mmol/L (ref 3.5–5.1)
Sodium: 139 mmol/L (ref 135–145)

## 2021-10-17 LAB — CBC
HCT: 32.7 % — ABNORMAL LOW (ref 39.0–52.0)
Hemoglobin: 10.7 g/dL — ABNORMAL LOW (ref 13.0–17.0)
MCH: 31.3 pg (ref 26.0–34.0)
MCHC: 32.7 g/dL (ref 30.0–36.0)
MCV: 95.6 fL (ref 80.0–100.0)
Platelets: 389 10*3/uL (ref 150–400)
RBC: 3.42 MIL/uL — ABNORMAL LOW (ref 4.22–5.81)
RDW: 14.2 % (ref 11.5–15.5)
WBC: 14.3 10*3/uL — ABNORMAL HIGH (ref 4.0–10.5)
nRBC: 0 % (ref 0.0–0.2)

## 2021-10-17 LAB — GLUCOSE, CAPILLARY: Glucose-Capillary: 167 mg/dL — ABNORMAL HIGH (ref 70–99)

## 2021-10-19 ENCOUNTER — Emergency Department (HOSPITAL_COMMUNITY): Payer: HMO

## 2021-10-19 ENCOUNTER — Inpatient Hospital Stay (HOSPITAL_COMMUNITY)
Admission: EM | Admit: 2021-10-19 | Discharge: 2021-10-27 | DRG: 871 | Disposition: A | Discharge status: Hospice | Payer: HMO | Source: Skilled Nursing Facility | Attending: Internal Medicine | Admitting: Internal Medicine

## 2021-10-19 DIAGNOSIS — I700 Atherosclerosis of aorta: Secondary | ICD-10-CM | POA: Diagnosis not present

## 2021-10-19 DIAGNOSIS — R000 Tachycardia, unspecified: Secondary | ICD-10-CM | POA: Diagnosis not present

## 2021-10-19 DIAGNOSIS — J90 Pleural effusion, not elsewhere classified: Secondary | ICD-10-CM | POA: Diagnosis present

## 2021-10-19 DIAGNOSIS — Z20822 Contact with and (suspected) exposure to covid-19: Secondary | ICD-10-CM | POA: Diagnosis present

## 2021-10-19 DIAGNOSIS — K8020 Calculus of gallbladder without cholecystitis without obstruction: Secondary | ICD-10-CM | POA: Diagnosis not present

## 2021-10-19 DIAGNOSIS — E1122 Type 2 diabetes mellitus with diabetic chronic kidney disease: Secondary | ICD-10-CM | POA: Diagnosis present

## 2021-10-19 DIAGNOSIS — R627 Adult failure to thrive: Secondary | ICD-10-CM | POA: Diagnosis present

## 2021-10-19 DIAGNOSIS — R531 Weakness: Secondary | ICD-10-CM | POA: Diagnosis not present

## 2021-10-19 DIAGNOSIS — A419 Sepsis, unspecified organism: Principal | ICD-10-CM | POA: Diagnosis present

## 2021-10-19 DIAGNOSIS — R109 Unspecified abdominal pain: Secondary | ICD-10-CM | POA: Diagnosis not present

## 2021-10-19 DIAGNOSIS — Z66 Do not resuscitate: Secondary | ICD-10-CM | POA: Diagnosis not present

## 2021-10-19 DIAGNOSIS — E119 Type 2 diabetes mellitus without complications: Secondary | ICD-10-CM | POA: Diagnosis not present

## 2021-10-19 DIAGNOSIS — Z515 Encounter for palliative care: Secondary | ICD-10-CM | POA: Diagnosis not present

## 2021-10-19 DIAGNOSIS — I129 Hypertensive chronic kidney disease with stage 1 through stage 4 chronic kidney disease, or unspecified chronic kidney disease: Secondary | ICD-10-CM | POA: Diagnosis present

## 2021-10-19 DIAGNOSIS — R6520 Severe sepsis without septic shock: Secondary | ICD-10-CM | POA: Diagnosis present

## 2021-10-19 DIAGNOSIS — K219 Gastro-esophageal reflux disease without esophagitis: Secondary | ICD-10-CM | POA: Diagnosis present

## 2021-10-19 DIAGNOSIS — G9340 Encephalopathy, unspecified: Secondary | ICD-10-CM | POA: Diagnosis present

## 2021-10-19 DIAGNOSIS — K4090 Unilateral inguinal hernia, without obstruction or gangrene, not specified as recurrent: Secondary | ICD-10-CM | POA: Diagnosis not present

## 2021-10-19 DIAGNOSIS — N1832 Acute kidney failure, unspecified: Secondary | ICD-10-CM | POA: Diagnosis present

## 2021-10-19 DIAGNOSIS — R54 Age-related physical debility: Secondary | ICD-10-CM | POA: Diagnosis present

## 2021-10-19 DIAGNOSIS — D75839 Thrombocytosis, unspecified: Secondary | ICD-10-CM

## 2021-10-19 DIAGNOSIS — Z89511 Acquired absence of right leg below knee: Secondary | ICD-10-CM | POA: Diagnosis not present

## 2021-10-19 DIAGNOSIS — D75838 Other thrombocytosis: Secondary | ICD-10-CM | POA: Diagnosis present

## 2021-10-19 DIAGNOSIS — I10 Essential (primary) hypertension: Secondary | ICD-10-CM | POA: Diagnosis not present

## 2021-10-19 DIAGNOSIS — E861 Hypovolemia: Secondary | ICD-10-CM | POA: Diagnosis present

## 2021-10-19 DIAGNOSIS — J9601 Acute respiratory failure with hypoxia: Secondary | ICD-10-CM | POA: Diagnosis present

## 2021-10-19 DIAGNOSIS — N179 Acute kidney failure, unspecified: Secondary | ICD-10-CM | POA: Diagnosis present

## 2021-10-19 DIAGNOSIS — J690 Pneumonitis due to inhalation of food and vomit: Secondary | ICD-10-CM | POA: Diagnosis present

## 2021-10-19 DIAGNOSIS — R1310 Dysphagia, unspecified: Secondary | ICD-10-CM | POA: Diagnosis present

## 2021-10-19 DIAGNOSIS — Z79899 Other long term (current) drug therapy: Secondary | ICD-10-CM

## 2021-10-19 DIAGNOSIS — K5720 Diverticulitis of large intestine with perforation and abscess without bleeding: Secondary | ICD-10-CM

## 2021-10-19 DIAGNOSIS — E872 Acidosis, unspecified: Secondary | ICD-10-CM | POA: Diagnosis present

## 2021-10-19 DIAGNOSIS — Z794 Long term (current) use of insulin: Secondary | ICD-10-CM | POA: Diagnosis not present

## 2021-10-19 DIAGNOSIS — K5730 Diverticulosis of large intestine without perforation or abscess without bleeding: Secondary | ICD-10-CM | POA: Diagnosis not present

## 2021-10-19 DIAGNOSIS — K5700 Diverticulitis of small intestine with perforation and abscess without bleeding: Secondary | ICD-10-CM | POA: Diagnosis not present

## 2021-10-19 LAB — COMPREHENSIVE METABOLIC PANEL
ALT: 22 U/L (ref 0–44)
AST: 29 U/L (ref 15–41)
Albumin: 2.3 g/dL — ABNORMAL LOW (ref 3.5–5.0)
Alkaline Phosphatase: 104 U/L (ref 38–126)
Anion gap: 14 (ref 5–15)
BUN: 40 mg/dL — ABNORMAL HIGH (ref 8–23)
CO2: 19 mmol/L — ABNORMAL LOW (ref 22–32)
Calcium: 8.4 mg/dL — ABNORMAL LOW (ref 8.9–10.3)
Chloride: 104 mmol/L (ref 98–111)
Creatinine, Ser: 2.39 mg/dL — ABNORMAL HIGH (ref 0.61–1.24)
GFR, Estimated: 24 mL/min — ABNORMAL LOW (ref >60)
Glucose, Bld: 236 mg/dL — ABNORMAL HIGH (ref 70–99)
Potassium: 4.1 mmol/L (ref 3.5–5.1)
Sodium: 137 mmol/L (ref 135–145)
Total Bilirubin: 0.8 mg/dL (ref 0.3–1.2)
Total Protein: 5 g/dL — ABNORMAL LOW (ref 6.5–8.1)

## 2021-10-19 LAB — APTT: aPTT: 29 seconds (ref 24–36)

## 2021-10-19 LAB — PROTIME-INR
INR: 1.5 — ABNORMAL HIGH (ref 0.8–1.2)
Prothrombin Time: 18.2 seconds — ABNORMAL HIGH (ref 11.4–15.2)

## 2021-10-19 MED ORDER — LACTATED RINGERS IV BOLUS (SEPSIS)
500.0000 mL | Freq: Once | INTRAVENOUS | Status: AC
Start: 2021-10-20 — End: 2021-10-20
  Administered 2021-10-20: 500 mL via INTRAVENOUS

## 2021-10-19 MED ORDER — SODIUM CHLORIDE 0.9 % IV SOLN
2.0000 g | Freq: Once | INTRAVENOUS | Status: AC
  Administered 2021-10-19: 2 g via INTRAVENOUS
  Filled 2021-10-19: qty 12.5

## 2021-10-19 MED ORDER — LACTATED RINGERS IV BOLUS (SEPSIS)
1000.0000 mL | Freq: Once | INTRAVENOUS | Status: AC
  Administered 2021-10-19: 1000 mL via INTRAVENOUS

## 2021-10-19 MED ORDER — LACTATED RINGERS IV SOLN: INTRAVENOUS | Status: DC

## 2021-10-19 MED ORDER — LACTATED RINGERS IV BOLUS (SEPSIS)
1000.0000 mL | Freq: Once | INTRAVENOUS | Status: AC
  Administered 2021-10-20: 1000 mL via INTRAVENOUS

## 2021-10-19 MED ORDER — METRONIDAZOLE 500 MG/100ML IV SOLN
500.0000 mg | Freq: Once | INTRAVENOUS | Status: AC
  Administered 2021-10-19: 500 mg via INTRAVENOUS
  Filled 2021-10-19: qty 100

## 2021-10-19 NOTE — Sepsis Progress Note (Signed)
Following per sepsis protocol   

## 2021-10-20 ENCOUNTER — Encounter (HOSPITAL_COMMUNITY): Payer: Self-pay | Admitting: Family Medicine

## 2021-10-20 ENCOUNTER — Other Ambulatory Visit: Payer: Self-pay

## 2021-10-20 ENCOUNTER — Emergency Department (HOSPITAL_COMMUNITY): Payer: HMO

## 2021-10-20 DIAGNOSIS — K5730 Diverticulosis of large intestine without perforation or abscess without bleeding: Secondary | ICD-10-CM | POA: Diagnosis not present

## 2021-10-20 DIAGNOSIS — R627 Adult failure to thrive: Secondary | ICD-10-CM | POA: Diagnosis present

## 2021-10-20 DIAGNOSIS — I129 Hypertensive chronic kidney disease with stage 1 through stage 4 chronic kidney disease, or unspecified chronic kidney disease: Secondary | ICD-10-CM | POA: Diagnosis present

## 2021-10-20 DIAGNOSIS — Z515 Encounter for palliative care: Secondary | ICD-10-CM | POA: Diagnosis not present

## 2021-10-20 DIAGNOSIS — Z66 Do not resuscitate: Secondary | ICD-10-CM | POA: Diagnosis not present

## 2021-10-20 DIAGNOSIS — Z20822 Contact with and (suspected) exposure to covid-19: Secondary | ICD-10-CM | POA: Diagnosis present

## 2021-10-20 DIAGNOSIS — K219 Gastro-esophageal reflux disease without esophagitis: Secondary | ICD-10-CM | POA: Diagnosis present

## 2021-10-20 DIAGNOSIS — Z89511 Acquired absence of right leg below knee: Secondary | ICD-10-CM | POA: Diagnosis not present

## 2021-10-20 DIAGNOSIS — R54 Age-related physical debility: Secondary | ICD-10-CM | POA: Diagnosis present

## 2021-10-20 DIAGNOSIS — I10 Essential (primary) hypertension: Secondary | ICD-10-CM | POA: Diagnosis not present

## 2021-10-20 DIAGNOSIS — R531 Weakness: Secondary | ICD-10-CM | POA: Diagnosis not present

## 2021-10-20 DIAGNOSIS — D75839 Thrombocytosis, unspecified: Secondary | ICD-10-CM | POA: Diagnosis present

## 2021-10-20 DIAGNOSIS — A419 Sepsis, unspecified organism: Secondary | ICD-10-CM

## 2021-10-20 DIAGNOSIS — G9340 Encephalopathy, unspecified: Secondary | ICD-10-CM | POA: Diagnosis present

## 2021-10-20 DIAGNOSIS — J9601 Acute respiratory failure with hypoxia: Secondary | ICD-10-CM | POA: Diagnosis present

## 2021-10-20 DIAGNOSIS — N179 Acute kidney failure, unspecified: Secondary | ICD-10-CM

## 2021-10-20 DIAGNOSIS — E1122 Type 2 diabetes mellitus with diabetic chronic kidney disease: Secondary | ICD-10-CM | POA: Diagnosis present

## 2021-10-20 DIAGNOSIS — E872 Acidosis, unspecified: Secondary | ICD-10-CM | POA: Diagnosis present

## 2021-10-20 DIAGNOSIS — N1832 Chronic kidney disease, stage 3b: Secondary | ICD-10-CM | POA: Diagnosis present

## 2021-10-20 DIAGNOSIS — R1310 Dysphagia, unspecified: Secondary | ICD-10-CM | POA: Diagnosis present

## 2021-10-20 DIAGNOSIS — K8020 Calculus of gallbladder without cholecystitis without obstruction: Secondary | ICD-10-CM | POA: Diagnosis not present

## 2021-10-20 DIAGNOSIS — K4090 Unilateral inguinal hernia, without obstruction or gangrene, not specified as recurrent: Secondary | ICD-10-CM | POA: Diagnosis not present

## 2021-10-20 DIAGNOSIS — J690 Pneumonitis due to inhalation of food and vomit: Secondary | ICD-10-CM | POA: Diagnosis present

## 2021-10-20 DIAGNOSIS — Z79899 Other long term (current) drug therapy: Secondary | ICD-10-CM | POA: Diagnosis not present

## 2021-10-20 DIAGNOSIS — R6520 Severe sepsis without septic shock: Secondary | ICD-10-CM | POA: Diagnosis present

## 2021-10-20 DIAGNOSIS — K5720 Diverticulitis of large intestine with perforation and abscess without bleeding: Secondary | ICD-10-CM | POA: Diagnosis present

## 2021-10-20 DIAGNOSIS — E119 Type 2 diabetes mellitus without complications: Secondary | ICD-10-CM | POA: Diagnosis not present

## 2021-10-20 DIAGNOSIS — Z794 Long term (current) use of insulin: Secondary | ICD-10-CM | POA: Diagnosis not present

## 2021-10-20 DIAGNOSIS — E861 Hypovolemia: Secondary | ICD-10-CM | POA: Diagnosis present

## 2021-10-20 DIAGNOSIS — D75838 Other thrombocytosis: Secondary | ICD-10-CM | POA: Diagnosis present

## 2021-10-20 DIAGNOSIS — J90 Pleural effusion, not elsewhere classified: Secondary | ICD-10-CM | POA: Diagnosis present

## 2021-10-20 LAB — LACTIC ACID, PLASMA
Lactic Acid, Venous: 3 mmol/L (ref 0.5–1.9)
Lactic Acid, Venous: 2.7 mmol/L (ref 0.5–1.9)
Lactic Acid, Venous: 2 mmol/L (ref 0.5–1.9)

## 2021-10-20 LAB — GLUCOSE, CAPILLARY
Glucose-Capillary: 213 mg/dL — ABNORMAL HIGH (ref 70–99)
Glucose-Capillary: 222 mg/dL — ABNORMAL HIGH (ref 70–99)
Glucose-Capillary: 185 mg/dL — ABNORMAL HIGH (ref 70–99)
Glucose-Capillary: 168 mg/dL — ABNORMAL HIGH (ref 70–99)
Glucose-Capillary: 167 mg/dL — ABNORMAL HIGH (ref 70–99)

## 2021-10-20 LAB — CBC WITH DIFFERENTIAL/PLATELET
Abs Immature Granulocytes: 0 10*3/uL (ref 0.00–0.07)
Basophils Absolute: 0 10*3/uL (ref 0.0–0.1)
Basophils Relative: 0 %
Eosinophils Absolute: 0.3 10*3/uL (ref 0.0–0.5)
Eosinophils Relative: 1 %
HCT: 34.9 % — ABNORMAL LOW (ref 39.0–52.0)
Hemoglobin: 11.7 g/dL — ABNORMAL LOW (ref 13.0–17.0)
Lymphocytes Relative: 2 %
Lymphs Abs: 0.6 10*3/uL — ABNORMAL LOW (ref 0.7–4.0)
MCH: 32 pg (ref 26.0–34.0)
MCHC: 33.5 g/dL (ref 30.0–36.0)
MCV: 95.4 fL (ref 80.0–100.0)
Monocytes Absolute: 0.6 10*3/uL (ref 0.1–1.0)
Monocytes Relative: 2 %
Neutro Abs: 30.8 10*3/uL — ABNORMAL HIGH (ref 1.7–7.7)
Neutrophils Relative %: 95 %
Platelets: 476 10*3/uL — ABNORMAL HIGH (ref 150–400)
RBC: 3.66 MIL/uL — ABNORMAL LOW (ref 4.22–5.81)
RDW: 14.4 % (ref 11.5–15.5)
WBC: 32.4 10*3/uL — ABNORMAL HIGH (ref 4.0–10.5)
nRBC: 0 % (ref 0.0–0.2)
nRBC: 0 /100 WBC

## 2021-10-20 LAB — RESP PANEL BY RT-PCR (FLU A&B, COVID) ARPGX2
Influenza A by PCR: NEGATIVE
Influenza B by PCR: NEGATIVE
SARS Coronavirus 2 by RT PCR: NEGATIVE

## 2021-10-20 MED ORDER — LACTATED RINGERS IV BOLUS
1000.0000 mL | Freq: Once | INTRAVENOUS | Status: AC
  Administered 2021-10-20: 1000 mL via INTRAVENOUS

## 2021-10-20 MED ORDER — LACTATED RINGERS IV SOLN: INTRAVENOUS | Status: AC

## 2021-10-20 MED ORDER — ACETAMINOPHEN 325 MG PO TABS: 650.0000 mg | ORAL_TABLET | Freq: Four times a day (QID) | ORAL | Status: DC | PRN

## 2021-10-20 MED ORDER — HEPARIN SODIUM (PORCINE) 5000 UNIT/ML IJ SOLN
5000.0000 [IU] | Freq: Three times a day (TID) | INTRAMUSCULAR | Status: DC
Start: 2021-10-20 — End: 2021-10-26
  Administered 2021-10-20 – 2021-10-26 (×19): 5000 [IU] via SUBCUTANEOUS
  Filled 2021-10-20 (×19): qty 1

## 2021-10-20 MED ORDER — ALBUMIN HUMAN 25 % IV SOLN
25.0000 g | Freq: Once | INTRAVENOUS | Status: AC
  Administered 2021-10-20: 25 g via INTRAVENOUS
  Filled 2021-10-20: qty 100

## 2021-10-20 MED ORDER — ACETAMINOPHEN 650 MG RE SUPP: 650.0000 mg | Freq: Four times a day (QID) | RECTAL | Status: DC | PRN

## 2021-10-20 MED ORDER — SODIUM CHLORIDE 0.9% FLUSH
3.0000 mL | Freq: Two times a day (BID) | INTRAVENOUS | Status: DC
  Administered 2021-10-20 – 2021-10-27 (×12): 3 mL via INTRAVENOUS

## 2021-10-20 MED ORDER — INSULIN ASPART 100 UNIT/ML IJ SOLN
0.0000 [IU] | INTRAMUSCULAR | Status: DC
  Administered 2021-10-20 (×2): 1 [IU] via SUBCUTANEOUS
  Administered 2021-10-20: 2 [IU] via SUBCUTANEOUS
  Administered 2021-10-20: 1 [IU] via SUBCUTANEOUS
  Administered 2021-10-20 – 2021-10-21 (×2): 2 [IU] via SUBCUTANEOUS
  Administered 2021-10-21 (×2): 3 [IU] via SUBCUTANEOUS
  Administered 2021-10-21 (×3): 1 [IU] via SUBCUTANEOUS
  Administered 2021-10-22: 2 [IU] via SUBCUTANEOUS
  Administered 2021-10-22: 3 [IU] via SUBCUTANEOUS
  Administered 2021-10-22: 1 [IU] via SUBCUTANEOUS
  Administered 2021-10-22 – 2021-10-23 (×3): 2 [IU] via SUBCUTANEOUS
  Administered 2021-10-23: 1 [IU] via SUBCUTANEOUS
  Administered 2021-10-23 (×2): 2 [IU] via SUBCUTANEOUS

## 2021-10-20 MED ORDER — ONDANSETRON HCL 4 MG/2ML IJ SOLN
4.0000 mg | Freq: Four times a day (QID) | INTRAMUSCULAR | Status: DC | PRN
  Administered 2021-10-20: 4 mg via INTRAVENOUS
  Filled 2021-10-20: qty 2

## 2021-10-20 MED ORDER — PIPERACILLIN-TAZOBACTAM 3.375 G IVPB 30 MIN
3.3750 g | Freq: Two times a day (BID) | INTRAVENOUS | Status: DC
  Administered 2021-10-20: 3.375 g via INTRAVENOUS
  Filled 2021-10-20: qty 50

## 2021-10-20 MED ORDER — TAMSULOSIN HCL 0.4 MG PO CAPS
0.4000 mg | ORAL_CAPSULE | ORAL | Status: DC
  Administered 2021-10-20 – 2021-10-26 (×7): 0.4 mg via ORAL
  Filled 2021-10-20 (×7): qty 1

## 2021-10-20 MED ORDER — LATANOPROST 0.005 % OP SOLN
1.0000 [drp] | Freq: Every day | OPHTHALMIC | Status: DC
  Administered 2021-10-20 – 2021-10-26 (×7): 1 [drp] via OPHTHALMIC
  Filled 2021-10-20: qty 2.5

## 2021-10-20 MED ORDER — LACTATED RINGERS IV SOLN
INTRAVENOUS | Status: AC
Start: 2021-10-20 — End: 2021-10-21

## 2021-10-20 MED ORDER — SODIUM CHLORIDE 0.9 % IV BOLUS
500.0000 mL | Freq: Once | INTRAVENOUS | Status: AC
  Administered 2021-10-20: 500 mL via INTRAVENOUS

## 2021-10-20 MED ORDER — PIPERACILLIN-TAZOBACTAM IN DEX 2-0.25 GM/50ML IV SOLN
2.2500 g | Freq: Three times a day (TID) | INTRAVENOUS | Status: DC
Start: 2021-10-20 — End: 2021-10-21
  Administered 2021-10-20 – 2021-10-21 (×3): 2.25 g via INTRAVENOUS
  Filled 2021-10-20 (×5): qty 50

## 2021-10-20 NOTE — Evaluation (Signed)
Clinical/Bedside Swallow Evaluation Patient Details  Name: Riley Griffith MRN: [id number] Date of Birth: Jun 16, 1923  Today's Date: 10/20/2021 Time: SLP Start Time (ACUTE ONLY): 1030 SLP Stop Time (ACUTE ONLY): 1050 SLP Time Calculation (min) (ACUTE ONLY): 20 min  Past Medical History:  Past Medical History:  Diagnosis Date   Diabetes mellitus    GERD (gastroesophageal reflux disease)    Hypertension    Osteomyelitis of foot (HCC)    Renal disorder    see Dr Robet Leu   Past Surgical History:  Past Surgical History:  Procedure Laterality Date   AMPUTATION  01/25/2012   Procedure: AMPUTATION BELOW KNEE;  Surgeon: Budd Palmer, MD;  Location: [facility] OR;  Service: Orthopedics;  Laterality: Right;  REVISION OF BELOW KNEE  AMPUTATION    APPLICATION OF WOUND VAC  01/25/2012   Procedure: APPLICATION OF WOUND VAC;  Surgeon: Budd Palmer, MD;  Location: [facility] OR;  Service: Orthopedics;  Laterality: N/A;  AND PLACEMENT OF WOUND VAC    LEG AMPUTATION BELOW KNEE     rt knee   HPI:  Riley Griffith is a pleasant 88 y.o. male admitted with declining health from SNF.  SNF noted recurrent episodes of vomiting feculent material. Son has observed pt to gag with pills and water, maybe about 6 weeks of apparent trouble with digestion. Pt with medical history significant for insulin-dependent diabetes mellitus, hypertension, CKD stage IIIb, and recent admission for sigmoid diverticulitis with abscess.    Assessment / Plan / Recommendation  Clinical Impression  Pt seen with son at bedside. Pt has been vomiting or regurgitating (unclear if one or the other or both) for about 6 weeks. Has lost 6 lbs since January. He has recently been diagnosed with diverticulitis and an abcess, which is likely a contributing factor. Today pt is able to take sips of water and several oz of puree without overt signs of aspiration, but does have some delayed coughing. Son at bedside repeatedly confirms that he wants his father to  have access to water and ice cream, knows that he would not be a candidate for invasive procedures. When asked about his code status son reports that the pt should be a DNR and has previously submitted the paperwork for that. Currently is marked as a full code. GIven findings and son report, suggest starting a full liquid diet with esophageal and aspiration precautions with consideration to advance to a bland diet if pt tolerates fulls (MD to confirm and write order for fulls if in agreement). Pt to sit upright and stay always at 30 degress HOB, consume 5-7 small meals or snacks, with rest breaks as needed if pt starts coughing. Pills to be given crushed. Will f/u for tolerance. SIgns posted. SLP Visit Diagnosis: Dysphagia, unspecified (R13.10)    Aspiration Risk  Moderate aspiration risk    Diet Recommendation Thin liquid (full liquid)   Liquid Administration via: Cup;Straw Medication Administration: Crushed with puree Supervision: Staff to assist with self feeding Compensations: Slow rate;Small sips/bites Postural Changes: Seated upright at 90 degrees;Remain upright for at least 30 minutes after po intake    Other  Recommendations Oral Care Recommendations: Oral care BID Other Recommendations: Have oral suction available    Recommendations for follow up therapy are one component of a multi-disciplinary discharge planning process, led by the attending physician.  Recommendations may be updated based on patient status, additional functional criteria and insurance authorization.  Follow up Recommendations Skilled nursing-short term rehab (<3 hours/day)  Assistance Recommended at Discharge    Functional Status Assessment Patient has had a recent decline in their functional status and demonstrates the ability to make significant improvements in function in a reasonable and predictable amount of time.  Frequency and Duration min 2x/week  2 weeks       Prognosis Prognosis for Safe Diet  Advancement: Good Barriers to Reach Goals: Severity of deficits      Swallow Study   General HPI: Riley Griffith is a pleasant 88 y.o. male admitted with declining health from SNF.  SNF noted recurrent episodes of vomiting feculent material. Son has observed pt to gag with pills and water, maybe about 6 weeks of apparent trouble with digestion. Pt with medical history significant for insulin-dependent diabetes mellitus, hypertension, CKD stage IIIb, and recent admission for sigmoid diverticulitis with abscess. Type of Study: Bedside Swallow Evaluation Diet Prior to this Study: NPO Temperature Spikes Noted: No Respiratory Status: Venti-mask History of Recent Intubation: No Behavior/Cognition: Alert;Cooperative;Pleasant mood Oral Cavity Assessment: Within Functional Limits Oral Care Completed by SLP: No Oral Cavity - Dentition: Adequate natural dentition Vision: Functional for self-feeding Self-Feeding Abilities: Needs assist Patient Positioning: Upright in bed Baseline Vocal Quality: Hoarse;Breathy Volitional Cough: Strong Volitional Swallow: Able to elicit    Oral/Motor/Sensory Function Overall Oral Motor/Sensory Function: Within functional limits   Ice Chips     Thin Liquid Thin Liquid: Within functional limits Presentation: Straw;Cup    Nectar Thick Nectar Thick Liquid: Not tested   Honey Thick Honey Thick Liquid: Not tested   Puree Puree: Within functional limits Presentation: Spoon   Solid     Solid: Not tested      Claudine Mouton 10/20/2021,11:42 AM

## 2021-10-20 NOTE — Consult Note (Signed)
Consultation Note Date: 10/20/2021   Patient Name: Riley Griffith  DOB: 02-25-1924  MRN: [id number]  Age / Sex: 88 y.o., male  PCP: [name], MD Referring Physician: Patrecia Pour, MD  Reason for Consultation: Establishing goals of care and Psychosocial/spiritual support  HPI/Patient Profile: 88 y.o. male  admitted on 10/19/2021 with medical history significant for insulin-dependent diabetes mellitus, hypertension, CKD stage IIIb, and recent admission for sigmoid diverticulitis with abscess, now returning to the emergency department from his SNF with recurrent bouts of nausea and vomiting.  Patient is disoriented and unable to contribute much to the history.  His son was reached by phone and notes that patient's health has been declining rapidly over the past month, he has had dry heaves frequently while trying to swallow, actually seem to be doing a bit better 2 days ago, but then yesterday was more confused than usual.  SNF then noted recurrent episodes of vomiting fecal material.  Admitted for treatment and stabilization  Family face treatment option decisions, advanced directive decisions and anticipatory care needs.  Clinical Assessment and Goals of Care:    This NP Wadie Lessen reviewed medical records, received report from team, assessed the patient and then meet at the patient's bedside along with his daughter/Riley Griffith to discuss diagnosis, prognosis, GOC, EOL wishes disposition and options.   Education offered on the concept of palliative Care as specialized medical care for people and their families living with serious illness.  It focuses on providing relief from the symptoms and stress of a serious illness.  The goal is to improve quality of life for both the patient and the family.  Values and goals of care important to patient and family were attempted to be elicited.  Created space and  opportunity for family to explore thoughts and feelings regarding current medical situation.  Daughter verbalizes an understanding that her father has had continued physical, functional and cognitive decline over the past several months and recognizes that this change is likely reflective of a poor prognosis.     A  discussion was had today regarding advanced directives.  Concepts specific to code status, artifical feeding and hydration, continued IV antibiotics and rehospitalization was had.  The difference between a aggressive medical intervention path  and a palliative comfort care path for this patient at this time was had.    Education offered on hospice benefit; philosophy and eligibility   MOST form introduced and [employer] booklet left for review    Questions and concerns addressed.    Daughter present today verbalizes her thoughts that shifting to a comfort path for Riley Griffith at this time is in his best interest, however she wishes to speak to her brother before any decisions are made.   PMT will continue to support holistically.     Later in the day Riley Griffith call me back, her brother is unable to make that decision today and agrees to meet with the palliative medicine team tomorrow morning at 930.     No documented  healthcare power of attorney, patient's 2 children work in unison for medical decisions.  Patient has a living spouse with end-stage dementia  SUMMARY OF RECOMMENDATIONS    Code Status/Advance Care Planning: DNR    Palliative Prophylaxis:  Aspiration, Bowel Regimen, Delirium Protocol, Frequent Pain Assessment, and Oral Care  Additional Recommendations (Limitations, Scope, Preferences): Full Scope Treatment  Psycho-social/Spiritual:  Desire for further Chaplaincy support:yes Additional Recommendations: Education on Hospice  Prognosis:  Unable to determine  Discharge Planning: To Be Determined      Primary Diagnoses: Present on Admission:   Aspiration pneumonia ([place])  HTN (hypertension)  Acute renal failure superimposed on stage 3b chronic kidney disease ([place])  Acute respiratory failure with hypoxia (HCC)  Severe sepsis ([place])   I have reviewed the medical record, interviewed the patient and family, and examined the patient. The following aspects are pertinent.  Past Medical History:  Diagnosis Date   Diabetes mellitus    GERD (gastroesophageal reflux disease)    Hypertension    Osteomyelitis of foot (HCC)    Renal disorder    see Dr Tawni Carnes   Social History   Socioeconomic History   Marital status: Married    Spouse name: Not on file   Number of children: Not on file   Years of education: Not on file   Highest education level: Not on file  Occupational History   Not on file  Tobacco Use   Smoking status: Never   Smokeless tobacco: Never  Vaping Use   Vaping Use: Never used  Substance and Sexual Activity   Alcohol use: No   Drug use: No   Sexual activity: Never  Other Topics Concern   Not on file  Social History Narrative   Not on file   Social Determinants of Health   Financial Resource Strain: Not on file  Food Insecurity: Not on file  Transportation Needs: Not on file  Physical Activity: Not on file  Stress: Not on file  Social Connections: Not on file   History reviewed. No pertinent family history. Scheduled Meds:  heparin  5,000 Units Subcutaneous Q8H   insulin aspart  0-6 Units Subcutaneous Q4H   latanoprost  1 drop Left Eye QHS   sodium chloride flush  3 mL Intravenous Q12H   tamsulosin  0.4 mg Oral PC supper   Continuous Infusions:  lactated ringers 150 mL/hr at 10/20/21 0703   piperacillin-tazobactam (ZOSYN)  IV     PRN Meds:.acetaminophen **OR** acetaminophen, ondansetron (ZOFRAN) IV Medications Prior to Admission:  Prior to Admission medications   Medication Sig Start Date End Date Taking? Authorizing Provider  acetaminophen (TYLENOL) 325 MG tablet Take 2 tablets (650 mg  total) by mouth every 6 (six) hours as needed for mild pain, fever or headache (or Fever >/= 101). 11/19/18  Yes Emokpae, Courage, MD  amLODipine (NORVASC) 5 MG tablet Take 5 mg by mouth at bedtime. 09/17/21  Yes [provider]  bimatoprost (LUMIGAN) 0.01 % SOLN USE 1 DROP TO LEFT EYE AT BEDTIME. Patient taking differently: Place 1 drop into the left eye at bedtime. 07/30/21  Yes Rankin, Clent Demark, MD  calcitRIOL (ROCALTROL) 0.25 MCG capsule Take 0.25 mcg by mouth daily.   Yes [provider]  cholecalciferol (VITAMIN D) 1000 UNITS tablet Take 1,000 Units by mouth daily.     Yes [provider]  ciprofloxacin (CIPRO) 500 MG tablet Take 1 tablet (500 mg total) by mouth daily with breakfast for 5 days. Patient taking differently: Take 500  mg by mouth See admin instructions. Qd x 5 days 10/17/21 10/22/21 Yes Shahmehdi, Seyed A, MD  furosemide (LASIX) 20 MG tablet Take 20 mg by mouth daily. For swelling    Yes [provider]  insulin aspart (NOVOLOG) 100 UNIT/ML injection Inject 0-6 Units into the skin 3 (three) times daily with meals. Patient taking differently: Inject 0-6 Units into the skin See admin instructions. 3 times daily with meals per sliding scale 70-150= 0 units 151-200 =1 unit  201-250= 2 units 251-300 = 3 units 301-350 = 4 units 351-400 =5 units >400 give 6 units and call MD 10/16/21  Yes Shahmehdi, Seyed A, MD  insulin glargine (LANTUS) 100 UNIT/ML injection Inject 0.05 mLs (5 Units total) into the skin at bedtime. 5-6 PM 10/16/21  Yes Shahmehdi, Seyed A, MD  Lactobacillus (ACIDOPHILUS/PECTIN PO) Take 1 capsule by mouth 3 (three) times daily with meals.   Yes [provider]  metroNIDAZOLE (FLAGYL) 500 MG tablet Take 1 tablet (500 mg total) by mouth every 8 (eight) hours for 5 days. Patient taking differently: Take 500 mg by mouth every 8 (eight) hours. Q 8 hours x 5 days 10/16/21 10/21/21 Yes Shahmehdi, Seyed A, MD  Nutritional Supplements  (NUTRITIONAL SUPPLEMENT PO) Take 120 mLs by mouth daily. NAS med pass   Yes [provider]  Nystatin (GERHARDT'S BUTT CREAM) CREA Apply 1 Application topically 2 (two) times daily for 10 days. Patient taking differently: Apply 1 Application topically See admin instructions. Bid x 10 days 10/16/21 10/26/21 Yes Shahmehdi, Seyed A, MD  sodium bicarbonate 650 MG tablet Take 650 mg by mouth 2 (two) times daily.   Yes [provider]  Tamsulosin HCl (FLOMAX) 0.4 MG CAPS Take 0.4 mg by mouth daily after supper.     Yes [provider]  acidophilus (RISAQUAD) CAPS capsule Take 1 capsule by mouth 3 (three) times daily with meals for 7 days. Patient not taking: Reported on 10/20/2021 10/16/21 10/23/21  Deatra Vernon, MD   No Known Allergies Review of Systems  Unable to perform ROS: Acuity of condition    Physical Exam Constitutional:      Appearance: He is underweight. He is ill-appearing.     Interventions: Nasal cannula in place.  Cardiovascular:     Rate and Rhythm: Normal rate.  Skin:    General: Skin is warm and dry.  Neurological:     Mental Status: He is lethargic.     Vital Signs: BP 117/64 (BP Location: Right Arm)   Pulse 94   Temp 98.3 F (36.8 C) (Oral)   Resp (!) 25   SpO2 96%          SpO2: SpO2: 96 % O2 Device:SpO2: 96 % O2 Flow Rate: .O2 Flow Rate (L/min): 14 L/min  IO: Intake/output summary:  Intake/Output Summary (Last 24 hours) at 10/20/2021 1228 Last data filed at 10/20/2021 1610 Gross per 24 hour  Intake 3700 ml  Output --  Net 3700 ml    LBM: Last BM Date : 10/20/21 Baseline Weight:   Most recent weight:       Palliative Assessment/Data: 30%    Discussed with Dr Auburn Bilberry  Signed by: Wadie Lessen, NP   Please contact Palliative Medicine Team phone at [phone] for questions and concerns.  For individual provider: See Shea Evans

## 2021-10-20 NOTE — Sepsis Progress Note (Signed)
Sepsis bundle complete.  Pt admitted, monitoring to be continued by bedside RN

## 2021-10-20 NOTE — ED Notes (Signed)
Pt calling out stating he wanted to get up. This RN walked in room and noticed pt removed NRB, NRB placed back on pt. O2 sats 95% while on NRB. Pt educated about safety of staying in bed at this time.

## 2021-10-20 NOTE — Progress Notes (Signed)
Pharmacy Antibiotic Note  Riley Griffith is a 88 y.o. male admitted on 10/19/2021 with aspiration pneumonia and diverticular abscess.  Pharmacy has been consulted for Zosyn dosing.  Pt with AKI.  Plan: Rec'd cefepime and metronidazole in ED. Zosyn 3.375g IV Q12H (4-hour infusion); will increase to Q8H if renal function improves.  Temp (24hrs), Avg:100.4 F (38 C), Min:100.4 F (38 C), Max:100.4 F (38 C)  Recent Labs  Lab 10/14/21 0426 10/15/21 0455 10/16/21 0502 10/17/21 0459 10/19/21 2313 10/20/21 0135  WBC 12.9* 13.2* 13.0* 14.3* 32.4*  --   CREATININE 1.84* 1.88* 1.71* 1.80* 2.39*  --   LATICACIDVEN  --   --   --   --  3.0* 2.7*    Estimated Creatinine Clearance: 19.3 mL/min (A) (by C-G formula based on SCr of 2.39 mg/dL (H)).    No Known Allergies   Thank you for allowing pharmacy to be a part of this patient's care.  Vernard Gambles, PharmD, BCPS  10/20/2021 3:28 AM

## 2021-10-21 DIAGNOSIS — E119 Type 2 diabetes mellitus without complications: Secondary | ICD-10-CM | POA: Diagnosis not present

## 2021-10-21 DIAGNOSIS — R627 Adult failure to thrive: Secondary | ICD-10-CM | POA: Diagnosis not present

## 2021-10-21 DIAGNOSIS — R531 Weakness: Secondary | ICD-10-CM

## 2021-10-21 DIAGNOSIS — N179 Acute kidney failure, unspecified: Secondary | ICD-10-CM | POA: Diagnosis not present

## 2021-10-21 DIAGNOSIS — Z794 Long term (current) use of insulin: Secondary | ICD-10-CM | POA: Diagnosis not present

## 2021-10-21 DIAGNOSIS — J690 Pneumonitis due to inhalation of food and vomit: Secondary | ICD-10-CM | POA: Diagnosis not present

## 2021-10-21 LAB — BASIC METABOLIC PANEL
Anion gap: 10 (ref 5–15)
BUN: 38 mg/dL — ABNORMAL HIGH (ref 8–23)
CO2: 20 mmol/L — ABNORMAL LOW (ref 22–32)
Calcium: 8 mg/dL — ABNORMAL LOW (ref 8.9–10.3)
Chloride: 110 mmol/L (ref 98–111)
Creatinine, Ser: 1.89 mg/dL — ABNORMAL HIGH (ref 0.61–1.24)
GFR, Estimated: 32 mL/min — ABNORMAL LOW (ref >60)
Glucose, Bld: 149 mg/dL — ABNORMAL HIGH (ref 70–99)
Potassium: 3.7 mmol/L (ref 3.5–5.1)
Sodium: 140 mmol/L (ref 135–145)

## 2021-10-21 LAB — CBC
HCT: 26.6 % — ABNORMAL LOW (ref 39.0–52.0)
Hemoglobin: 8.9 g/dL — ABNORMAL LOW (ref 13.0–17.0)
MCH: 32 pg (ref 26.0–34.0)
MCHC: 33.5 g/dL (ref 30.0–36.0)
MCV: 95.7 fL (ref 80.0–100.0)
Platelets: 388 10*3/uL (ref 150–400)
RBC: 2.78 MIL/uL — ABNORMAL LOW (ref 4.22–5.81)
RDW: 14.7 % (ref 11.5–15.5)
WBC: 32.8 10*3/uL — ABNORMAL HIGH (ref 4.0–10.5)
nRBC: 0 % (ref 0.0–0.2)

## 2021-10-21 LAB — GLUCOSE, CAPILLARY
Glucose-Capillary: 154 mg/dL — ABNORMAL HIGH (ref 70–99)
Glucose-Capillary: 181 mg/dL — ABNORMAL HIGH (ref 70–99)
Glucose-Capillary: 175 mg/dL — ABNORMAL HIGH (ref 70–99)
Glucose-Capillary: 206 mg/dL — ABNORMAL HIGH (ref 70–99)
Glucose-Capillary: 252 mg/dL — ABNORMAL HIGH (ref 70–99)
Glucose-Capillary: 263 mg/dL — ABNORMAL HIGH (ref 70–99)

## 2021-10-21 LAB — HEMOGLOBIN A1C
Hgb A1c MFr Bld: 7.6 % — ABNORMAL HIGH (ref 4.8–5.6)
Mean Plasma Glucose: 171 mg/dL

## 2021-10-21 MED ORDER — PIPERACILLIN-TAZOBACTAM 3.375 G IVPB
3.3750 g | Freq: Three times a day (TID) | INTRAVENOUS | Status: DC
  Administered 2021-10-21 – 2021-10-26 (×15): 3.375 g via INTRAVENOUS
  Filled 2021-10-21 (×14): qty 50

## 2021-10-21 NOTE — Progress Notes (Signed)
Patient ID: PHIL MICHELS, male   DOB: [birth year], 88 y.o.   MRN: [id number]    Progress Note from the Palliative Medicine Team at [facility]   Patient Name: Riley Griffith        Date: 10/21/2021 DOB: 07-29-1923  Age: 88 y.o. MRN#: [id number] Attending Physician: Geradine Girt, DO Primary Care Physician: [name], MD Admit Date: 10/19/2021   Medical records reviewed, discussed with treatment team   This NP assessed patient and then met at the bedside with patient's son and daughter as scheduled for continued conversation regarding current medical situation; treatment option decisions, advanced directive decisions and anticipatory care needs.  Education offered on patient's assessment specific to aspiration pneumonia and severe sepsis.  Patient with leukocytosis, poor p.o. intake, weakness, dysphagia and high risk for continued aspiration and decompensation.  Family brought document in for scanning specific to declaration of a natural death.  Education offered on the details of Mr. Esselman living will.  Education offered on concept specific to human mortality and adult failure to thrive and the limitations of medical interventions to prolong quality of life when the body fails to thrive.  Today family both are in consensus that quality is of utmost importance to this patient, and that they do not want him to linger and suffer.  However at this time they wish to  continue current medical interventions, treating the treatable, as they  process the medical situation and make decisions regarding treatment plan.  Education offered different cream and aggressive medical intervention path versus a palliative comfort path for this patient,Patient at this time,In this situation. Education offered on hospice benefit; philosophy and eligibility   Education offered today regarding  the importance of continued conversation with family and their  medical providers regarding overall plan of care  and treatment options,  ensuring decisions are within the context of the patients values and GOCs.  Questions and concerns addressed   Discussed with Dr Eliseo Squires  PMT will continue to support holistically.  This NP will follow-up in the morning   Wadie Lessen NP  Palliative Medicine Team Team Phone # [phone] Pager [phone]

## 2021-10-21 NOTE — Progress Notes (Signed)
Pharmacy Antibiotic Note  Riley Griffith is a 88 y.o. male admitted on 10/19/2021 with aspiration pneumonia and diverticular abscess.  Pharmacy has been consulted for Zosyn dosing.    SCr trending down to 1.89 WBC 32.8, afebrile  Plan: Increase Zosyn to 3.375g IV Q8H (4-hour infusion)  Temp (24hrs), Avg:98.2 F (36.8 C), Min:97.6 F (36.4 C), Max:98.7 F (37.1 C)  Recent Labs  Lab 10/15/21 0455 10/16/21 0502 10/17/21 0459 10/19/21 2313 10/20/21 0135 10/20/21 0728 10/21/21 0040  WBC 13.2* 13.0* 14.3* 32.4*  --   --  32.8*  CREATININE 1.88* 1.71* 1.80* 2.39*  --   --  1.89*  LATICACIDVEN  --   --   --  3.0* 2.7* 2.0*  --      Estimated Creatinine Clearance: 24.3 mL/min (A) (by C-G formula based on SCr of 1.89 mg/dL (H)).    No Known Allergies  Thank you for allowing pharmacy to be a part of this patient's care.  Luisa Hart, PharmD, BCPS Clinical Pharmacist 10/21/2021 2:22 PM   Please refer to [facility] for pharmacy phone number

## 2021-10-21 NOTE — Progress Notes (Signed)
PROGRESS NOTE    RAKIM MOONE  MHD:[id number] DOB: [birth year] DOA: 10/19/2021 PCP: [name], MD    Brief Narrative:  Riley Griffith is a 88 y.o. male with a history of IDT2DM, HTN, stage IIIb CKD, and recent admission (discharged 6/23) for sigmoid diverticulitis with abscess who presented to the ED 6/26 from SNF with encephalopathy, vomiting. In the ED he was febrile in respiratory distress requiring 15L by NRB, hypotensive to 69/50 with WBC 32.4k, reactive thrombocytosis, and lactic acidosis (level 3.0). CT abd/pelvis confirmed acute sigmoid diverticulitis with 2.8 x 2.2 cm abscess, air in bladder as well as bibasilar pulmonary consolidations and effusions. IV antibiotics given for aspiration pneumonia to also cover the abscess, IV fluids given, SLP evaluation requested, and the patient was admitted this morning by Dr. Myna Hidalgo. Respiratory status has stabilized. Family has confirmed the patient's reported desire to be DNR status and not to escalate care from the current level.     Assessment and Plan: Aspiration pneumonia; acute hypoxic respiratory failure; severe sepsis  - Discharged to SNF on 6/23 after inpatient conservative treatment of diverticulitis with abscess, now returning with feculent vomiting - CT abd/pelvis concerning for bilateral lower lobe consolidations  - He is febrile with increased leukocytosis, acute hypoxic respiratory failure, lactic acidosis, and acute encephalopathy  - Blood cultures were collected in ED, 3.5 liters IVF was given, and he was started on antibiotics  - palliative care consult   Sigmoid diverticulitis with abscess  - WBC had trended down and symptoms improved with treatment during recent admission; CT now demonstrates persistent inflammatory change and abscess though it appears smaller  - Suspect the primary problem is now aspiration PNA  - Continue Zosyn     AKI superimposed on CKD IIIb - SCr is 2.39 on admission, up from 1.80 on 10/17/21 and now  back to baseline  - No hydronephrosis on CT in ED  - Likely acute prerenal injury, possibly ATN, in setting of sepsis, hypotension, N/V with hypovolemia  - He has been fluid-resuscitated in ED  - Avoid hypotension, renally-dose medications, serial chem panels    Hypertension  - Hypotensive in ED  - antihypertensives will be held      Insulin-dependent DM  - SSI     DVT prophylaxis: heparin injection 5,000 Units Start: 10/20/21 0630    Code Status: DNR   Disposition Plan:  Level of care: Progressive Status is: Inpatient Remains inpatient appropriate because: needs [place] conversation    Consultants:  Palliative care   Subjective: No current complaints  Objective: Vitals:   10/20/21 1929 10/20/21 2310 10/21/21 0325 10/21/21 0723  BP: 122/65 110/60 (!) 113/53 104/63  Pulse: 92 86 79 98  Resp: 20 (!) 22 (!) 21 20  Temp: 98.3 F (36.8 C) 98.7 F (37.1 C) 98.3 F (36.8 C) 98 F (36.7 C)  TempSrc: Axillary Axillary Oral Oral  SpO2: 97% 96% 96% 94%    Intake/Output Summary (Last 24 hours) at 10/21/2021 0843 Last data filed at 10/21/2021 0330 Gross per 24 hour  Intake 993.01 ml  Output 550 ml  Net 443.01 ml   There were no vitals filed for this visit.  Examination:   General: Appearance:    Frail/elderly male in no acute distress     Lungs:     On [place], diminished, respirations unlabored  Heart:    Normal heart rate. Normal rhythm. No murmurs, rubs, or gallops.       Neurologic:   Awake, alert, pleasant  and cooperative       Data Reviewed: I have personally reviewed following labs and imaging studies  CBC: Recent Labs  Lab 10/15/21 0455 10/16/21 0502 10/17/21 0459 10/19/21 2313 10/21/21 0040  WBC 13.2* 13.0* 14.3* 32.4* 32.8*  NEUTROABS  --   --   --  30.8*  --   HGB 10.5* 10.4* 10.7* 11.7* 8.9*  HCT 32.2* 32.5* 32.7* 34.9* 26.6*  MCV 95.8 96.2 95.6 95.4 95.7  PLT 310 321 389 476* 242   Basic Metabolic Panel: Recent Labs  Lab 10/15/21 0455  10/16/21 0502 10/17/21 0459 10/19/21 2313 10/21/21 0040  NA 138 141 139 137 140  K 3.3* 3.4* 4.0 4.1 3.7  CL 111 115* 113* 104 110  CO2 20* 21* 21* 19* 20*  GLUCOSE 85 69* 180* 236* 149*  BUN 46* 38* 37* 40* 38*  CREATININE 1.88* 1.71* 1.80* 2.39* 1.89*  CALCIUM 8.0* 7.8* 8.1* 8.4* 8.0*   GFR: Estimated Creatinine Clearance: 24.3 mL/min (A) (by C-G formula based on SCr of 1.89 mg/dL (H)). Liver Function Tests: Recent Labs  Lab 10/19/21 2313  AST 29  ALT 22  ALKPHOS 104  BILITOT 0.8  PROT 5.0*  ALBUMIN 2.3*   No results for input(s): "LIPASE", "AMYLASE" in the last 168 hours. No results for input(s): "AMMONIA" in the last 168 hours. Coagulation Profile: Recent Labs  Lab 10/19/21 2313  INR 1.5*   Cardiac Enzymes: No results for input(s): "CKTOTAL", "CKMB", "CKMBINDEX", "TROPONINI" in the last 168 hours. BNP (last 3 results) No results for input(s): "PROBNP" in the last 8760 hours. HbA1C: Recent Labs    10/20/21 0728  HGBA1C 7.6*   CBG: Recent Labs  Lab 10/20/21 1542 10/20/21 2019 10/20/21 2317 10/21/21 0336 10/21/21 0738  GLUCAP 185* 168* 167* 154* 181*   Lipid Profile: No results for input(s): "CHOL", "HDL", "LDLCALC", "TRIG", "CHOLHDL", "LDLDIRECT" in the last 72 hours. Thyroid Function Tests: No results for input(s): "TSH", "T4TOTAL", "FREET4", "T3FREE", "THYROIDAB" in the last 72 hours. Anemia Panel: No results for input(s): "VITAMINB12", "FOLATE", "FERRITIN", "TIBC", "IRON", "RETICCTPCT" in the last 72 hours. Sepsis Labs: Recent Labs  Lab 10/19/21 2313 10/20/21 0135 10/20/21 0728  LATICACIDVEN 3.0* 2.7* 2.0*    Recent Results (from the past 240 hour(s))  Blood Culture (routine x 2)     Status: None (Preliminary result)   Collection Time: 10/19/21 11:13 PM   Specimen: BLOOD RIGHT FOREARM  Result Value Ref Range Status   Specimen Description BLOOD RIGHT FOREARM  Final   Special Requests   Final    BOTTLES DRAWN AEROBIC AND ANAEROBIC Blood  Culture adequate volume   Culture   Final    NO GROWTH 2 DAYS Performed at [facility], [place] [address] [place], [place] [address]    Report Status PENDING  Incomplete  Blood Culture (routine x 2)     Status: None (Preliminary result)   Collection Time: 10/19/21 11:34 PM   Specimen: BLOOD  Result Value Ref Range Status   Specimen Description BLOOD SITE NOT SPECIFIED  Final   Special Requests   Final    BOTTLES DRAWN AEROBIC AND ANAEROBIC Blood Culture adequate volume   Culture   Final    NO GROWTH 2 DAYS Performed at [facility], [address] [place], [place] [address]    Report Status PENDING  Incomplete  Resp Panel by RT-PCR (Flu A&B, Covid) Anterior Nasal Swab     Status: None   Collection Time: 10/19/21 11:46  PM   Specimen: Anterior Nasal Swab  Result Value Ref Range Status   SARS Coronavirus 2 by RT PCR NEGATIVE NEGATIVE Final    Comment: (NOTE) SARS-CoV-2 target nucleic acids are NOT DETECTED.  The SARS-CoV-2 RNA is generally detectable in upper respiratory specimens during the acute phase of infection. The lowest concentration of SARS-CoV-2 viral copies this assay can detect is 138 copies/mL. A negative result does not preclude SARS-Cov-2 infection and should not be used as the sole basis for treatment or other patient management decisions. A negative result may occur with  improper specimen collection/handling, submission of specimen other than nasopharyngeal swab, presence of viral mutation(s) within the areas targeted by this assay, and inadequate number of viral copies(<138 copies/mL). A negative result must be combined with clinical observations, patient history, and epidemiological information. The expected result is Negative.  Fact Sheet for Patients:  EntrepreneurPulse.com.au  Fact Sheet for Healthcare Providers:  IncredibleEmployment.be  This test is no t yet approved or cleared by the [place] FDA  and  has been authorized for detection and/or diagnosis of SARS-CoV-2 by FDA under an Emergency Use Authorization (EUA). This EUA will remain  in effect (meaning this test can be used) for the duration of the COVID-19 declaration under Section 564(b)(1) of the Act, 21 U.S.C.section 360bbb-3(b)(1), unless the authorization is terminated  or revoked sooner.       Influenza A by PCR NEGATIVE NEGATIVE Final   Influenza B by PCR NEGATIVE NEGATIVE Final    Comment: (NOTE) The Xpert Xpress SARS-CoV-2/FLU/RSV plus assay is intended as an aid in the diagnosis of influenza from Nasopharyngeal swab specimens and should not be used as a sole basis for treatment. Nasal washings and aspirates are unacceptable for Xpert Xpress SARS-CoV-2/FLU/RSV testing.  Fact Sheet for Patients: EntrepreneurPulse.com.au  Fact Sheet for Healthcare Providers: IncredibleEmployment.be  This test is not yet approved or cleared by the [place] FDA and has been authorized for detection and/or diagnosis of SARS-CoV-2 by FDA under an Emergency Use Authorization (EUA). This EUA will remain in effect (meaning this test can be used) for the duration of the COVID-19 declaration under Section 564(b)(1) of the Act, 21 U.S.C. section 360bbb-3(b)(1), unless the authorization is terminated or revoked.  Performed at [facility], [place] [address] [place], [place] [address]          Radiology Studies: CT ABDOMEN PELVIS WO CONTRAST  Result Date: 10/20/2021 CLINICAL DATA:  Brown emesis. EXAM: CT ABDOMEN AND PELVIS WITHOUT CONTRAST TECHNIQUE: Multidetector CT imaging of the abdomen and pelvis was performed following the standard protocol without IV contrast. RADIATION DOSE REDUCTION: This exam was performed according to the departmental dose-optimization program which includes automated exposure control, adjustment of the mA and/or kV according to patient size and/or use of  iterative reconstruction technique. COMPARISON:  October 11, 2021 and November 15, 2018 FINDINGS: Lower chest: Moderate to marked severity bilateral lower lobe consolidation is seen. This is increased in severity when compared to the prior study. There is a moderate sized right-sided pleural effusion. A small left pleural effusion is also noted. These are both increased in size when compared to the prior exam. Hepatobiliary: No focal liver abnormality is seen. Adjacent 4 mm gallstones are seen within the lumen of a contracted gallbladder. There is no evidence of biliary dilatation. Pancreas: A stable 2.2 cm x 2.4 cm x 2.3 cm low-attenuation lesion is seen along the junction of the body and tail of the pancreas (approximately -6.61 Hounsfield units/axial  CT image 30, CT series 3). No peripancreatic inflammatory fat stranding or pancreatic ductal dilatation is identified. Spleen: Normal in size without focal abnormality. Adrenals/Urinary Tract: Adrenal glands are unremarkable. Kidneys are mildly atrophic, without renal calculi, focal lesion, or hydronephrosis. A small amount of air is seen within the anterior aspect the urinary bladder. This represents a new finding. Stomach/Bowel: There is a small to moderate-sized hiatal hernia. The appendix is not identified. There is no evidence of bowel dilatation. Moderately inflamed diverticula are seen within the proximal sigmoid colon. Associated pericolonic inflammatory fat stranding is noted. A 2.8 cm x 2.2 cm pericolonic abscess is again seen within the left pelvis (axial CT images 56 through 62, CT series 3). This area measured 3.1 cm x 2.8 cm on the prior study. Vascular/Lymphatic: Aortic atherosclerosis. No enlarged abdominal or pelvic lymph nodes. Reproductive: The prostate gland is moderately enlarged. Other: A 3.9 cm x 2.3 cm right inguinal hernia is seen. This contains fat and a small amount of fluid. A 3.4 cm x 2.2 cm fat containing left inguinal hernia is also noted.  There is a moderate amount of abdominopelvic free fluid. Musculoskeletal: Multilevel degenerative changes are seen throughout the lumbar spine. IMPRESSION: 1. Acute sigmoid diverticulitis with an adjacent 2.8 cm x 2.2 cm pericolonic abscess. 2. Moderate to marked severity bilateral lower lobe consolidation, increased in severity when compared to the prior study. 3. Bilateral pleural effusions, increased in size when compared to the prior study. 4. Cholelithiasis. 5. Small to moderate-sized hiatal hernia. 6. Bilateral fat containing inguinal hernias. 7. Enlarged prostate gland. 8. Small amount of air within the anterior aspect of the urinary bladder. This may represent sequelae associated with recent Foley catheterization, however, further evaluation with urinalysis is recommended to exclude cystitis. 9. Stable low-attenuation pancreatic lesion, as described above. Given the patient's age (greater than 46 years of age), no additional follow-up or imaging is recommended. This recommendation follows ACR consensus guidelines: Management of incidental Pancreatic Cysts: A White Paper of the ACR Incidental Findings Committee. [place] [id number]. 10. Aortic atherosclerosis. Aortic Atherosclerosis (ICD10-I70.0). Electronically Signed   By: Virgina Norfolk M.D.   On: 10/20/2021 02:18   DG Abd Portable 2V  Result Date: 10/19/2021 CLINICAL DATA:  Abdominal pain. EXAM: PORTABLE ABDOMEN - 2 VIEW COMPARISON:  None Available. FINDINGS: Mild to moderate severity atelectasis and/or infiltrate is seen within the left lung base. The bowel gas pattern is normal. There is no evidence of free air. No radio-opaque calculi are seen. Calcified vessels are noted within the left upper quadrant. IMPRESSION: 1. Mild to moderate severity left basilar atelectasis and/or infiltrate. 2. Normal bowel gas pattern. Electronically Signed   By: Virgina Norfolk M.D.   On: 10/19/2021 23:47   DG Chest Port 1 View  Result Date:  10/19/2021 CLINICAL DATA:  Questionable sepsis. EXAM: PORTABLE CHEST 1 VIEW COMPARISON:  November 15, 2018 FINDINGS: Multiple radiopaque cardiac lead wires are seen overlying the right lung. The heart size and mediastinal contours are within normal limits. There is marked severity calcification of the aortic arch. Mild to moderate severity areas of hazy airspace disease are seen overlying the mid right lung and bilateral lung bases. There is no evidence of a pleural effusion or pneumothorax. Multilevel degenerative changes are seen throughout the thoracic spine. IMPRESSION: Mild to moderate severity airspace disease within mid right lung and bilateral lung bases. Electronically Signed   By: Virgina Norfolk M.D.   On: 10/19/2021 23:26  Scheduled Meds:  heparin  5,000 Units Subcutaneous Q8H   insulin aspart  0-6 Units Subcutaneous Q4H   latanoprost  1 drop Left Eye QHS   sodium chloride flush  3 mL Intravenous Q12H   tamsulosin  0.4 mg Oral PC supper   Continuous Infusions:  lactated ringers 75 mL/hr at 10/21/21 0445   piperacillin-tazobactam (ZOSYN)  IV 2.25 g (10/21/21 0541)     LOS: 1 day    Time spent: 45 minutes spent on chart review, discussion with nursing staff, consultants, updating family and interview/physical exam; more than 50% of that time was spent in counseling and/or coordination of care.    Geradine Girt, DO [facility] Available via Epic secure chat 7am-7pm After these hours, please refer to coverage provider listed on [email or web address] 10/21/2021, 8:43 AM

## 2021-10-22 DIAGNOSIS — E119 Type 2 diabetes mellitus without complications: Secondary | ICD-10-CM | POA: Diagnosis not present

## 2021-10-22 DIAGNOSIS — A419 Sepsis, unspecified organism: Secondary | ICD-10-CM | POA: Diagnosis not present

## 2021-10-22 DIAGNOSIS — R531 Weakness: Secondary | ICD-10-CM | POA: Diagnosis not present

## 2021-10-22 DIAGNOSIS — N179 Acute kidney failure, unspecified: Secondary | ICD-10-CM | POA: Diagnosis not present

## 2021-10-22 DIAGNOSIS — J690 Pneumonitis due to inhalation of food and vomit: Secondary | ICD-10-CM | POA: Diagnosis not present

## 2021-10-22 LAB — CBC
HCT: 29.4 % — ABNORMAL LOW (ref 39.0–52.0)
Hemoglobin: 9.6 g/dL — ABNORMAL LOW (ref 13.0–17.0)
MCH: 31.4 pg (ref 26.0–34.0)
MCHC: 32.7 g/dL (ref 30.0–36.0)
MCV: 96.1 fL (ref 80.0–100.0)
Platelets: 444 K/uL — ABNORMAL HIGH (ref 150–400)
RBC: 3.06 MIL/uL — ABNORMAL LOW (ref 4.22–5.81)
RDW: 15.3 % (ref 11.5–15.5)
WBC: 28 K/uL — ABNORMAL HIGH (ref 4.0–10.5)
nRBC: 0 % (ref 0.0–0.2)

## 2021-10-22 LAB — BASIC METABOLIC PANEL
Anion gap: 10 (ref 5–15)
BUN: 35 mg/dL — ABNORMAL HIGH (ref 8–23)
CO2: 22 mmol/L (ref 22–32)
Calcium: 8.4 mg/dL — ABNORMAL LOW (ref 8.9–10.3)
Chloride: 110 mmol/L (ref 98–111)
Creatinine, Ser: 1.77 mg/dL — ABNORMAL HIGH (ref 0.61–1.24)
GFR, Estimated: 35 mL/min — ABNORMAL LOW (ref >60)
Glucose, Bld: 237 mg/dL — ABNORMAL HIGH (ref 70–99)
Potassium: 4.1 mmol/L (ref 3.5–5.1)
Sodium: 142 mmol/L (ref 135–145)

## 2021-10-22 LAB — GLUCOSE, CAPILLARY
Glucose-Capillary: 221 mg/dL — ABNORMAL HIGH (ref 70–99)
Glucose-Capillary: 217 mg/dL — ABNORMAL HIGH (ref 70–99)
Glucose-Capillary: 187 mg/dL — ABNORMAL HIGH (ref 70–99)
Glucose-Capillary: 264 mg/dL — ABNORMAL HIGH (ref 70–99)
Glucose-Capillary: 207 mg/dL — ABNORMAL HIGH (ref 70–99)

## 2021-10-22 LAB — MAGNESIUM: Magnesium: 1.8 mg/dL (ref 1.7–2.4)

## 2021-10-22 NOTE — Progress Notes (Signed)
PROGRESS NOTE    Riley Griffith  YQI:[id number] DOB: 09/08/1923 DOA: 10/19/2021 PCP: [name], MD    Brief Narrative:  Riley Griffith is a 88 y.o. male with a history of IDT2DM, HTN, stage IIIb CKD, and recent admission (discharged 6/23) for sigmoid diverticulitis with abscess who presented to the ED 6/26 from SNF with encephalopathy, vomiting. In the ED he was febrile in respiratory distress requiring 15L by NRB, hypotensive to 69/50 with WBC 32.4k, reactive thrombocytosis, and lactic acidosis (level 3.0). CT abd/pelvis confirmed acute sigmoid diverticulitis with 2.8 x 2.2 cm abscess, air in bladder as well as bibasilar pulmonary consolidations and effusions. IV antibiotics given for aspiration pneumonia to also cover the abscess, IV fluids given, SLP evaluation requested, and the patient was admitted this morning by Dr. Myna Hidalgo. Respiratory status has stabilized. Family has confirmed the patient's reported desire to be DNR status and not to escalate care from the current level.  Family not quite ready for comfort care yet.    Assessment and Plan: Aspiration pneumonia; acute hypoxic respiratory failure; severe sepsis  - Discharged to SNF on 6/23 after inpatient conservative treatment of diverticulitis with abscess, now returning with feculent vomiting - CT abd/pelvis concerning for bilateral lower lobe consolidations  - He is febrile with increased leukocytosis, acute hypoxic respiratory failure, lactic acidosis, and acute encephalopathy  - Blood cultures were collected in ED, 3.5 liters IVF was given, and he was started on antibiotics  - palliative care consulted- family not ready for comfort care yet - SLP re-eval-- seems to cough with even the liquids (family does say he would not want feeding tube)   Sigmoid diverticulitis with abscess  - WBC had trended down and symptoms improved with treatment during recent admission; CT now demonstrates persistent inflammatory change and abscess though  it appears smaller  - Suspect the primary problem is now aspiration PNA  - Continue Zosyn     AKI superimposed on CKD IIIb - SCr is 2.39 on admission, up from 1.80 on 10/17/21 and now back to baseline  - No hydronephrosis on CT in ED  - Likely acute prerenal injury, possibly ATN, in setting of sepsis, hypotension, N/V with hypovolemia  - He has been fluid-resuscitated in ED  - Avoid hypotension, renally-dose medications, serial chem panels    Hypertension  - Hypotensive in ED  - antihypertensives will be held      Insulin-dependent DM  - SSI     DVT prophylaxis: heparin injection 5,000 Units Start: 10/20/21 0630    Code Status: DNR   Disposition Plan:  Level of care: Progressive Status is: Inpatient Remains inpatient appropriate because: continued SLP eval and IV abx  Called daughter, no answer  Consultants:  Palliative care   Subjective: No voiced complaints, not able to forcefully cough  Objective: Vitals:   10/21/21 1921 10/21/21 2329 10/22/21 0350 10/22/21 0739  BP: 103/64 114/67 124/68 127/71  Pulse: 99 100 94 100  Resp: 20 20 (!) 22 (!) 21  Temp: (!) 97.5 F (36.4 C) 98.6 F (37 C) 98.2 F (36.8 C) 98.6 F (37 C)  TempSrc: Axillary Oral Oral Oral  SpO2:  96% 95% 97%    Intake/Output Summary (Last 24 hours) at 10/22/2021 0842 Last data filed at 10/22/2021 0606 Gross per 24 hour  Intake 480 ml  Output 750 ml  Net -270 ml   There were no vitals filed for this visit.  Examination:   General: Appearance:    Frail/elderly male  in no acute distress     Lungs:     On [place], diminished anteriorly, respirations unlabored  Heart:    Tachycardic.      Neurologic:   Will awaken, will only say a few words, will follow commands        Data Reviewed: I have personally reviewed following labs and imaging studies  CBC: Recent Labs  Lab 10/16/21 0502 10/17/21 0459 10/19/21 2313 10/21/21 0040 10/22/21 0606  WBC 13.0* 14.3* 32.4* 32.8* 28.0*  NEUTROABS   --   --  30.8*  --   --   HGB 10.4* 10.7* 11.7* 8.9* 9.6*  HCT 32.5* 32.7* 34.9* 26.6* 29.4*  MCV 96.2 95.6 95.4 95.7 96.1  PLT 321 389 476* 388 268*   Basic Metabolic Panel: Recent Labs  Lab 10/16/21 0502 10/17/21 0459 10/19/21 2313 10/21/21 0040 10/22/21 0606  NA 141 139 137 140 142  K 3.4* 4.0 4.1 3.7 4.1  CL 115* 113* 104 110 110  CO2 21* 21* 19* 20* 22  GLUCOSE 69* 180* 236* 149* 237*  BUN 38* 37* 40* 38* 35*  CREATININE 1.71* 1.80* 2.39* 1.89* 1.77*  CALCIUM 7.8* 8.1* 8.4* 8.0* 8.4*   GFR: Estimated Creatinine Clearance: 26 mL/min (A) (by C-G formula based on SCr of 1.77 mg/dL (H)). Liver Function Tests: Recent Labs  Lab 10/19/21 2313  AST 29  ALT 22  ALKPHOS 104  BILITOT 0.8  PROT 5.0*  ALBUMIN 2.3*   No results for input(s): "LIPASE", "AMYLASE" in the last 168 hours. No results for input(s): "AMMONIA" in the last 168 hours. Coagulation Profile: Recent Labs  Lab 10/19/21 2313  INR 1.5*   Cardiac Enzymes: No results for input(s): "CKTOTAL", "CKMB", "CKMBINDEX", "TROPONINI" in the last 168 hours. BNP (last 3 results) No results for input(s): "PROBNP" in the last 8760 hours. HbA1C: Recent Labs    10/20/21 0728  HGBA1C 7.6*   CBG: Recent Labs  Lab 10/21/21 1557 10/21/21 1940 10/21/21 2328 10/22/21 0516 10/22/21 0738  GLUCAP 206* 252* 263* 221* 217*   Lipid Profile: No results for input(s): "CHOL", "HDL", "LDLCALC", "TRIG", "CHOLHDL", "LDLDIRECT" in the last 72 hours. Thyroid Function Tests: No results for input(s): "TSH", "T4TOTAL", "FREET4", "T3FREE", "THYROIDAB" in the last 72 hours. Anemia Panel: No results for input(s): "VITAMINB12", "FOLATE", "FERRITIN", "TIBC", "IRON", "RETICCTPCT" in the last 72 hours. Sepsis Labs: Recent Labs  Lab 10/19/21 2313 10/20/21 0135 10/20/21 0728  LATICACIDVEN 3.0* 2.7* 2.0*    Recent Results (from the past 240 hour(s))  Blood Culture (routine x 2)     Status: None (Preliminary result)   Collection  Time: 10/19/21 11:13 PM   Specimen: BLOOD RIGHT FOREARM  Result Value Ref Range Status   Specimen Description BLOOD RIGHT FOREARM  Final   Special Requests   Final    BOTTLES DRAWN AEROBIC AND ANAEROBIC Blood Culture adequate volume   Culture   Final    NO GROWTH 3 DAYS Performed at [facility], [place] [address], [place], [place] [address]    Report Status PENDING  Incomplete  Blood Culture (routine x 2)     Status: None (Preliminary result)   Collection Time: 10/19/21 11:34 PM   Specimen: BLOOD  Result Value Ref Range Status   Specimen Description BLOOD SITE NOT SPECIFIED  Final   Special Requests   Final    BOTTLES DRAWN AEROBIC AND ANAEROBIC Blood Culture adequate volume   Culture   Final    NO GROWTH 3 DAYS Performed  at [facility], [place] [address] [place], [place] [address]    Report Status PENDING  Incomplete  Resp Panel by RT-PCR (Flu A&B, Covid) Anterior Nasal Swab     Status: None   Collection Time: 10/19/21 11:46 PM   Specimen: Anterior Nasal Swab  Result Value Ref Range Status   SARS Coronavirus 2 by RT PCR NEGATIVE NEGATIVE Final    Comment: (NOTE) SARS-CoV-2 target nucleic acids are NOT DETECTED.  The SARS-CoV-2 RNA is generally detectable in upper respiratory specimens during the acute phase of infection. The lowest concentration of SARS-CoV-2 viral copies this assay can detect is 138 copies/mL. A negative result does not preclude SARS-Cov-2 infection and should not be used as the sole basis for treatment or other patient management decisions. A negative result may occur with  improper specimen collection/handling, submission of specimen other than nasopharyngeal swab, presence of viral mutation(s) within the areas targeted by this assay, and inadequate number of viral copies(<138 copies/mL). A negative result must be combined with clinical observations, patient history, and epidemiological information. The expected result is Negative.  Fact Sheet  for Patients:  EntrepreneurPulse.com.au  Fact Sheet for Healthcare Providers:  IncredibleEmployment.be  This test is no t yet approved or cleared by the [place] FDA and  has been authorized for detection and/or diagnosis of SARS-CoV-2 by FDA under an Emergency Use Authorization (EUA). This EUA will remain  in effect (meaning this test can be used) for the duration of the COVID-19 declaration under Section 564(b)(1) of the Act, 21 U.S.C.section 360bbb-3(b)(1), unless the authorization is terminated  or revoked sooner.       Influenza A by PCR NEGATIVE NEGATIVE Final   Influenza B by PCR NEGATIVE NEGATIVE Final    Comment: (NOTE) The Xpert Xpress SARS-CoV-2/FLU/RSV plus assay is intended as an aid in the diagnosis of influenza from Nasopharyngeal swab specimens and should not be used as a sole basis for treatment. Nasal washings and aspirates are unacceptable for Xpert Xpress SARS-CoV-2/FLU/RSV testing.  Fact Sheet for Patients: EntrepreneurPulse.com.au  Fact Sheet for Healthcare Providers: IncredibleEmployment.be  This test is not yet approved or cleared by the [place] FDA and has been authorized for detection and/or diagnosis of SARS-CoV-2 by FDA under an Emergency Use Authorization (EUA). This EUA will remain in effect (meaning this test can be used) for the duration of the COVID-19 declaration under Section 564(b)(1) of the Act, 21 U.S.C. section 360bbb-3(b)(1), unless the authorization is terminated or revoked.  Performed at [facility], [place] [address] [place],  [address]          Radiology Studies: No results found.      Scheduled Meds:  heparin  5,000 Units Subcutaneous Q8H   insulin aspart  0-6 Units Subcutaneous Q4H   latanoprost  1 drop Left Eye QHS   sodium chloride flush  3 mL Intravenous Q12H   tamsulosin  0.4 mg Oral PC supper   Continuous  Infusions:  piperacillin-tazobactam (ZOSYN)  IV 3.375 g (10/22/21 0508)     LOS: 2 days    Time spent: 45 minutes spent on chart review, discussion with nursing staff, consultants, updating family and interview/physical exam; more than 50% of that time was spent in counseling and/or coordination of care.    Geradine Girt, DO [facility] Available via Epic secure chat 7am-7pm After these hours, please refer to coverage provider listed on [email or web address] 10/22/2021, 8:42 AM

## 2021-10-22 NOTE — Evaluation (Signed)
Physical Therapy Evaluation Patient Details Name: Riley Griffith MRN: [id number] DOB: [birth year] Today's Date: 10/22/2021  History of Present Illness  Pt is a 88 y.o. male who presented 10/19/21 from [facility] with recurrent bouts of nausea and vomiting. Pt recently admitted for sigmoid diverticulitis with abscess and discharged 10/16/21. Pt now admitted with aspiration PNA, acute hypoxic respiratory failure, severe sepsis, AKI superimposed on CKD IIIb. PMH: DM, HTN, CKD stage IIIb, R BKA   Clinical Impression  Pt presents with condition above and deficits mentioned below, see PT Problem List. Per chart, pt was most recently discharged to [facility] on 10/16/21 and likely required assistance for all ADLs and transfers. At this time, pt required minA for bed mobility and TA x2 for a successful transfer. Pt displayed minimal if any initiation through his L leg to assist with transfers. Pt demonstrates deficits in strength, activity tolerance, aerobic endurance, and balance. Recommend pt return to SNF for rehab. Will continue to follow acutely.       Recommendations for follow up therapy are one component of a multi-disciplinary discharge planning process, led by the attending physician.  Recommendations may be updated based on patient status, additional functional criteria and insurance authorization.  Follow Up Recommendations Skilled nursing-short term rehab (<3 hours/day) Can patient physically be transported by private vehicle: No    Assistance Recommended at Discharge Frequent or constant Supervision/Assistance  Patient can return home with the following  Help with stairs or ramp for entrance;Assist for transportation;A lot of help with bathing/dressing/bathroom;Assistance with cooking/housework;Two people to help with walking and/or transfers;Direct supervision/assist for medications management;Direct supervision/assist for financial management    Equipment Recommendations  BSC/3in1;Wheelchair (measurements PT);Wheelchair cushion (measurements PT);Hospital bed;Other (comment) (lift equipment)  Recommendations for Other Services  OT consult    Functional Status Assessment Patient has had a recent decline in their functional status and demonstrates the ability to make significant improvements in function in a reasonable and predictable amount of time.     Precautions / Restrictions Precautions Precautions: Fall;Other (comment) Precaution Comments: prior R BKA (no prosthesis present); watch SpO2 Restrictions Weight Bearing Restrictions: No      Mobility  Bed Mobility Overal bed mobility: Needs Assistance Bed Mobility: Supine to Sit     Supine to sit: Min assist, HOB elevated     General bed mobility comments: Extra time and R HHA to pull trunk up to sit L EOB, HOB elevated with use of bed rail. Extra time and assistance using bed pad to scoot hips to edge of bed.    Transfers Overall transfer level: Needs assistance Equipment used: None Transfers: Sit to/from Stand, Bed to chair/wheelchair/BSC Sit to Stand: Total assist     Squat pivot transfers: Total assist, +2 physical assistance, +2 safety/equipment    Lateral/Scoot Transfers: Total assist General transfer comment: Attempted sit > stand with L knee block at EOB and cues for pt to bring his chest to PT's shoulder and hold onto PT to facilitate anterior transition of weight, but min-no initiation noted by pt to stand, TA with no success to clear buttocks. Same for attempts to laterally scoot. NT assisted with TAx2 squat pivot to L bed > recliner using bed pads as sling.    Ambulation/Gait               General Gait Details: unable  Stairs            Wheelchair Mobility    Modified Rankin (Stroke Patients Only)  Balance Overall balance assessment: Needs assistance Sitting-balance support: Feet supported, Single extremity supported, Bilateral upper extremity  supported Sitting balance-Leahy Scale: Poor Sitting balance - Comments: UE support, min guard-minA to sit statically EOB Postural control: Posterior lean   Standing balance-Leahy Scale: Zero Standing balance comment: unable to stand                             Pertinent Vitals/Pain Pain Assessment Pain Assessment: Faces Faces Pain Scale: Hurts little more Pain Location: generalized grimacing with mobility and with difficulty breathing Pain Descriptors / Indicators: Discomfort, Grimacing Pain Intervention(s): Limited activity within patient's tolerance, Monitored during session, Repositioned    Home Living Family/patient expects to be discharged to:: Skilled nursing facility                   Additional Comments: Per chart, most recently discharged to [facility] on 10/16/21    Prior Function Prior Level of Function : Needs assist;Patient poor historian/Family not available;Other (comment) (no family present to report info as pt is poor historian per chart)             Mobility Comments: Likely needed extensive assistance for transfers at the SNF, recently discharged there 10/16/21 ADLs Comments: Likely needed assistance for all ADls at the SNF, recently discharged there 10/16/21     Hand Dominance        Extremity/Trunk Assessment   Upper Extremity Assessment Upper Extremity Assessment: Defer to OT evaluation    Lower Extremity Assessment Lower Extremity Assessment: RLE deficits/detail;LLE deficits/detail RLE Deficits / Details: prior R BKA; gross generalized weakness LLE Deficits / Details: gross generalized weakness    Cervical / Trunk Assessment Cervical / Trunk Assessment: Kyphotic  Communication   Communication: HOH  Cognition Arousal/Alertness: Awake/alert Behavior During Therapy: WFL for tasks assessed/performed Overall Cognitive Status: No family/caregiver present to determine baseline cognitive functioning                                  General Comments: Pt follows commands with increased time, but may be due to being [facility]. Poor initiation and sequencing by pt for transfers.        General Comments General comments (skin integrity, edema, etc.): SpO2 >/= 88% on 3L with mobility, cues for breathing in with nose and out with pursed lips, rebounds well; increased to 4L end of session for pt comfort (>/= 93% on 3L at rest) with SpO2 >/= 96% as pt reported difficulty breathing, but reported this improved it    Exercises     Assessment/Plan    PT Assessment Patient needs continued PT services  PT Problem List Decreased strength;Decreased activity tolerance;Decreased balance;Decreased mobility;Decreased range of motion;Decreased coordination;Decreased cognition;Cardiopulmonary status limiting activity       PT Treatment Interventions DME instruction;Gait training;Functional mobility training;Therapeutic activities;Therapeutic exercise;Balance training;Neuromuscular re-education;Cognitive remediation;Patient/family education;Wheelchair mobility training    PT Goals (Current goals can be found in the Care Plan section)  Acute Rehab PT Goals Patient Stated Goal: to breathe better PT Goal Formulation: With patient Time For Goal Achievement: 11/05/21 Potential to Achieve Goals: Fair    Frequency Min 2X/week     Co-evaluation               AM-PAC PT "6 Clicks" Mobility  Outcome Measure Help needed turning from your back to your side while in a flat bed without using bedrails?:  A Little Help needed moving from lying on your back to sitting on the side of a flat bed without using bedrails?: A Little Help needed moving to and from a bed to a chair (including a wheelchair)?: Total Help needed standing up from a chair using your arms (e.g., wheelchair or bedside chair)?: Total Help needed to walk in hospital room?: Total Help needed climbing 3-5 steps with a railing? : Total 6 Click Score: 10    End  of Session Equipment Utilized During Treatment: Gait belt Activity Tolerance: Patient tolerated treatment well;Patient limited by fatigue Patient left: in chair;with call bell/phone within reach;with chair alarm set Nurse Communication: Mobility status;Need for lift equipment PT Visit Diagnosis: Muscle weakness (generalized) (M62.81);Difficulty in walking, not elsewhere classified (R26.2);Unsteadiness on feet (R26.81)    Time: 3291-9166 PT Time Calculation (min) (ACUTE ONLY): 23 min   Charges:   PT Evaluation $PT Eval Moderate Complexity: 1 Mod PT Treatments $Therapeutic Activity: 8-22 mins        Moishe Spice, PT, DPT Acute Rehabilitation Services  Office: [phone]   Orvan Falconer 10/22/2021, 1:04 PM

## 2021-10-22 NOTE — Progress Notes (Signed)
Speech Language Pathology Treatment: Dysphagia  Patient Details Name: Riley Griffith MRN: [id number] DOB: 11-Jan-1924 Today's Date: 10/22/2021 Time: 7579-7282 SLP Time Calculation (min) (ACUTE ONLY): 18 min  Assessment / Plan / Recommendation Clinical Impression  Provided diagnostic treatment session at bedside given report that pt is coughing with clear liquid diet. SLP offered 2 oz applesauce with no coughing, then sips of thin, nectar and then honey thick liquids. All liquid textures elicited eventual coughing. Gave rest breaks in between sips for prior coughing to stop. After completing trials of liquid textures, offered puree again, which also elicited coughing. Strongly suspect an esophageal etiology for coughing given that severity of coughing seems more related to quantity of intake than texture, but instrumental testing would be required to determine reason for dysphagia. Regardless of cause, given pts situation, this is likely to be a persistent problem that may be difficult to resolve with strategies or modification. Discussed plan of care with son, who reported he does want to proceed with MBS to aid in decision making process. Will f/u with MBS tomorrow.    HPI HPI: Riley Griffith is a pleasant 88 y.o. male admitted with declining health from SNF.  SNF noted recurrent episodes of vomiting feculent material. Son has observed pt to gag with pills and water, maybe about 6 weeks of apparent trouble with digestion. Pt with medical history significant for insulin-dependent diabetes mellitus, hypertension, CKD stage IIIb, and recent admission for sigmoid diverticulitis with abscess.      SLP Plan         Recommendations for follow up therapy are one component of a multi-disciplinary discharge planning process, led by the attending physician.  Recommendations may be updated based on patient status, additional functional criteria and insurance authorization.    Recommendations  Diet  recommendations: Thin liquid Liquids provided via: Cup;Straw                Follow Up Recommendations: Skilled nursing-short term rehab (<3 hours/day)           Prentiss Hammett, Katherene Ponto  10/22/2021, 2:40 PM

## 2021-10-22 NOTE — Progress Notes (Signed)
Patient ID: [name], male   DOB: [birth year], 88 y.o.   MRN: [id number]    Progress Note from the Palliative Medicine Team at [facility]   Patient Name: Riley Griffith        Date: 10/22/2021 DOB: Mar 29, 1924  Age: 88 y.o. MRN#: [id number] Attending Physician: Geradine Girt, DO Primary Care Physician: [name], MD Admit Date: 10/19/2021   Medical records reviewed, discussed with treatment team  This NP assessed patient. Mr Hara is weak, he voices general discomfort. Speech is weak.   Audible throat secretions, unable to clear secretion with weak cough.  I spoke  with patient's son/Larry by phone for continued conversation regarding current medical situation; treatment option decisions, advanced directive decisions and anticipatory care needs.  Of note patient has documentation for desire for natural death.  Education offered on patient's medical condition specific to aspiration pneumonia and over all failure to thrive.  Patient with leukocytosis, poor p.o. intake, weakness, dysphagia and high risk for continued aspiration and decompensation.    Patient and family face decision specific to patient's dysphagia as it relates to artifical feeding verses comfort feeds,  with known risk of aspiration.  Education offered again today on patient's overall failure to thrive and long-term poor prognosis.  Patient's son verbalizes that he wishes to speak with Dr. Eliseo Squires.   Dr. Eliseo Squires to call patient's son in one hour.  Later in the day daughter Vaughan Basta called to discuss her father's medical situation.  She verbalizes that she understands the seriousness of his medical situation, Sandy Salaam that her desire for her father would be to shift to a comfort path allowing for natural death however she will leave decisions to her brother.  Education offered today regarding  the importance of continued conversation with family and their  medical providers regarding overall plan of care and  treatment options,  ensuring decisions are within the context of the patients values and GOCs.  Questions and concerns addressed   Discussed with Dr Eliseo Squires  PMT will continue to support holistically.    This nurse practitioner informed  the family and the attending that I will be out of the hospital until Monday morning.  If the patient is still hospitalized I will follow-up at that time.  Call palliative medicine team phone # [phone] with questions or concerns in the interim  Wadie Lessen NP  Palliative Medicine Team Team Phone # [phone] Pager [phone]

## 2021-10-23 ENCOUNTER — Inpatient Hospital Stay (HOSPITAL_COMMUNITY): Payer: HMO

## 2021-10-23 DIAGNOSIS — N1832 Chronic kidney disease, stage 3b: Secondary | ICD-10-CM | POA: Diagnosis not present

## 2021-10-23 DIAGNOSIS — J690 Pneumonitis due to inhalation of food and vomit: Secondary | ICD-10-CM | POA: Diagnosis not present

## 2021-10-23 DIAGNOSIS — N179 Acute kidney failure, unspecified: Secondary | ICD-10-CM | POA: Diagnosis not present

## 2021-10-23 LAB — GLUCOSE, CAPILLARY
Glucose-Capillary: 108 mg/dL — ABNORMAL HIGH (ref 70–99)
Glucose-Capillary: 171 mg/dL — ABNORMAL HIGH (ref 70–99)
Glucose-Capillary: 203 mg/dL — ABNORMAL HIGH (ref 70–99)
Glucose-Capillary: 209 mg/dL — ABNORMAL HIGH (ref 70–99)
Glucose-Capillary: 212 mg/dL — ABNORMAL HIGH (ref 70–99)
Glucose-Capillary: 230 mg/dL — ABNORMAL HIGH (ref 70–99)

## 2021-10-23 LAB — BASIC METABOLIC PANEL
Anion gap: 6 (ref 5–15)
BUN: 34 mg/dL — ABNORMAL HIGH (ref 8–23)
CO2: 23 mmol/L (ref 22–32)
Calcium: 8.3 mg/dL — ABNORMAL LOW (ref 8.9–10.3)
Chloride: 111 mmol/L (ref 98–111)
Creatinine, Ser: 1.72 mg/dL — ABNORMAL HIGH (ref 0.61–1.24)
GFR, Estimated: 36 mL/min — ABNORMAL LOW (ref >60)
Glucose, Bld: 219 mg/dL — ABNORMAL HIGH (ref 70–99)
Potassium: 4 mmol/L (ref 3.5–5.1)
Sodium: 140 mmol/L (ref 135–145)

## 2021-10-23 LAB — CBC
HCT: 29.2 % — ABNORMAL LOW (ref 39.0–52.0)
Hemoglobin: 9.4 g/dL — ABNORMAL LOW (ref 13.0–17.0)
MCH: 31.2 pg (ref 26.0–34.0)
MCHC: 32.2 g/dL (ref 30.0–36.0)
MCV: 97 fL (ref 80.0–100.0)
Platelets: 460 10*3/uL — ABNORMAL HIGH (ref 150–400)
RBC: 3.01 MIL/uL — ABNORMAL LOW (ref 4.22–5.81)
RDW: 15.5 % (ref 11.5–15.5)
WBC: 22.2 10*3/uL — ABNORMAL HIGH (ref 4.0–10.5)
nRBC: 0 % (ref 0.0–0.2)

## 2021-10-23 MED ORDER — INSULIN ASPART 100 UNIT/ML IJ SOLN
0.0000 [IU] | Freq: Three times a day (TID) | INTRAMUSCULAR | Status: DC
  Administered 2021-10-23: 5 [IU] via SUBCUTANEOUS
  Administered 2021-10-24: 2 [IU] via SUBCUTANEOUS
  Administered 2021-10-24: 3 [IU] via SUBCUTANEOUS
  Administered 2021-10-24: 2 [IU] via SUBCUTANEOUS
  Administered 2021-10-25 (×3): 3 [IU] via SUBCUTANEOUS
  Administered 2021-10-26: 2 [IU] via SUBCUTANEOUS

## 2021-10-23 MED ORDER — PANTOPRAZOLE SODIUM 40 MG IV SOLR
40.0000 mg | Freq: Two times a day (BID) | INTRAVENOUS | Status: DC
  Administered 2021-10-23 – 2021-10-27 (×9): 40 mg via INTRAVENOUS
  Filled 2021-10-23 (×9): qty 10

## 2021-10-23 NOTE — Progress Notes (Signed)
  Transition of Care [facility]) Screening Note   Patient Details  Name: Riley Griffith Date of Birth: 1923/07/22   Transition of Care [facility]) CM/SW Contact:    Dawayne Patricia, RN Phone Number: 10/23/2021, 11:49 AM    Transition of Care Department [facility]) has reviewed patient and note pt from [place], DNR, [facility] team following for [name]. We will continue to monitor patient advancement through interdisciplinary progression rounds. If new patient transition needs arise, please place a TOC consult.

## 2021-10-23 NOTE — Progress Notes (Signed)
Modified Barium Swallow Progress Note  Patient Details  Name: Riley Griffith MRN: [id number] Date of Birth: Aug 10, 1923  Today's Date: 10/23/2021  Modified Barium Swallow completed.  Full report located under Chart Review in the Imaging Section.  Brief recommendations include the following:  Clinical Impression  Pt presents with pharyngoesophageal dysphagia characterized by reduced tongue base retraction, reduced pharyngeal constriction, and reduced anterior laryngeal movement with evidence of esophageal dysfunction. He demonstrated vallecular residue, posterior pharyngeal wall residue, and pyriform sinus residue. Pyriform sinus residue was reduced to a functional level with cued secondary swallows. A liquid wash reduced vallecular residue with solids. Trace penetration (PAS 3) from pyriform sinus residue and subsequent aspiration (PAS 7) was noted with thin liquids. Pt's independent coughing did propell material superiorly, but did not expel it from the larynx and subsequent aspiration of it was noted which resulted in periodic coughing after this incident. An additional instance of aspiration (PAS 7) was noted before the swallow secondary to premature spillage of thin liquids with the barium tablet trial. Residue was noted on the inferior surface of the epiglottis after the swallow with nectar thick liquids. However, this did not result in penetration/aspiration and was cleared with secondary swallows. Esophageal sweep revealed stasis of liquid barium and the 23m barium tablet in the mid to lower thoracic esophagus. Superior movement of liquid was noted within the esophagus during this time and only minimal amounts of liquid passed the level of the barium tablet. The etiology of this esophageal stasis cannot be determined with this study and esophageal assesment (e.g., esophagram) would be necessary for that purpose if this aligns with GOC. From an oropharyngeal standpoint, pt may have up to regular  texture solids. However, considering, pt's esophageal dysphagia, and likely potential for stasis and subsequent regurgitation, SLP recommends that the full liquid diet be continued with observance of swallowing precautions. SLP will continue to follow pt pending decision on GOC.    Swallow Evaluation Recommendations   Recommended Consults: Consider esophageal assessment (If this will assist with GOC)   SLP Diet Recommendations:  (Continue full liquid diet)   Liquid Administration via: Cup;Straw   Medication Administration: Crushed with puree   Supervision: Full supervision/cueing for compensatory strategies   Compensations: Slow rate;Small sips/bites;Multiple dry swallows after each bite/sip   Postural Changes: Seated upright at 90 degrees;Remain semi-upright after after feeds/meals (Comment)       Other Recommendations: Have oral suction available  Stelios Kirby I. PHardin Negus [place] CBlainOffice number [phone] [phone]  SHorton Marshall6/30/2023,11:40 AM

## 2021-10-23 NOTE — Evaluation (Signed)
Occupational Therapy Evaluation Patient Details Name: Riley Griffith MRN: [id number] DOB: [birth year] Today's Date: 10/23/2021   History of Present Illness Pt is a 88 y.o. male who presented 10/19/21 from [facility] with recurrent bouts of nausea and vomiting. Pt recently admitted for sigmoid diverticulitis with abscess and discharged 10/16/21. Pt now admitted with aspiration PNA, acute hypoxic respiratory failure, severe sepsis, AKI superimposed on CKD IIIb. PMH: DM, HTN, CKD stage IIIb, R BKA   Clinical Impression   Per chart review, pt with recent dc to SNF on 10/16/21 for therapy. Pt reporting prior to that, he was independent with ADLs and using RW. Unsure of reliability due to decreased cognition. Currently, pt requires Mod A for UB ADLs, Total A for LB ADLs, and Total A for attempting sit<>stand with sara stedy. Pt presenting with decreased balance, strength, and acitvity tolerance. Despite fatigue, pt motivated and agreeable to participate in therapy. Pt would benefit from further acute OT to facilitate safe dc. Recommend dc to SNF for further OT to optimize safety, independence with ADLs, and return to PLOF.     Recommendations for follow up therapy are one component of a multi-disciplinary discharge planning process, led by the attending physician.  Recommendations may be updated based on patient status, additional functional criteria and insurance authorization.   Follow Up Recommendations  Skilled nursing-short term rehab (<3 hours/day)    Assistance Recommended at Discharge Frequent or constant Supervision/Assistance  Patient can return home with the following A lot of help with walking and/or transfers;A lot of help with bathing/dressing/bathroom;Assistance with cooking/housework;Direct supervision/assist for medications management;Assist for transportation;Help with stairs or ramp for entrance    Functional Status Assessment  Patient has had a recent decline in their functional  status and demonstrates the ability to make significant improvements in function in a reasonable and predictable amount of time.  Equipment Recommendations  None recommended by OT    Recommendations for Other Services PT consult     Precautions / Restrictions Precautions Precautions: Fall;Other (comment) Precaution Comments: prior R BKA (no prosthesis present); watch SpO2 Restrictions Weight Bearing Restrictions: No      Mobility Bed Mobility Overal bed mobility: Needs Assistance Bed Mobility: Supine to Sit, Sit to Supine     Supine to sit: Min assist, HOB elevated Sit to supine: Min assist   General bed mobility comments: Min A to hold therapists hand to pull trunk into upright posture. Min A for brining LLE over EOB in returning to supine    Transfers Overall transfer level: Needs assistance Equipment used: None Transfers: Sit to/from Stand, Bed to chair/wheelchair/BSC Sit to Stand: Total assist           General transfer comment: Attmepting sit<>stand with sara stedy, but pt with decreased strength and requiring Total A +2.      Balance Overall balance assessment: Needs assistance Sitting-balance support: Feet supported, Single extremity supported, Bilateral upper extremity supported Sitting balance-Leahy Scale: Good Sitting balance - Comments: donning prosthetic                                   ADL either performed or assessed with clinical judgement   ADL Overall ADL's : Needs assistance/impaired Eating/Feeding: Bed level;Set up   Grooming: Set up;Bed level   Upper Body Bathing: Moderate assistance;Sitting   Lower Body Bathing: Total assistance;Bed level   Upper Body Dressing : Moderate assistance;Sitting   Lower Body Dressing: Total assistance;Bed  level Lower Body Dressing Details (indicate cue type and reason): Pt donning his residual limbs sock at EOB and attempting to done prothetic - to swollen to don. Assistance to don L shoe              Functional mobility during ADLs: Total assistance;+2 for physical assistance (sit<>stand attempted at EOB) General ADL Comments: Pt performing bed mobility, sitting EOB to don prosthetic (RLE too swollen), and attempting sit<>stand. Demonstrating decreased activity tolerance and strength     Vision Baseline Vision/History: 1 Wears glasses Vision Assessment?: No apparent visual deficits     Perception     Praxis      Pertinent Vitals/Pain Pain Assessment Pain Assessment: Faces Faces Pain Scale: No hurt Pain Intervention(s): Monitored during session     Hand Dominance Right   Extremity/Trunk Assessment Upper Extremity Assessment Upper Extremity Assessment: Generalized weakness   Lower Extremity Assessment Lower Extremity Assessment: Defer to PT evaluation;RLE deficits/detail RLE Deficits / Details: prior R BKA - swollen   Cervical / Trunk Assessment Cervical / Trunk Assessment: Kyphotic   Communication Communication Communication: HOH   Cognition Arousal/Alertness: Awake/alert Behavior During Therapy: WFL for tasks assessed/performed Overall Cognitive Status: No family/caregiver present to determine baseline cognitive functioning                                 General Comments: Pt follows commands with increased time, but may be due to being [facility]. Poor initiation and sequencing by pt for transfers.     General Comments  SpO2 >88% on 3L throughout. HR 100-120s.    Exercises     Shoulder Instructions      Home Living Family/patient expects to be discharged to:: Skilled nursing facility                                 Additional Comments: Per chart review, pt is at [facility]. Per pt report, he lives with his wife.      Prior Functioning/Environment Prior Level of Function : Needs assist             Mobility Comments: Pt reporting he uses a RW for walking. However, when attempting to don prosthetic, pt stating he  hasnt walked in several weeks ADLs Comments: Reporting he is independent with ADLs. However since recent dc to SNF 10/16/21, and feel he likely needs assistance for ADLs.        OT Problem List: Decreased strength;Decreased range of motion;Decreased activity tolerance;Impaired balance (sitting and/or standing)      OT Treatment/Interventions: Self-care/ADL training;Therapeutic exercise;DME and/or AE instruction;Therapeutic activities;Patient/family education;Balance training    OT Goals(Current goals can be found in the care plan section) Acute Rehab OT Goals Patient Stated Goal: Get stronger OT Goal Formulation: With patient Time For Goal Achievement: 11/06/21 Potential to Achieve Goals: Fair ADL Goals Pt Will Perform Grooming: with modified independence;sitting Pt Will Perform Lower Body Bathing: with modified independence;sitting/lateral leans Pt Will Perform Lower Body Dressing: with modified independence;sitting/lateral leans Pt Will Transfer to Toilet: with min assist;with mod assist;stand pivot transfer Pt/caregiver will Perform Home Exercise Program: Increased ROM;Increased strength;Right Upper extremity;Left upper extremity;With Supervision  OT Frequency: Min 2X/week    Co-evaluation              AM-PAC OT "6 Clicks" Daily Activity     Outcome Measure Help from another person eating meals?:  A Little Help from another person taking care of personal grooming?: A Little Help from another person toileting, which includes using toliet, bedpan, or urinal?: Total Help from another person bathing (including washing, rinsing, drying)?: A Lot Help from another person to put on and taking off regular upper body clothing?: A Lot Help from another person to put on and taking off regular lower body clothing?: Total 6 Click Score: 12   End of Session Equipment Utilized During Treatment: Gait belt Nurse Communication: Mobility status;Other (comment) (notified pt was in chair  starting to eat breakfast)  Activity Tolerance: Patient tolerated treatment well Patient left: in chair;with call bell/phone within reach;with chair alarm set  OT Visit Diagnosis: Unsteadiness on feet (R26.81);Other abnormalities of gait and mobility (R26.89);Muscle weakness (generalized) (M62.81)                Time: 4801-6553 OT Time Calculation (min): 39 min Charges:  OT General Charges $OT Visit: 1 Visit OT Evaluation $OT Eval Moderate Complexity: 1 Mod OT Treatments $Self Care/Home Management : 23-37 mins  Cosme Jacob MSOT, OTR/L Acute Rehab Office: [place] 10/23/2021, 2:46 PM

## 2021-10-23 NOTE — Care Management Important Message (Signed)
Important Message  Patient Details  Name: Riley Griffith MRN: [id number] Date of Birth: Apr 16, 1924   Medicare Important Message Given:  Yes     Orbie Pyo 10/23/2021, 4:15 PM

## 2021-10-23 NOTE — Progress Notes (Signed)
PROGRESS NOTE    Riley Griffith  QZE:[id number] DOB: October 08, 1923 DOA: 10/19/2021 PCP: [name], MD    Brief Narrative:  Riley Griffith is a 88 y.o. male with a history of IDT2DM, HTN, stage IIIb CKD, and recent admission (discharged 6/23) for sigmoid diverticulitis with abscess who presented to the ED 6/26 from SNF with encephalopathy, vomiting. In the ED he was febrile in respiratory distress requiring 15L by NRB, hypotensive to 69/50 with WBC 32.4k, reactive thrombocytosis, and lactic acidosis (level 3.0). CT abd/pelvis confirmed acute sigmoid diverticulitis with 2.8 x 2.2 cm abscess, air in bladder as well as bibasilar pulmonary consolidations and effusions. IV antibiotics given for aspiration pneumonia to also cover the abscess, IV fluids given, SLP evaluation requested, and the patient was admitted this morning by Dr. Myna Hidalgo. Respiratory status has stabilized. Family has confirmed the patient's reported desire to be DNR status and not to escalate care from the current level.  Family not quite ready for comfort care yet.    Assessment and Plan: Aspiration pneumonia; acute hypoxic respiratory failure; severe sepsis  - Discharged to SNF on 6/23 after inpatient conservative treatment of diverticulitis with abscess, now returning with feculent vomiting - CT abd/pelvis concerning for bilateral lower lobe consolidations  - He is febrile with increased leukocytosis, acute hypoxic respiratory failure, lactic acidosis, and acute encephalopathy  - Blood cultures were collected in ED, 3.5 liters IVF was given, and he was started on antibiotics  - palliative care consulted- family not ready for comfort care yet - SLP re-eval-- seems to cough with even the liquids (family does say he would not want feeding tube) -unable to get dg esophagus as not able to stand for 5 min and would be concerned about aspiration for him to do flat   Sigmoid diverticulitis with abscess  - WBC had trended down and symptoms  improved with treatment during recent admission; CT now demonstrates persistent inflammatory change and abscess though it appears smaller  - Suspect the primary problem is now aspiration PNA  - Continue Zosyn     AKI superimposed on CKD IIIb - SCr is 2.39 on admission, up from 1.80 on 10/17/21 and now back to baseline  - No hydronephrosis on CT in ED  - Likely acute prerenal injury, possibly ATN, in setting of sepsis, hypotension, N/V with hypovolemia  - He has been fluid-resuscitated in ED  - Avoid hypotension, renally-dose medications, serial chem panels    Hypertension  - Hypotensive in ED  - antihypertensives will be held      Insulin-dependent DM  - SSI     DVT prophylaxis: heparin injection 5,000 Units Start: 10/20/21 0630    Code Status: DNR   Disposition Plan:  Level of care: Med-Surg Status is: Inpatient Remains inpatient appropriate because: continued SLP eval and IV abx  Spoke with daughter In law  Consultants:  Palliative care   Subjective: Not feeling well  Objective: Vitals:   10/22/21 2303 10/23/21 0310 10/23/21 0751 10/23/21 1106  BP: 131/80 130/68 123/69 98/83  Pulse: (!) 103 99 (!) 101 (!) 103  Resp: '20 20 20 20  '$ Temp: 98.2 F (36.8 C) 98.5 F (36.9 C) 98 F (36.7 C) 97.8 F (36.6 C)  TempSrc: Oral Oral Oral Oral  SpO2: 96% 91% 96% 97%    Intake/Output Summary (Last 24 hours) at 10/23/2021 1403 Last data filed at 10/23/2021 0900 Gross per 24 hour  Intake 400 ml  Output 500 ml  Net -100 ml  There were no vitals filed for this visit.  Examination:   General: Appearance:    frail male in no acute distress     Lungs:     Poor effort, respirations unlabored  Heart:    Tachycardic.   MS:   Below knee amputation of right lower extremity is noted.   Neurologic:   Awake, alert        Data Reviewed: I have personally reviewed following labs and imaging studies  CBC: Recent Labs  Lab 10/17/21 0459 10/19/21 2313 10/21/21 0040  10/22/21 0606 10/23/21 0314  WBC 14.3* 32.4* 32.8* 28.0* 22.2*  NEUTROABS  --  30.8*  --   --   --   HGB 10.7* 11.7* 8.9* 9.6* 9.4*  HCT 32.7* 34.9* 26.6* 29.4* 29.2*  MCV 95.6 95.4 95.7 96.1 97.0  PLT 389 476* 388 444* 768*   Basic Metabolic Panel: Recent Labs  Lab 10/17/21 0459 10/19/21 2313 10/21/21 0040 10/22/21 0606 10/23/21 0314  NA 139 137 140 142 140  K 4.0 4.1 3.7 4.1 4.0  CL 113* 104 110 110 111  CO2 21* 19* 20* 22 23  GLUCOSE 180* 236* 149* 237* 219*  BUN 37* 40* 38* 35* 34*  CREATININE 1.80* 2.39* 1.89* 1.77* 1.72*  CALCIUM 8.1* 8.4* 8.0* 8.4* 8.3*  MG  --   --   --  1.8  --    GFR: Estimated Creatinine Clearance: 26.8 mL/min (A) (by C-G formula based on SCr of 1.72 mg/dL (H)). Liver Function Tests: Recent Labs  Lab 10/19/21 2313  AST 29  ALT 22  ALKPHOS 104  BILITOT 0.8  PROT 5.0*  ALBUMIN 2.3*   No results for input(s): "LIPASE", "AMYLASE" in the last 168 hours. No results for input(s): "AMMONIA" in the last 168 hours. Coagulation Profile: Recent Labs  Lab 10/19/21 2313  INR 1.5*   Cardiac Enzymes: No results for input(s): "CKTOTAL", "CKMB", "CKMBINDEX", "TROPONINI" in the last 168 hours. BNP (last 3 results) No results for input(s): "PROBNP" in the last 8760 hours. HbA1C: No results for input(s): "HGBA1C" in the last 72 hours.  CBG: Recent Labs  Lab 10/22/21 1921 10/23/21 0048 10/23/21 0323 10/23/21 0750 10/23/21 1105  GLUCAP 207* 203* 230* 171* 209*   Lipid Profile: No results for input(s): "CHOL", "HDL", "LDLCALC", "TRIG", "CHOLHDL", "LDLDIRECT" in the last 72 hours. Thyroid Function Tests: No results for input(s): "TSH", "T4TOTAL", "FREET4", "T3FREE", "THYROIDAB" in the last 72 hours. Anemia Panel: No results for input(s): "VITAMINB12", "FOLATE", "FERRITIN", "TIBC", "IRON", "RETICCTPCT" in the last 72 hours. Sepsis Labs: Recent Labs  Lab 10/19/21 2313 10/20/21 0135 10/20/21 0728  LATICACIDVEN 3.0* 2.7* 2.0*    Recent  Results (from the past 240 hour(s))  Blood Culture (routine x 2)     Status: None (Preliminary result)   Collection Time: 10/19/21 11:13 PM   Specimen: BLOOD RIGHT FOREARM  Result Value Ref Range Status   Specimen Description BLOOD RIGHT FOREARM  Final   Special Requests   Final    BOTTLES DRAWN AEROBIC AND ANAEROBIC Blood Culture adequate volume   Culture   Final    NO GROWTH 4 DAYS Performed at [facility], [place] [address] [place], [place] [address]    Report Status PENDING  Incomplete  Blood Culture (routine x 2)     Status: None (Preliminary result)   Collection Time: 10/19/21 11:34 PM   Specimen: BLOOD  Result Value Ref Range Status   Specimen Description BLOOD SITE NOT SPECIFIED  Final  Special Requests   Final    BOTTLES DRAWN AEROBIC AND ANAEROBIC Blood Culture adequate volume   Culture   Final    NO GROWTH 4 DAYS Performed at [facility], [place] [address], [place], [place] [address]    Report Status PENDING  Incomplete  Resp Panel by RT-PCR (Flu A&B, Covid) Anterior Nasal Swab     Status: None   Collection Time: 10/19/21 11:46 PM   Specimen: Anterior Nasal Swab  Result Value Ref Range Status   SARS Coronavirus 2 by RT PCR NEGATIVE NEGATIVE Final    Comment: (NOTE) SARS-CoV-2 target nucleic acids are NOT DETECTED.  The SARS-CoV-2 RNA is generally detectable in upper respiratory specimens during the acute phase of infection. The lowest concentration of SARS-CoV-2 viral copies this assay can detect is 138 copies/mL. A negative result does not preclude SARS-Cov-2 infection and should not be used as the sole basis for treatment or other patient management decisions. A negative result may occur with  improper specimen collection/handling, submission of specimen other than nasopharyngeal swab, presence of viral mutation(s) within the areas targeted by this assay, and inadequate number of viral copies(<138 copies/mL). A negative result must be combined  with clinical observations, patient history, and epidemiological information. The expected result is Negative.  Fact Sheet for Patients:  EntrepreneurPulse.com.au  Fact Sheet for Healthcare Providers:  IncredibleEmployment.be  This test is no t yet approved or cleared by the [place] FDA and  has been authorized for detection and/or diagnosis of SARS-CoV-2 by FDA under an Emergency Use Authorization (EUA). This EUA will remain  in effect (meaning this test can be used) for the duration of the COVID-19 declaration under Section 564(b)(1) of the Act, 21 U.S.C.section 360bbb-3(b)(1), unless the authorization is terminated  or revoked sooner.       Influenza A by PCR NEGATIVE NEGATIVE Final   Influenza B by PCR NEGATIVE NEGATIVE Final    Comment: (NOTE) The Xpert Xpress SARS-CoV-2/FLU/RSV plus assay is intended as an aid in the diagnosis of influenza from Nasopharyngeal swab specimens and should not be used as a sole basis for treatment. Nasal washings and aspirates are unacceptable for Xpert Xpress SARS-CoV-2/FLU/RSV testing.  Fact Sheet for Patients: EntrepreneurPulse.com.au  Fact Sheet for Healthcare Providers: IncredibleEmployment.be  This test is not yet approved or cleared by the [place] FDA and has been authorized for detection and/or diagnosis of SARS-CoV-2 by FDA under an Emergency Use Authorization (EUA). This EUA will remain in effect (meaning this test can be used) for the duration of the COVID-19 declaration under Section 564(b)(1) of the Act, 21 U.S.C. section 360bbb-3(b)(1), unless the authorization is terminated or revoked.  Performed at [facility], [place] [address], [place],  [address]          Radiology Studies: DG Swallowing Func-Speech Pathology  Result Date: 10/23/2021 Table formatting from the original result was not included. Images from the original  result were not included. Objective Swallowing Evaluation: Type of Study: MBS-Modified Barium Swallow Study  Patient Details Name: ERHARDT DADA MRN: [id number] Date of Birth: 05-06-1923 Today's Date: 10/23/2021 Time: SLP Start Time (ACUTE ONLY): 0940 -SLP Stop Time (ACUTE ONLY): 0957 SLP Time Calculation (min) (ACUTE ONLY): 17 min Past Medical History: Past Medical History: Diagnosis Date  Diabetes mellitus   GERD (gastroesophageal reflux disease)   Hypertension   Osteomyelitis of foot (HCC)   Renal disorder   see Dr Tawni Carnes Past Surgical History: Past Surgical History: Procedure Laterality Date  AMPUTATION  01/25/2012  Procedure: AMPUTATION BELOW KNEE;  Surgeon: Rozanna Box, MD;  Location: [facility];  Service: Orthopedics;  Laterality: Right;  REVISION OF BELOW KNEE  AMPUTATION   APPLICATION OF WOUND VAC  22/05/9796  Procedure: APPLICATION OF WOUND VAC;  Surgeon: Rozanna Box, MD;  Location: [facility];  Service: Orthopedics;  Laterality: N/A;  AND PLACEMENT OF WOUND VAC   LEG AMPUTATION BELOW KNEE    rt knee HPI: ANGELA PLATNER is a pleasant 88 y.o. male admitted with declining health from SNF.  SNF noted recurrent episodes of vomiting feculent material. Son has observed pt to gag with pills and water, maybe about 6 weeks of apparent trouble with digestion. Pt with medical history significant for insulin-dependent diabetes mellitus, hypertension, CKD stage IIIb, and recent admission for sigmoid diverticulitis with abscess.  No data recorded  Recommendations for follow up therapy are one component of a multi-disciplinary discharge planning process, led by the attending physician.  Recommendations may be updated based on patient status, additional functional criteria and insurance authorization. Assessment / Plan / Recommendation   10/23/2021  10:38 AM Clinical Impressions Clinical Impression Pt presents with pharyngoesophageal dysphagia characterized by reduced tongue base retraction, reduced pharyngeal constriction,  and reduced anterior laryngeal movement with evidence of esophageal dysfunction. He demonstrated vallecular residue, posterior pharyngeal wall residue, and pyriform sinus residue. Pyriform sinus residue was reduced to a functional level with cued secondary swallows. A liquid wash reduced vallecular residue with solids. Trace penetration (PAS 3) from pyriform sinus residue and subsequent aspiration (PAS 7) was noted with thin liquids. Pt's independent coughing did propell material superiorly, but did not expel it from the larynx and subsequent aspiration of it was noted which resulted in periodic coughing after this incident. An additional instance of aspiration (PAS 7) was noted before the swallow secondary to premature spillage of thin liquids with the barium tablet trial. Residue was noted on the inferior surface of the epiglottis after the swallow with nectar thick liquids. However, this did not result in penetration/aspiration and was cleared with secondary swallows. Esophageal sweep revealed stasis of liquid barium and the 36m barium tablet in the mid to lower thoracic esophagus. Superior movement of liquid was noted within the esophagus during this time and only minimal amounts of liquid passed the level of the barium tablet. The etiology of this esophageal stasis cannot be determined with this study and esophageal assesment (e.g., esophagram) would be necessary for that purpose if this aligns with GOC. From an oropharyngeal standpoint, pt may have up to regular texture solids. However, considering, pt's esophageal dysphagia, and likely potential for stasis and subsequent regurgitation, SLP recommends that the full liquid diet be continued with observance of swallowing precautions. SLP will continue to follow pt pending decision on GOC. SLP Visit Diagnosis Dysphagia, pharyngoesophageal phase (R13.14) Impact on safety and function Moderate aspiration risk     10/23/2021  10:38 AM Treatment Recommendations  Treatment Recommendations Therapy as outlined in treatment plan below     10/23/2021  10:38 AM Prognosis Prognosis for Safe Diet Advancement Good Barriers to Reach Goals Severity of deficits   10/23/2021  10:38 AM Diet Recommendations SLP Diet Recommendations -- Liquid Administration via Cup;Straw Medication Administration Crushed with puree Compensations Slow rate;Small sips/bites;Multiple dry swallows after each bite/sip Postural Changes Seated upright at 90 degrees;Remain semi-upright after after feeds/meals (Comment)     10/23/2021  10:38 AM Other Recommendations Recommended Consults Consider esophageal assessment Other Recommendations Have oral suction available Follow Up  Recommendations Skilled nursing-short term rehab (<3 hours/day) Assistance recommended at discharge Frequent or constant Supervision/Assistance Functional Status Assessment Patient has had a recent decline in their functional status and demonstrates the ability to make significant improvements in function in a reasonable and predictable amount of time.   10/23/2021  10:38 AM Frequency and Duration  Speech Therapy Frequency (ACUTE ONLY) min 2x/week Treatment Duration 2 weeks     10/23/2021  10:38 AM Oral Phase Oral Phase Impaired Oral - Thin Cup Premature spillage;Decreased bolus cohesion Oral - Thin Straw Premature spillage;Decreased bolus cohesion Oral - Puree WFL Oral - Regular WFL Oral - Pill Premature spillage;Decreased bolus cohesion    10/23/2021  10:38 AM Pharyngeal Phase Pharyngeal Phase Impaired Pharyngeal- Thin Cup Pharyngeal residue - valleculae;Pharyngeal residue - pyriform;Pharyngeal residue - posterior pharnyx;Reduced tongue base retraction;Reduced anterior laryngeal mobility;Penetration/Apiration after swallow;Trace aspiration Pharyngeal Material enters airway, remains ABOVE vocal cords and not ejected out;Material enters airway, passes BELOW cords and not ejected out despite cough attempt by patient Pharyngeal- Thin Straw Pharyngeal  residue - valleculae;Pharyngeal residue - pyriform;Pharyngeal residue - posterior pharnyx;Reduced tongue base retraction;Reduced anterior laryngeal mobility Pharyngeal- Puree Pharyngeal residue - valleculae;Pharyngeal residue - pyriform;Pharyngeal residue - posterior pharnyx;Reduced tongue base retraction;Reduced anterior laryngeal mobility Pharyngeal- Regular Pharyngeal residue - valleculae;Pharyngeal residue - pyriform;Pharyngeal residue - posterior pharnyx;Reduced tongue base retraction;Reduced anterior laryngeal mobility Pharyngeal- Pill Pharyngeal residue - valleculae;Pharyngeal residue - pyriform;Pharyngeal residue - posterior pharnyx;Reduced tongue base retraction;Reduced anterior laryngeal mobility     No data to display    Shanika I. Hardin Negus, [place], [place] Office number [phone] Pager [phone] Horton Marshall 10/23/2021, 11:53 AM                          Scheduled Meds:  heparin  5,000 Units Subcutaneous Q8H   insulin aspart  0-6 Units Subcutaneous Q4H   latanoprost  1 drop Left Eye QHS   pantoprazole (PROTONIX) IV  40 mg Intravenous Q12H   sodium chloride flush  3 mL Intravenous Q12H   tamsulosin  0.4 mg Oral PC supper   Continuous Infusions:  piperacillin-tazobactam (ZOSYN)  IV 3.375 g (10/23/21 0516)     LOS: 3 days    Time spent: 45 minutes spent on chart review, discussion with nursing staff, consultants, updating family and interview/physical exam; more than 50% of that time was spent in counseling and/or coordination of care.    Geradine Girt, DO [facility] Available via Epic secure chat 7am-7pm After these hours, please refer to coverage provider listed on [email or web address] 10/23/2021, 2:03 PM

## 2021-10-23 NOTE — Progress Notes (Signed)
[facility]) Hospital Liaison note:  This is a pending outpatient-based Palliative Care patient. Will continue to follow for disposition.  Please call with any outpatient palliative questions or concerns.  Thank you, Lorelee Market, LPN [facility] Liaison [phone]

## 2021-10-24 DIAGNOSIS — N179 Acute kidney failure, unspecified: Secondary | ICD-10-CM | POA: Diagnosis not present

## 2021-10-24 DIAGNOSIS — J690 Pneumonitis due to inhalation of food and vomit: Secondary | ICD-10-CM | POA: Diagnosis not present

## 2021-10-24 DIAGNOSIS — N1832 Chronic kidney disease, stage 3b: Secondary | ICD-10-CM | POA: Diagnosis not present

## 2021-10-24 LAB — GLUCOSE, CAPILLARY
Glucose-Capillary: 145 mg/dL — ABNORMAL HIGH (ref 70–99)
Glucose-Capillary: 165 mg/dL — ABNORMAL HIGH (ref 70–99)
Glucose-Capillary: 128 mg/dL — ABNORMAL HIGH (ref 70–99)

## 2021-10-24 LAB — CULTURE, BLOOD (ROUTINE X 2)
Culture: NO GROWTH
Culture: NO GROWTH
Special Requests: ADEQUATE
Special Requests: ADEQUATE

## 2021-10-24 NOTE — Progress Notes (Signed)
Pharmacy Antibiotic Note  Riley Griffith is a 88 y.o. male admitted on 10/19/2021 with aspiration pneumonia and diverticular abscess.  Pharmacy has been consulted for Zosyn dosing.  Today is day #5 of therapy.  Renal function stable, afebrile, WBC downtrending.  Plan: Continue Zosyn EID 3.375g IV Q8H  Monitor renal fxn, micro data, GoC, abx LOT  Temp (24hrs), Avg:98.1 F (36.7 C), Min:97.5 F (36.4 C), Max:98.4 F (36.9 C)  Recent Labs  Lab 10/19/21 2313 10/20/21 0135 10/20/21 0728 10/21/21 0040 10/22/21 0606 10/23/21 0314  WBC 32.4*  --   --  32.8* 28.0* 22.2*  CREATININE 2.39*  --   --  1.89* 1.77* 1.72*  LATICACIDVEN 3.0* 2.7* 2.0*  --   --   --      Estimated Creatinine Clearance: 26.8 mL/min (A) (by C-G formula based on SCr of 1.72 mg/dL (H)).    No Known Allergies  Cefepime/Flagyl 6/26 Zosyn 6/27>>   6/26 BCx - NGTD 6/30 TA - GPC and yeast on Gram stain  Adyn Serna D. Mina Marble, PharmD, BCPS, [place] 10/24/2021, 9:21 AM

## 2021-10-24 NOTE — Progress Notes (Signed)
PROGRESS NOTE    Riley Griffith  CBS:[id number] DOB: 12/20/1923 DOA: 10/19/2021 PCP: [name], MD    Brief Narrative:  Riley Griffith is a 88 y.o. male with a history of IDT2DM, HTN, stage IIIb CKD, and recent admission (discharged 6/23) for sigmoid diverticulitis with abscess who presented to the ED 6/26 from SNF with encephalopathy, vomiting. In the ED he was febrile in respiratory distress requiring 15L by NRB, hypotensive to 69/50 with WBC 32.4k, reactive thrombocytosis, and lactic acidosis (level 3.0). CT abd/pelvis confirmed acute sigmoid diverticulitis with 2.8 x 2.2 cm abscess, air in bladder as well as bibasilar pulmonary consolidations and effusions. IV antibiotics given for aspiration pneumonia to also cover the abscess, IV fluids given, SLP evaluation requested, and the patient was admitted this morning by Dr. Myna Hidalgo. Respiratory status has stabilized. Family has confirmed the patient's reported desire to be DNR status and not to escalate care from the current level.  Family not quite ready for comfort care yet but plan it re -revisit on Monday.    Assessment and Plan: Aspiration pneumonia; acute hypoxic respiratory failure; severe sepsis  - Discharged to SNF on 6/23 after inpatient conservative treatment of diverticulitis with abscess, now returning with feculent vomiting - CT abd/pelvis concerning for bilateral lower lobe consolidations  - He is febrile with increased leukocytosis, acute hypoxic respiratory failure, lactic acidosis, and acute encephalopathy  - Blood cultures were collected in ED, 3.5 liters IVF was given, and he was started on antibiotics  - palliative care consulted- family not ready for comfort care yet - SLP re-eval-- seems to cough with even the liquids (family does say he would not want feeding tube) -unable to get dg esophagus as not able to stand for 5 min and would be concerned about aspiration for him to do flat   Sigmoid diverticulitis with abscess  -  WBC had trended down and symptoms improved with treatment during recent admission; CT now demonstrates persistent inflammatory change and abscess though it appears smaller  - Suspect the primary problem is now aspiration PNA  - Continue Zosyn     AKI superimposed on CKD IIIb - SCr is 2.39 on admission, up from 1.80 on 10/17/21 and now back to baseline  - No hydronephrosis on CT in ED  - Likely acute prerenal injury, possibly ATN, in setting of sepsis, hypotension, N/V with hypovolemia  - He has been fluid-resuscitated in ED  - Avoid hypotension, renally-dose medications, serial chem panels    Hypertension  - Hypotensive in ED  - antihypertensives will be held      Insulin-dependent DM  - SSI     DVT prophylaxis: heparin injection 5,000 Units Start: 10/20/21 0630    Code Status: DNR   Disposition Plan:  Level of care: Med-Surg Status is: Inpatient Remains inpatient appropriate because: continued [place] with daughter at bedside and son on phone  Consultants:  Palliative care   Subjective: tired  Objective: Vitals:   10/23/21 2303 10/24/21 0323 10/24/21 0724 10/24/21 1213  BP: 110/64 122/69 (!) 125/95 (P) 132/83  Pulse: 97 98 (!) 105 (P) 99  Resp: '20 20 20 '$ (P) 20  Temp: 98.4 F (36.9 C) (!) 97.5 F (36.4 C) 98.3 F (36.8 C) (P) 98.1 F (36.7 C)  TempSrc: Oral Oral Oral (P) Oral  SpO2: 95% 98% 99% (P) 97%    Intake/Output Summary (Last 24 hours) at 10/24/2021 1246 Last data filed at 10/24/2021 0400 Gross per 24 hour  Intake  530 ml  Output --  Net 530 ml   There were no vitals filed for this visit.  Examination:   General: Appearance:    Elderly/frail male in no acute distress     Lungs:     Clear to auscultation bilaterally, respirations unlabored  Heart:    Tachycardic. Normal rhythm. No murmurs, rubs, or gallops.      Neurologic:   Awake, alert, weak appearing      Data Reviewed: I have personally reviewed following labs and imaging  studies  CBC: Recent Labs  Lab 10/19/21 2313 10/21/21 0040 10/22/21 0606 10/23/21 0314  WBC 32.4* 32.8* 28.0* 22.2*  NEUTROABS 30.8*  --   --   --   HGB 11.7* 8.9* 9.6* 9.4*  HCT 34.9* 26.6* 29.4* 29.2*  MCV 95.4 95.7 96.1 97.0  PLT 476* 388 444* 287*   Basic Metabolic Panel: Recent Labs  Lab 10/19/21 2313 10/21/21 0040 10/22/21 0606 10/23/21 0314  NA 137 140 142 140  K 4.1 3.7 4.1 4.0  CL 104 110 110 111  CO2 19* 20* 22 23  GLUCOSE 236* 149* 237* 219*  BUN 40* 38* 35* 34*  CREATININE 2.39* 1.89* 1.77* 1.72*  CALCIUM 8.4* 8.0* 8.4* 8.3*  MG  --   --  1.8  --    GFR: Estimated Creatinine Clearance: 26.8 mL/min (A) (by C-G formula based on SCr of 1.72 mg/dL (H)). Liver Function Tests: Recent Labs  Lab 10/19/21 2313  AST 29  ALT 22  ALKPHOS 104  BILITOT 0.8  PROT 5.0*  ALBUMIN 2.3*   No results for input(s): "LIPASE", "AMYLASE" in the last 168 hours. No results for input(s): "AMMONIA" in the last 168 hours. Coagulation Profile: Recent Labs  Lab 10/19/21 2313  INR 1.5*   Cardiac Enzymes: No results for input(s): "CKTOTAL", "CKMB", "CKMBINDEX", "TROPONINI" in the last 168 hours. BNP (last 3 results) No results for input(s): "PROBNP" in the last 8760 hours. HbA1C: No results for input(s): "HGBA1C" in the last 72 hours.  CBG: Recent Labs  Lab 10/23/21 1105 10/23/21 1840 10/23/21 2107 10/24/21 0748 10/24/21 1150  GLUCAP 209* 212* 108* 145* 165*   Lipid Profile: No results for input(s): "CHOL", "HDL", "LDLCALC", "TRIG", "CHOLHDL", "LDLDIRECT" in the last 72 hours. Thyroid Function Tests: No results for input(s): "TSH", "T4TOTAL", "FREET4", "T3FREE", "THYROIDAB" in the last 72 hours. Anemia Panel: No results for input(s): "VITAMINB12", "FOLATE", "FERRITIN", "TIBC", "IRON", "RETICCTPCT" in the last 72 hours. Sepsis Labs: Recent Labs  Lab 10/19/21 2313 10/20/21 0135 10/20/21 0728  LATICACIDVEN 3.0* 2.7* 2.0*    Recent Results (from the past  240 hour(s))  Blood Culture (routine x 2)     Status: None   Collection Time: 10/19/21 11:13 PM   Specimen: BLOOD RIGHT FOREARM  Result Value Ref Range Status   Specimen Description BLOOD RIGHT FOREARM  Final   Special Requests   Final    BOTTLES DRAWN AEROBIC AND ANAEROBIC Blood Culture adequate volume   Culture   Final    NO GROWTH 5 DAYS Performed at [facility], [address], [place], [place] [address]    Report Status 10/24/2021 FINAL  Final  Blood Culture (routine x 2)     Status: None   Collection Time: 10/19/21 11:34 PM   Specimen: BLOOD  Result Value Ref Range Status   Specimen Description BLOOD SITE NOT SPECIFIED  Final   Special Requests   Final    BOTTLES DRAWN AEROBIC AND ANAEROBIC Blood Culture  adequate volume   Culture   Final    NO GROWTH 5 DAYS Performed at [facility], [place] [address], [place], [place] [address]    Report Status 10/24/2021 FINAL  Final  Resp Panel by RT-PCR (Flu A&B, Covid) Anterior Nasal Swab     Status: None   Collection Time: 10/19/21 11:46 PM   Specimen: Anterior Nasal Swab  Result Value Ref Range Status   SARS Coronavirus 2 by RT PCR NEGATIVE NEGATIVE Final    Comment: (NOTE) SARS-CoV-2 target nucleic acids are NOT DETECTED.  The SARS-CoV-2 RNA is generally detectable in upper respiratory specimens during the acute phase of infection. The lowest concentration of SARS-CoV-2 viral copies this assay can detect is 138 copies/mL. A negative result does not preclude SARS-Cov-2 infection and should not be used as the sole basis for treatment or other patient management decisions. A negative result may occur with  improper specimen collection/handling, submission of specimen other than nasopharyngeal swab, presence of viral mutation(s) within the areas targeted by this assay, and inadequate number of viral copies(<138 copies/mL). A negative result must be combined with clinical observations, patient history, and  epidemiological information. The expected result is Negative.  Fact Sheet for Patients:  EntrepreneurPulse.com.au  Fact Sheet for Healthcare Providers:  IncredibleEmployment.be  This test is no t yet approved or cleared by the [place] FDA and  has been authorized for detection and/or diagnosis of SARS-CoV-2 by FDA under an Emergency Use Authorization (EUA). This EUA will remain  in effect (meaning this test can be used) for the duration of the COVID-19 declaration under Section 564(b)(1) of the Act, 21 U.S.C.section 360bbb-3(b)(1), unless the authorization is terminated  or revoked sooner.       Influenza A by PCR NEGATIVE NEGATIVE Final   Influenza B by PCR NEGATIVE NEGATIVE Final    Comment: (NOTE) The Xpert Xpress SARS-CoV-2/FLU/RSV plus assay is intended as an aid in the diagnosis of influenza from Nasopharyngeal swab specimens and should not be used as a sole basis for treatment. Nasal washings and aspirates are unacceptable for Xpert Xpress SARS-CoV-2/FLU/RSV testing.  Fact Sheet for Patients: EntrepreneurPulse.com.au  Fact Sheet for Healthcare Providers: IncredibleEmployment.be  This test is not yet approved or cleared by the [place] FDA and has been authorized for detection and/or diagnosis of SARS-CoV-2 by FDA under an Emergency Use Authorization (EUA). This EUA will remain in effect (meaning this test can be used) for the duration of the COVID-19 declaration under Section 564(b)(1) of the Act, 21 U.S.C. section 360bbb-3(b)(1), unless the authorization is terminated or revoked.  Performed at [facility], [place] [address], [place], [place] [address]   Expectorated Sputum Assessment w Gram Stain, Rflx to Resp Cult     Status: None (Preliminary result)   Collection Time: 10/23/21  4:15 PM   Specimen: Sputum  Result Value Ref Range Status   Specimen Description SPU  Final    Special Requests NONE  Final   Sputum evaluation   Final    THIS SPECIMEN IS ACCEPTABLE FOR SPUTUM CULTURE RARE GRAM POSITIVE COCCI IN CLUSTERS Performed at [facility], [place] [address] [place],  [address]    Report Status PENDING  Incomplete  Culture, Respiratory w Gram Stain     Status: None (Preliminary result)   Collection Time: 10/23/21  4:15 PM   Specimen: Sputum  Result Value Ref Range Status   Specimen Description SPU  Final   Special Requests NONE Reflexed from  [id number]  Final   Gram Stain   Final    NO WBC SEEN RARE GRAM POSITIVE COCCI IN CLUSTERS RARE YEAST WITH PSEUDOHYPHAE    Culture   Final    TOO YOUNG TO READ Performed at [facility], [place] [address] [place], [place] [address]    Report Status PENDING  Incomplete         Radiology Studies: DG Swallowing Func-Speech Pathology  Result Date: 10/23/2021 Table formatting from the original result was not included. Images from the original result were not included. Objective Swallowing Evaluation: Type of Study: MBS-Modified Barium Swallow Study  Patient Details Name: CONAL SHETLEY MRN: [id number] Date of Birth: May 20, 1923 Today's Date: 10/23/2021 Time: SLP Start Time (ACUTE ONLY): 0940 -SLP Stop Time (ACUTE ONLY): 0957 SLP Time Calculation (min) (ACUTE ONLY): 17 min Past Medical History: Past Medical History: Diagnosis Date  Diabetes mellitus   GERD (gastroesophageal reflux disease)   Hypertension   Osteomyelitis of foot ([place])   Renal disorder   see Dr Tawni Carnes Past Surgical History: Past Surgical History: Procedure Laterality Date  AMPUTATION  01/25/2012  Procedure: AMPUTATION BELOW KNEE;  Surgeon: Rozanna Box, MD;  Location: [facility];  Service: Orthopedics;  Laterality: Right;  REVISION OF BELOW KNEE  AMPUTATION   APPLICATION OF WOUND VAC  73/10/1060  Procedure: APPLICATION OF WOUND VAC;  Surgeon: Rozanna Box, MD;  Location: [facility];  Service: Orthopedics;  Laterality: N/A;  AND PLACEMENT OF WOUND VAC    LEG AMPUTATION BELOW KNEE    rt knee HPI: MINOR IDEN is a pleasant 88 y.o. male admitted with declining health from SNF.  SNF noted recurrent episodes of vomiting feculent material. Son has observed pt to gag with pills and water, maybe about 6 weeks of apparent trouble with digestion. Pt with medical history significant for insulin-dependent diabetes mellitus, hypertension, CKD stage IIIb, and recent admission for sigmoid diverticulitis with abscess.  No data recorded  Recommendations for follow up therapy are one component of a multi-disciplinary discharge planning process, led by the attending physician.  Recommendations may be updated based on patient status, additional functional criteria and insurance authorization. Assessment / Plan / Recommendation   10/23/2021  10:38 AM Clinical Impressions Clinical Impression Pt presents with pharyngoesophageal dysphagia characterized by reduced tongue base retraction, reduced pharyngeal constriction, and reduced anterior laryngeal movement with evidence of esophageal dysfunction. He demonstrated vallecular residue, posterior pharyngeal wall residue, and pyriform sinus residue. Pyriform sinus residue was reduced to a functional level with cued secondary swallows. A liquid wash reduced vallecular residue with solids. Trace penetration (PAS 3) from pyriform sinus residue and subsequent aspiration (PAS 7) was noted with thin liquids. Pt's independent coughing did propell material superiorly, but did not expel it from the larynx and subsequent aspiration of it was noted which resulted in periodic coughing after this incident. An additional instance of aspiration (PAS 7) was noted before the swallow secondary to premature spillage of thin liquids with the barium tablet trial. Residue was noted on the inferior surface of the epiglottis after the swallow with nectar thick liquids. However, this did not result in penetration/aspiration and was cleared with secondary swallows.  Esophageal sweep revealed stasis of liquid barium and the 14m barium tablet in the mid to lower thoracic esophagus. Superior movement of liquid was noted within the esophagus during this time and only minimal amounts of liquid passed the level of the barium tablet. The etiology of this esophageal stasis cannot be determined with  this study and esophageal assesment (e.g., esophagram) would be necessary for that purpose if this aligns with GOC. From an oropharyngeal standpoint, pt may have up to regular texture solids. However, considering, pt's esophageal dysphagia, and likely potential for stasis and subsequent regurgitation, SLP recommends that the full liquid diet be continued with observance of swallowing precautions. SLP will continue to follow pt pending decision on GOC. SLP Visit Diagnosis Dysphagia, pharyngoesophageal phase (R13.14) Impact on safety and function Moderate aspiration risk     10/23/2021  10:38 AM Treatment Recommendations Treatment Recommendations Therapy as outlined in treatment plan below     10/23/2021  10:38 AM Prognosis Prognosis for Safe Diet Advancement Good Barriers to Reach Goals Severity of deficits   10/23/2021  10:38 AM Diet Recommendations SLP Diet Recommendations -- Liquid Administration via Cup;Straw Medication Administration Crushed with puree Compensations Slow rate;Small sips/bites;Multiple dry swallows after each bite/sip Postural Changes Seated upright at 90 degrees;Remain semi-upright after after feeds/meals (Comment)     10/23/2021  10:38 AM Other Recommendations Recommended Consults Consider esophageal assessment Other Recommendations Have oral suction available Follow Up Recommendations Skilled nursing-short term rehab (<3 hours/day) Assistance recommended at discharge Frequent or constant Supervision/Assistance Functional Status Assessment Patient has had a recent decline in their functional status and demonstrates the ability to make significant improvements in function  in a reasonable and predictable amount of time.   10/23/2021  10:38 AM Frequency and Duration  Speech Therapy Frequency (ACUTE ONLY) min 2x/week Treatment Duration 2 weeks     10/23/2021  10:38 AM Oral Phase Oral Phase Impaired Oral - Thin Cup Premature spillage;Decreased bolus cohesion Oral - Thin Straw Premature spillage;Decreased bolus cohesion Oral - Puree WFL Oral - Regular WFL Oral - Pill Premature spillage;Decreased bolus cohesion    10/23/2021  10:38 AM Pharyngeal Phase Pharyngeal Phase Impaired Pharyngeal- Thin Cup Pharyngeal residue - valleculae;Pharyngeal residue - pyriform;Pharyngeal residue - posterior pharnyx;Reduced tongue base retraction;Reduced anterior laryngeal mobility;Penetration/Apiration after swallow;Trace aspiration Pharyngeal Material enters airway, remains ABOVE vocal cords and not ejected out;Material enters airway, passes BELOW cords and not ejected out despite cough attempt by patient Pharyngeal- Thin Straw Pharyngeal residue - valleculae;Pharyngeal residue - pyriform;Pharyngeal residue - posterior pharnyx;Reduced tongue base retraction;Reduced anterior laryngeal mobility Pharyngeal- Puree Pharyngeal residue - valleculae;Pharyngeal residue - pyriform;Pharyngeal residue - posterior pharnyx;Reduced tongue base retraction;Reduced anterior laryngeal mobility Pharyngeal- Regular Pharyngeal residue - valleculae;Pharyngeal residue - pyriform;Pharyngeal residue - posterior pharnyx;Reduced tongue base retraction;Reduced anterior laryngeal mobility Pharyngeal- Pill Pharyngeal residue - valleculae;Pharyngeal residue - pyriform;Pharyngeal residue - posterior pharnyx;Reduced tongue base retraction;Reduced anterior laryngeal mobility     No data to display    Shanika I. Hardin Negus, [place], [place] Office number [phone] Pager [phone] Horton Marshall 10/23/2021, 11:53 AM                          Scheduled Meds:  heparin  5,000 Units Subcutaneous Q8H   insulin  aspart  0-15 Units Subcutaneous TID WC   latanoprost  1 drop Left Eye QHS   pantoprazole (PROTONIX) IV  40 mg Intravenous Q12H   sodium chloride flush  3 mL Intravenous Q12H   tamsulosin  0.4 mg Oral PC supper   Continuous Infusions:  piperacillin-tazobactam (ZOSYN)  IV 3.375 g (10/24/21 0506)     LOS: 4 days    Time spent: 45 minutes spent on chart review, discussion with nursing staff, consultants, updating family and interview/physical exam; more than 50% of that time was spent  in counseling and/or coordination of care.    Geradine Girt, DO [facility] Available via Epic secure chat 7am-7pm After these hours, please refer to coverage provider listed on [email or web address] 10/24/2021, 12:46 PM

## 2021-10-25 DIAGNOSIS — J690 Pneumonitis due to inhalation of food and vomit: Secondary | ICD-10-CM | POA: Diagnosis not present

## 2021-10-25 LAB — GLUCOSE, CAPILLARY
Glucose-Capillary: 189 mg/dL — ABNORMAL HIGH (ref 70–99)
Glucose-Capillary: 151 mg/dL — ABNORMAL HIGH (ref 70–99)
Glucose-Capillary: 159 mg/dL — ABNORMAL HIGH (ref 70–99)
Glucose-Capillary: 122 mg/dL — ABNORMAL HIGH (ref 70–99)

## 2021-10-25 LAB — EXPECTORATED SPUTUM ASSESSMENT W GRAM STAIN, RFLX TO RESP C

## 2021-10-25 MED ORDER — ALBUTEROL SULFATE (2.5 MG/3ML) 0.083% IN NEBU
2.5000 mg | INHALATION_SOLUTION | RESPIRATORY_TRACT | Status: DC | PRN
Start: 2021-10-25 — End: 2021-10-27
  Administered 2021-10-25: 2.5 mg via RESPIRATORY_TRACT
  Filled 2021-10-25: qty 3

## 2021-10-25 NOTE — Progress Notes (Signed)
PROGRESS NOTE    Riley Griffith  QZE:[id number] DOB: March 04, 1924 DOA: 10/19/2021 PCP: [name], MD    Brief Narrative:  Riley Griffith is a 88 y.o. male with a history of IDT2DM, HTN, stage IIIb CKD, and recent admission (discharged 6/23) for sigmoid diverticulitis with abscess who presented to the ED 6/26 from SNF with encephalopathy, vomiting. In the ED he was febrile in respiratory distress requiring 15L by NRB, hypotensive to 69/50 with WBC 32.4k, reactive thrombocytosis, and lactic acidosis (level 3.0). CT abd/pelvis confirmed acute sigmoid diverticulitis with 2.8 x 2.2 cm abscess, air in bladder as well as bibasilar pulmonary consolidations and effusions. IV antibiotics given for aspiration pneumonia to also cover the abscess, IV fluids given, SLP evaluation requested, and the patient was admitted this morning by Dr. Myna Hidalgo. Respiratory status has stabilized. Family has confirmed the patient's reported desire to be DNR status and not to escalate care from the current level.  Family wants to transition to comfort on Monday when family around.    Assessment and Plan: Aspiration pneumonia; acute hypoxic respiratory failure; severe sepsis  - Discharged to SNF on 6/23 after inpatient conservative treatment of diverticulitis with abscess, now returning with feculent vomiting - CT abd/pelvis concerning for bilateral lower lobe consolidations  - He is febrile with increased leukocytosis, acute hypoxic respiratory failure, lactic acidosis, and acute encephalopathy  - Blood cultures were collected in ED, 3.5 liters IVF was given, and he was started on antibiotics  - palliative care consulted - SLP re-eval-- seems to cough with even the liquids (family does say he would not want feeding tube) -unable to get dg esophagus as not able to stand for 5 min and would be concerned about aspiration for him to do flat -nebs   Sigmoid diverticulitis with abscess  - WBC had trended down and symptoms improved  with treatment during recent admission; CT now demonstrates persistent inflammatory change and abscess though it appears smaller  - Suspect the primary problem is now aspiration PNA  - Continue Zosyn     AKI superimposed on CKD IIIb - SCr is 2.39 on admission, up from 1.80 on 10/17/21 and now back to baseline  - No hydronephrosis on CT in ED  - Likely acute prerenal injury, possibly ATN, in setting of sepsis, hypotension, N/V with hypovolemia  - He has been fluid-resuscitated in ED  - Avoid hypotension, renally-dose medications, serial chem panels    Hypertension  - Hypotensive in ED  - antihypertensives will be held      Insulin-dependent DM  - SSI     DVT prophylaxis: heparin injection 5,000 Units Start: 10/20/21 0630    Code Status: DNR   Disposition Plan:  Level of care: Med-Surg Status is: Inpatient Remains inpatient appropriate because: continued [place]  Son at bedside  Consultants:  Palliative care   Subjective: Having trouble breathing  Objective: Vitals:   10/24/21 1914 10/24/21 2309 10/25/21 0300 10/25/21 0733  BP: 126/69 128/72 110/63 135/72  Pulse: 92 88 90 96  Resp: '20 20 20 20  '$ Temp: 97.8 F (36.6 C) (!) 97.4 F (36.3 C) 98.1 F (36.7 C) 98 F (36.7 C)  TempSrc: Oral Oral Oral Oral  SpO2: 99% 99% 98% 97%    Intake/Output Summary (Last 24 hours) at 10/25/2021 1129 Last data filed at 10/24/2021 1700 Gross per 24 hour  Intake 216.32 ml  Output --  Net 216.32 ml   There were no vitals filed for this visit.  Examination:  General: Appearance:    elderly male in no acute distress     Lungs:     respirations mildly labored  Heart:    Normal heart rate. Normal rhythm. No murmurs, rubs, or gallops.   MS:   Below knee amputation of right lower extremity is noted.   Neurologic:   Awake, alert        Data Reviewed: I have personally reviewed following labs and imaging studies  CBC: Recent Labs  Lab 10/19/21 2313 10/21/21 0040  10/22/21 0606 10/23/21 0314  WBC 32.4* 32.8* 28.0* 22.2*  NEUTROABS 30.8*  --   --   --   HGB 11.7* 8.9* 9.6* 9.4*  HCT 34.9* 26.6* 29.4* 29.2*  MCV 95.4 95.7 96.1 97.0  PLT 476* 388 444* 625*   Basic Metabolic Panel: Recent Labs  Lab 10/19/21 2313 10/21/21 0040 10/22/21 0606 10/23/21 0314  NA 137 140 142 140  K 4.1 3.7 4.1 4.0  CL 104 110 110 111  CO2 19* 20* 22 23  GLUCOSE 236* 149* 237* 219*  BUN 40* 38* 35* 34*  CREATININE 2.39* 1.89* 1.77* 1.72*  CALCIUM 8.4* 8.0* 8.4* 8.3*  MG  --   --  1.8  --    GFR: CrCl cannot be calculated (Unknown ideal weight.). Liver Function Tests: Recent Labs  Lab 10/19/21 2313  AST 29  ALT 22  ALKPHOS 104  BILITOT 0.8  PROT 5.0*  ALBUMIN 2.3*   No results for input(s): "LIPASE", "AMYLASE" in the last 168 hours. No results for input(s): "AMMONIA" in the last 168 hours. Coagulation Profile: Recent Labs  Lab 10/19/21 2313  INR 1.5*   Cardiac Enzymes: No results for input(s): "CKTOTAL", "CKMB", "CKMBINDEX", "TROPONINI" in the last 168 hours. BNP (last 3 results) No results for input(s): "PROBNP" in the last 8760 hours. HbA1C: No results for input(s): "HGBA1C" in the last 72 hours.  CBG: Recent Labs  Lab 10/23/21 2107 10/24/21 0748 10/24/21 1150 10/24/21 1611 10/25/21 0915  GLUCAP 108* 145* 165* 128* 189*   Lipid Profile: No results for input(s): "CHOL", "HDL", "LDLCALC", "TRIG", "CHOLHDL", "LDLDIRECT" in the last 72 hours. Thyroid Function Tests: No results for input(s): "TSH", "T4TOTAL", "FREET4", "T3FREE", "THYROIDAB" in the last 72 hours. Anemia Panel: No results for input(s): "VITAMINB12", "FOLATE", "FERRITIN", "TIBC", "IRON", "RETICCTPCT" in the last 72 hours. Sepsis Labs: Recent Labs  Lab 10/19/21 2313 10/20/21 0135 10/20/21 0728  LATICACIDVEN 3.0* 2.7* 2.0*    Recent Results (from the past 240 hour(s))  Blood Culture (routine x 2)     Status: None   Collection Time: 10/19/21 11:13 PM   Specimen:  BLOOD RIGHT FOREARM  Result Value Ref Range Status   Specimen Description BLOOD RIGHT FOREARM  Final   Special Requests   Final    BOTTLES DRAWN AEROBIC AND ANAEROBIC Blood Culture adequate volume   Culture   Final    NO GROWTH 5 DAYS Performed at [facility], [address] [place], [place] [address]    Report Status 10/24/2021 FINAL  Final  Blood Culture (routine x 2)     Status: None   Collection Time: 10/19/21 11:34 PM   Specimen: BLOOD  Result Value Ref Range Status   Specimen Description BLOOD SITE NOT SPECIFIED  Final   Special Requests   Final    BOTTLES DRAWN AEROBIC AND ANAEROBIC Blood Culture adequate volume   Culture   Final    NO GROWTH 5 DAYS Performed at [facility], [address] [place], [place] [address]    Report Status 10/24/2021 FINAL  Final  Resp Panel by RT-PCR (Flu A&B, Covid) Anterior Nasal Swab     Status: None   Collection Time: 10/19/21 11:46 PM   Specimen: Anterior Nasal Swab  Result Value Ref Range Status   SARS Coronavirus 2 by RT PCR NEGATIVE NEGATIVE Final    Comment: (NOTE) SARS-CoV-2 target nucleic acids are NOT DETECTED.  The SARS-CoV-2 RNA is generally detectable in upper respiratory specimens during the acute phase of infection. The lowest concentration of SARS-CoV-2 viral copies this assay can detect is 138 copies/mL. A negative result does not preclude SARS-Cov-2 infection and should not be used as the sole basis for treatment or other patient management decisions. A negative result may occur with  improper specimen collection/handling, submission of specimen other than nasopharyngeal swab, presence of viral mutation(s) within the areas targeted by this assay, and inadequate number of viral copies(<138 copies/mL). A negative result must be combined with clinical observations, patient history, and epidemiological information. The expected result is Negative.  Fact Sheet for Patients:   EntrepreneurPulse.com.au  Fact Sheet for Healthcare Providers:  IncredibleEmployment.be  This test is no t yet approved or cleared by the [place] FDA and  has been authorized for detection and/or diagnosis of SARS-CoV-2 by FDA under an Emergency Use Authorization (EUA). This EUA will remain  in effect (meaning this test can be used) for the duration of the COVID-19 declaration under Section 564(b)(1) of the Act, 21 U.S.C.section 360bbb-3(b)(1), unless the authorization is terminated  or revoked sooner.       Influenza A by PCR NEGATIVE NEGATIVE Final   Influenza B by PCR NEGATIVE NEGATIVE Final    Comment: (NOTE) The Xpert Xpress SARS-CoV-2/FLU/RSV plus assay is intended as an aid in the diagnosis of influenza from Nasopharyngeal swab specimens and should not be used as a sole basis for treatment. Nasal washings and aspirates are unacceptable for Xpert Xpress SARS-CoV-2/FLU/RSV testing.  Fact Sheet for Patients: EntrepreneurPulse.com.au  Fact Sheet for Healthcare Providers: IncredibleEmployment.be  This test is not yet approved or cleared by the [place] FDA and has been authorized for detection and/or diagnosis of SARS-CoV-2 by FDA under an Emergency Use Authorization (EUA). This EUA will remain in effect (meaning this test can be used) for the duration of the COVID-19 declaration under Section 564(b)(1) of the Act, 21 U.S.C. section 360bbb-3(b)(1), unless the authorization is terminated or revoked.  Performed at [facility], [place] [address] [place], [place] [address]   Expectorated Sputum Assessment w Gram Stain, Rflx to Resp Cult     Status: None (Preliminary result)   Collection Time: 10/23/21  4:15 PM   Specimen: Sputum  Result Value Ref Range Status   Specimen Description SPU  Final   Special Requests NONE  Final   Sputum evaluation   Final    THIS SPECIMEN IS ACCEPTABLE FOR  SPUTUM CULTURE RARE GRAM POSITIVE COCCI IN CLUSTERS Performed at [facility], [place] [address], [place], [place] [address]    Report Status PENDING  Incomplete  Culture, Respiratory w Gram Stain     Status: None (Preliminary result)   Collection Time: 10/23/21  4:15 PM   Specimen: Sputum  Result Value Ref Range Status   Specimen Description SPU  Final   Special Requests NONE Reflexed from [id number]  Final   Gram Stain   Final    NO WBC SEEN RARE GRAM POSITIVE COCCI IN CLUSTERS  RARE YEAST WITH PSEUDOHYPHAE    Culture   Final    TOO YOUNG TO READ Performed at [facility], [place] [address] [place], [place] [address]    Report Status PENDING  Incomplete         Radiology Studies: No results found.      Scheduled Meds:  heparin  5,000 Units Subcutaneous Q8H   insulin aspart  0-15 Units Subcutaneous TID WC   latanoprost  1 drop Left Eye QHS   pantoprazole (PROTONIX) IV  40 mg Intravenous Q12H   sodium chloride flush  3 mL Intravenous Q12H   tamsulosin  0.4 mg Oral PC supper   Continuous Infusions:  piperacillin-tazobactam (ZOSYN)  IV 3.375 g (10/25/21 0517)     LOS: 5 days    Time spent: 45 minutes spent on chart review, discussion with nursing staff, consultants, updating family and interview/physical exam; more than 50% of that time was spent in counseling and/or coordination of care.    Geradine Girt, DO [facility] Available via Epic secure chat 7am-7pm After these hours, please refer to coverage provider listed on [email or web address] 10/25/2021, 11:29 AM

## 2021-10-26 DIAGNOSIS — J690 Pneumonitis due to inhalation of food and vomit: Secondary | ICD-10-CM | POA: Diagnosis not present

## 2021-10-26 DIAGNOSIS — N179 Acute kidney failure, unspecified: Secondary | ICD-10-CM | POA: Diagnosis not present

## 2021-10-26 LAB — GLUCOSE, CAPILLARY
Glucose-Capillary: 145 mg/dL — ABNORMAL HIGH (ref 70–99)
Glucose-Capillary: 151 mg/dL — ABNORMAL HIGH (ref 70–99)

## 2021-10-26 MED ORDER — MORPHINE SULFATE (PF) 2 MG/ML IV SOLN
1.0000 mg | INTRAVENOUS | Status: DC | PRN
  Administered 2021-10-26: 1 mg via INTRAVENOUS
  Filled 2021-10-26: qty 1

## 2021-10-26 MED ORDER — MORPHINE SULFATE (CONCENTRATE) 10 MG/0.5ML PO SOLN
5.0000 mg | ORAL | Status: DC | PRN
  Administered 2021-10-26 – 2021-10-27 (×2): 5 mg via SUBLINGUAL
  Filled 2021-10-26 (×2): qty 0.5

## 2021-10-26 MED ORDER — GLYCOPYRROLATE 0.2 MG/ML IJ SOLN
0.2000 mg | INTRAMUSCULAR | Status: DC | PRN
  Filled 2021-10-26 (×2): qty 1

## 2021-10-26 MED ORDER — MORPHINE SULFATE (CONCENTRATE) 10 MG/0.5ML PO SOLN: 5.0000 mg | ORAL | Status: DC | PRN

## 2021-10-26 MED ORDER — GLYCOPYRROLATE 1 MG PO TABS: 1.0000 mg | ORAL_TABLET | ORAL | Status: DC | PRN

## 2021-10-26 MED ORDER — MORPHINE SULFATE (PF) 2 MG/ML IV SOLN
1.0000 mg | INTRAVENOUS | Status: DC | PRN
  Administered 2021-10-26 (×4): 2 mg via INTRAVENOUS
  Filled 2021-10-26 (×4): qty 1

## 2021-10-26 MED ORDER — MORPHINE SULFATE (PF) 4 MG/ML IV SOLN
4.0000 mg | INTRAVENOUS | Status: DC | PRN
  Administered 2021-10-26 – 2021-10-27 (×3): 4 mg via INTRAVENOUS
  Filled 2021-10-26 (×4): qty 1

## 2021-10-26 MED ORDER — LORAZEPAM 2 MG/ML PO CONC: 1.0000 mg | ORAL | Status: DC | PRN

## 2021-10-26 MED ORDER — LORAZEPAM 2 MG/ML IJ SOLN
1.0000 mg | INTRAMUSCULAR | Status: DC | PRN
  Administered 2021-10-27: 1 mg via INTRAVENOUS
  Filled 2021-10-26: qty 1

## 2021-10-26 MED ORDER — GLYCOPYRROLATE 0.2 MG/ML IJ SOLN
0.2000 mg | INTRAMUSCULAR | Status: DC | PRN
  Administered 2021-10-26 – 2021-10-27 (×4): 0.2 mg via INTRAVENOUS
  Filled 2021-10-26 (×2): qty 1

## 2021-10-26 MED ORDER — LORAZEPAM 1 MG PO TABS
1.0000 mg | ORAL_TABLET | ORAL | Status: DC | PRN
  Administered 2021-10-26: 1 mg via ORAL
  Filled 2021-10-26: qty 1

## 2021-10-26 NOTE — Progress Notes (Addendum)
PROGRESS NOTE    Riley Griffith  YQM:[id number] DOB: July 29, 1923 DOA: 10/19/2021 PCP: [name], MD    Brief Narrative:  Riley Griffith is a 88 y.o. male with a history of IDT2DM, HTN, stage IIIb CKD, and recent admission (discharged 6/23) for sigmoid diverticulitis with abscess who presented to the ED 6/26 from SNF with encephalopathy, vomiting. In the ED he was febrile in respiratory distress requiring 15L by NRB, hypotensive to 69/50 with WBC 32.4k, reactive thrombocytosis, and lactic acidosis (level 3.0). CT abd/pelvis confirmed acute sigmoid diverticulitis with 2.8 x 2.2 cm abscess, air in bladder as well as bibasilar pulmonary consolidations and effusions. IV antibiotics given for aspiration pneumonia to also cover the abscess, IV fluids given, SLP evaluation requested, and the patient was admitted this morning by Dr. Myna Hidalgo. Respiratory status has stabilized. Family has confirmed the patient's reported desire to be DNR status and not to escalate care from the current level.  7/3- transitioned to comfort care.    Assessment and Plan: Aspiration pneumonia; acute hypoxic respiratory failure; severe sepsis  - Discharged to SNF on 6/23 after inpatient conservative treatment of diverticulitis with abscess, now returning with feculent vomiting - transitioned to comfort care after discussion at length with son at bedside-- they want him to bed comfortable   Sigmoid diverticulitis with abscess  - transition to comfort care  AKI superimposed on CKD IIIb - transition to comfort care   Hypertension   Insulin-dependent DM       DVT prophylaxis:     Code Status: DNR   Disposition Plan:  Level of care: Med-Surg Status is: Inpatient Remains inpatient appropriate because: continued [place]  Son at bedside  Consultants:  Palliative care   Subjective: Short of breath today  Objective: Vitals:   10/26/21 0300 10/26/21 0727 10/26/21 0935 10/26/21 1142  BP: 117/70 (!) 144/83  (!)  111/57  Pulse: 95 95  77  Resp: '20 18  20  '$ Temp: 98.1 F (36.7 C) 97.9 F (36.6 C)  97.9 F (36.6 C)  TempSrc: Oral Oral  Axillary  SpO2: 95% 97% 97% 97%    Intake/Output Summary (Last 24 hours) at 10/26/2021 1230 Last data filed at 10/26/2021 1100 Gross per 24 hour  Intake 373.61 ml  Output --  Net 373.61 ml   There were no vitals filed for this visit.  Examination:    General: Appearance:    Frail  male with mild increased work of breathing        Heart:    Normal heart rate.  MS:   Below knee amputation of right lower extremity is noted.   Neurologic:   Awake, alert          Data Reviewed: I have personally reviewed following labs and imaging studies  CBC: Recent Labs  Lab 10/19/21 2313 10/21/21 0040 10/22/21 0606 10/23/21 0314  WBC 32.4* 32.8* 28.0* 22.2*  NEUTROABS 30.8*  --   --   --   HGB 11.7* 8.9* 9.6* 9.4*  HCT 34.9* 26.6* 29.4* 29.2*  MCV 95.4 95.7 96.1 97.0  PLT 476* 388 444* 528*   Basic Metabolic Panel: Recent Labs  Lab 10/19/21 2313 10/21/21 0040 10/22/21 0606 10/23/21 0314  NA 137 140 142 140  K 4.1 3.7 4.1 4.0  CL 104 110 110 111  CO2 19* 20* 22 23  GLUCOSE 236* 149* 237* 219*  BUN 40* 38* 35* 34*  CREATININE 2.39* 1.89* 1.77* 1.72*  CALCIUM 8.4* 8.0* 8.4* 8.3*  MG  --   --  1.8  --    GFR: CrCl cannot be calculated (Unknown ideal weight.). Liver Function Tests: Recent Labs  Lab 10/19/21 2313  AST 29  ALT 22  ALKPHOS 104  BILITOT 0.8  PROT 5.0*  ALBUMIN 2.3*   No results for input(s): "LIPASE", "AMYLASE" in the last 168 hours. No results for input(s): "AMMONIA" in the last 168 hours. Coagulation Profile: Recent Labs  Lab 10/19/21 2313  INR 1.5*   Cardiac Enzymes: No results for input(s): "CKTOTAL", "CKMB", "CKMBINDEX", "TROPONINI" in the last 168 hours. BNP (last 3 results) No results for input(s): "PROBNP" in the last 8760 hours. HbA1C: No results for input(s): "HGBA1C" in the last 72 hours.  CBG: Recent  Labs  Lab 10/25/21 1242 10/25/21 1726 10/25/21 2308 10/26/21 0725 10/26/21 1141  GLUCAP 151* 159* 122* 145* 151*   Lipid Profile: No results for input(s): "CHOL", "HDL", "LDLCALC", "TRIG", "CHOLHDL", "LDLDIRECT" in the last 72 hours. Thyroid Function Tests: No results for input(s): "TSH", "T4TOTAL", "FREET4", "T3FREE", "THYROIDAB" in the last 72 hours. Anemia Panel: No results for input(s): "VITAMINB12", "FOLATE", "FERRITIN", "TIBC", "IRON", "RETICCTPCT" in the last 72 hours. Sepsis Labs: Recent Labs  Lab 10/19/21 2313 10/20/21 0135 10/20/21 0728  LATICACIDVEN 3.0* 2.7* 2.0*    Recent Results (from the past 240 hour(s))  Blood Culture (routine x 2)     Status: None   Collection Time: 10/19/21 11:13 PM   Specimen: BLOOD RIGHT FOREARM  Result Value Ref Range Status   Specimen Description BLOOD RIGHT FOREARM  Final   Special Requests   Final    BOTTLES DRAWN AEROBIC AND ANAEROBIC Blood Culture adequate volume   Culture   Final    NO GROWTH 5 DAYS Performed at [facility], [address], [place], [place] [address]    Report Status 10/24/2021 FINAL  Final  Blood Culture (routine x 2)     Status: None   Collection Time: 10/19/21 11:34 PM   Specimen: BLOOD  Result Value Ref Range Status   Specimen Description BLOOD SITE NOT SPECIFIED  Final   Special Requests   Final    BOTTLES DRAWN AEROBIC AND ANAEROBIC Blood Culture adequate volume   Culture   Final    NO GROWTH 5 DAYS Performed at [facility], [place] [address], [place], [place] [address]    Report Status 10/24/2021 FINAL  Final  Resp Panel by RT-PCR (Flu A&B, Covid) Anterior Nasal Swab     Status: None   Collection Time: 10/19/21 11:46 PM   Specimen: Anterior Nasal Swab  Result Value Ref Range Status   SARS Coronavirus 2 by RT PCR NEGATIVE NEGATIVE Final    Comment: (NOTE) SARS-CoV-2 target nucleic acids are NOT DETECTED.  The SARS-CoV-2 RNA is generally detectable in upper respiratory specimens  during the acute phase of infection. The lowest concentration of SARS-CoV-2 viral copies this assay can detect is 138 copies/mL. A negative result does not preclude SARS-Cov-2 infection and should not be used as the sole basis for treatment or other patient management decisions. A negative result may occur with  improper specimen collection/handling, submission of specimen other than nasopharyngeal swab, presence of viral mutation(s) within the areas targeted by this assay, and inadequate number of viral copies(<138 copies/mL). A negative result must be combined with clinical observations, patient history, and epidemiological information. The expected result is Negative.  Fact Sheet for Patients:  EntrepreneurPulse.com.au  Fact Sheet for Healthcare Providers:  IncredibleEmployment.be  This test is no t yet approved  or cleared by the [place] and  has been authorized for detection and/or diagnosis of SARS-CoV-2 by FDA under an Emergency Use Authorization (EUA). This EUA will remain  in effect (meaning this test can be used) for the duration of the COVID-19 declaration under Section 564(b)(1) of the Act, 21 U.S.C.section 360bbb-3(b)(1), unless the authorization is terminated  or revoked sooner.       Influenza A by PCR NEGATIVE NEGATIVE Final   Influenza B by PCR NEGATIVE NEGATIVE Final    Comment: (NOTE) The Xpert Xpress SARS-CoV-2/FLU/RSV plus assay is intended as an aid in the diagnosis of influenza from Nasopharyngeal swab specimens and should not be used as a sole basis for treatment. Nasal washings and aspirates are unacceptable for Xpert Xpress SARS-CoV-2/FLU/RSV testing.  Fact Sheet for Patients: EntrepreneurPulse.com.au  Fact Sheet for Healthcare Providers: IncredibleEmployment.be  This test is not yet approved or cleared by the [place] FDA and has been authorized for detection  and/or diagnosis of SARS-CoV-2 by FDA under an Emergency Use Authorization (EUA). This EUA will remain in effect (meaning this test can be used) for the duration of the COVID-19 declaration under Section 564(b)(1) of the Act, 21 U.S.C. section 360bbb-3(b)(1), unless the authorization is terminated or revoked.  Performed at [facility], [place] [address], [place], [place] [address]   Expectorated Sputum Assessment w Gram Stain, Rflx to Resp Cult     Status: None   Collection Time: 10/23/21  4:15 PM   Specimen: Sputum  Result Value Ref Range Status   Specimen Description SPU  Final   Special Requests NONE  Final   Sputum evaluation   Final    THIS SPECIMEN IS ACCEPTABLE FOR SPUTUM CULTURE Performed at [facility], [place] [address], [place], [place] [address]    Report Status 10/25/2021 FINAL  Final  Culture, Respiratory w Gram Stain     Status: None (Preliminary result)   Collection Time: 10/23/21  4:15 PM   Specimen: Sputum  Result Value Ref Range Status   Specimen Description SPU  Final   Special Requests NONE Reflexed from [id number]  Final   Gram Stain   Final    NO WBC SEEN RARE GRAM POSITIVE COCCI IN CLUSTERS RARE YEAST WITH PSEUDOHYPHAE Performed at [facility], [place] [address] [place], [place] [address]    Culture   Final    RARE STAPHYLOCOCCUS AUREUS RARE CANDIDA ALBICANS SUSCEPTIBILITIES TO FOLLOW STAPHYLOCOCCUS AUREUS    Report Status PENDING  Incomplete         Radiology Studies: No results found.      Scheduled Meds:  latanoprost  1 drop Left Eye QHS   pantoprazole (PROTONIX) IV  40 mg Intravenous Q12H   sodium chloride flush  3 mL Intravenous Q12H   tamsulosin  0.4 mg Oral PC supper   Continuous Infusions:     LOS: 6 days    Time spent: 45 minutes spent on chart review, discussion with nursing staff, consultants, updating family and interview/physical exam; more than 50% of that time was spent in counseling and/or coordination  of care.    Geradine Girt, DO [facility] Available via Epic secure chat 7am-7pm After these hours, please refer to coverage provider listed on [email or web address] 10/26/2021, 12:30 PM

## 2021-10-26 NOTE — Progress Notes (Signed)
Patient PN:Riley Griffith      DOB: 04/28/1923      RXV:[id number]      Palliative Medicine Team    Subjective: Bedside symptom check completed. No family or visitors present at time of visit.    Physical exam: Patient resting in bed with eyes closed at time of visit. Breathing even and non-labored with oxygen applied at 2L/min, no excessive secretions noted. Patient easily awakened with verbal calling of name, immediately with groaning, crying out, facial grimacing. Patient unable to maintain eye contact at this time. When asked if patient was in pain he did reply "yes" with eyes closed. This RN asked him to locate his pain, he was not able. Extremities cool to touch, radial pulse easily palpated. Heart rate regular at 86. No observable signs of nausea at this time, patient resting comfortably after morphine administration (see eMAR).   Assessment and plan: This RN reached out to the patient's bedside RN Geryl Rankins and touched base. Eli in agreement that patient rests soundly but wakes with observable signs of pain. This RN administered '2mg'$  morphine. This RN also removed oxygen and decreased alarm limits, as patient is full comfort care (Eli aware). At this time patient appropriate for transfer to residential hospice facility, nearing end of life. Will continue to follow for any changes or advances.    Thank you for allowing the Palliative Medicine Team to assist in the care of this patient.     Damian Leavell, MSN, RN Palliative Medicine Team Team Phone: [phone]  This phone is monitored 7a-7p, please reach out to attending physician outside of these hours for urgent needs.

## 2021-10-26 NOTE — TOC Progression Note (Signed)
Transition of Care [facility]) - Progression Note    Patient Details  Name: Riley Griffith MRN: [id number] Date of Birth: 1923/09/17  Transition of Care [facility] CM/SW [place], RN Phone Number:[phone]  10/26/2021, 1:56 PM  Clinical Narrative:    CM received message from MD for referral for [facility]. Referral has been sent to [place]. Cm sent message to [place], Referral has already been received. TOC will continue to follow.         Expected Discharge Plan and Services                                                 Social Determinants of Health (SDOH) Interventions    Readmission Risk Interventions    10/16/2021    3:16 PM 10/15/2021    1:19 PM  Readmission Risk Prevention Plan  Post Dischage Appt Not Complete   Medication Screening Complete Complete  Transportation Screening Complete Complete

## 2021-10-26 NOTE — Progress Notes (Addendum)
Manufacturing engineer [facility]) Hospital Liaison Note  Received request from Transitions of Care Manager Festus Holts B. for family interest in [facility]. Visited patient at bedside and spoke with son/Larry to confirm interest and explain services.  Approval for [employer] is determined by [facility] MD. Once [facility] MD has determined [facility] eligibility, [place] will update hospital staff and family. Patient approved and bed available. However, family has requested to hold off on any transfer/consent completion until tomorrow after additional family can visit patient in [facility].   Please do not hesitate to call with any hospice related questions.    Thank you for the opportunity to participate in this patient's care.  Daphene Calamity, MSW [facility] Liaison  [phone]

## 2021-10-27 DIAGNOSIS — J690 Pneumonitis due to inhalation of food and vomit: Secondary | ICD-10-CM | POA: Diagnosis not present

## 2021-10-27 LAB — CULTURE, RESPIRATORY W GRAM STAIN: Gram Stain: NONE SEEN

## 2021-10-27 MED ORDER — GLYCOPYRROLATE 0.2 MG/ML IJ SOLN
0.2000 mg | INTRAMUSCULAR | Status: DC | PRN
  Administered 2021-10-27: 0.2 mg via SUBCUTANEOUS

## 2021-10-27 MED ORDER — GLYCOPYRROLATE 1 MG PO TABS
1.0000 mg | ORAL_TABLET | ORAL | Status: DC | PRN
Start: 2021-10-27 — End: 2021-10-27

## 2021-10-27 MED ORDER — GLYCOPYRROLATE 0.2 MG/ML IJ SOLN: 1.0000 mg | Freq: Four times a day (QID) | INTRAMUSCULAR | Status: AC

## 2021-10-27 MED ORDER — MORPHINE SULFATE (PF) 4 MG/ML IV SOLN: 4.0000 mg | INTRAVENOUS | 0 refills | Status: AC | PRN

## 2021-10-27 MED ORDER — GLYCOPYRROLATE 0.2 MG/ML IJ SOLN
1.0000 mg | Freq: Four times a day (QID) | INTRAMUSCULAR | Status: DC
Start: 2021-10-27 — End: 2021-10-27
  Administered 2021-10-27: 1 mg via INTRAVENOUS
  Filled 2021-10-27 (×4): qty 5

## 2021-10-27 MED ORDER — ACETAMINOPHEN 650 MG RE SUPP: 650.0000 mg | Freq: Four times a day (QID) | RECTAL | 0 refills | Status: AC | PRN

## 2021-10-27 MED ORDER — LORAZEPAM 2 MG/ML IJ SOLN: 1.0000 mg | INTRAMUSCULAR | 0 refills | Status: AC | PRN

## 2021-10-27 MED ORDER — GLYCOPYRROLATE 0.2 MG/ML IJ SOLN
0.2000 mg | INTRAMUSCULAR | Status: DC | PRN
Start: 2021-10-27 — End: 2021-10-27
  Filled 2021-10-27: qty 1

## 2021-10-27 NOTE — Discharge Summary (Addendum)
Physician Discharge Summary  Riley Griffith PPJ:[id number] DOB: 29-Mar-1924 DOA: 10/19/2021  PCP: [name], MD  Admit date: 10/19/2021 Discharge date: 10/27/2021  Admitted From: SNF Discharge disposition: hospice   Recommendations for Outpatient Follow-Up:   Residential hospice- currently stable for transition to facility   Discharge Diagnosis:   Principal Problem:   Aspiration pneumonia ([place]) Active Problems:   Insulin dependent type 2 diabetes mellitus ([place])   HTN (hypertension)   Abscess of sigmoid colon due to diverticulitis   Acute renal failure superimposed on stage 3b chronic kidney disease (HCC)   Acute respiratory failure with hypoxia (HCC)   Severe sepsis (HCC)      History of Present Illness:   Riley Griffith is a pleasant 88 y.o. male with medical history significant for insulin-dependent diabetes mellitus, hypertension, CKD stage IIIb, and recent admission for sigmoid diverticulitis with abscess, now returning to the emergency department from his SNF with recurrent bouts of nausea and vomiting.  Patient is disoriented and unable to contribute much to the history.  His son was reached by phone and notes that patient's health has been declining rapidly over the past month, he has had dry heaves frequently while trying to swallow, actually seem to be doing a bit better 2 days ago, but then yesterday was more confused than usual.  SNF then noted recurrent episodes of vomiting feculent material.   Hospital Course by Problem:   Aspiration pneumonia; acute hypoxic respiratory failure; severe sepsis  - Discharged to SNF on 6/23 after inpatient conservative treatment of diverticulitis with abscess, now returning with feculent vomiting - transitioned to comfort care after discussion at length with son at bedside-- they want him to bed comfortable D/c to residential hospice   Sigmoid diverticulitis with abscess  - transition to comfort care   AKI superimposed  on CKD IIIb - transition to comfort care   Hypertension   Insulin-dependent DM     Medical Consultants:   Palla[facility] care   Discharge Exam:   Vitals:   10/26/21 2252 10/27/21 0737  BP: (!) 70/45 (!) 74/59  Pulse:    Resp:    Temp: 98.7 F (37.1 C) 98.1 F (36.7 C)  SpO2:  (!) 88%   Vitals:   10/26/21 1142 10/26/21 2200 10/26/21 2252 10/27/21 0737  BP: (!) 111/57  (!) 70/45 (!) 74/59  Pulse: 77     Resp: 20     Temp: 97.9 F (36.6 C) 98 F (36.7 C) 98.7 F (37.1 C) 98.1 F (36.7 C)  TempSrc: Axillary  Axillary Axillary  SpO2: 97%   (!) 88%    General exam: Appears calm and comfortable. Has secretions  The results of significant diagnostics from this hospitalization (including imaging, microbiology, ancillary and laboratory) are listed below for reference.     Procedures and Diagnostic Studies:   CT ABDOMEN PELVIS WO CONTRAST  Result Date: 10/20/2021 CLINICAL DATA:  Brown emesis. EXAM: CT ABDOMEN AND PELVIS WITHOUT CONTRAST TECHNIQUE: Multidetector CT imaging of the abdomen and pelvis was performed following the standard protocol without IV contrast. RADIATION DOSE REDUCTION: This exam was performed according to the departmental dose-optimization program which includes automated exposure control, adjustment of the mA and/or kV according to patient size and/or use of iterative reconstruction technique. COMPARISON:  October 11, 2021 and November 15, 2018 FINDINGS: Lower chest: Moderate to marked severity bilateral lower lobe consolidation is seen. This is increased in severity when compared to the prior study. There is a  moderate sized right-sided pleural effusion. A small left pleural effusion is also noted. These are both increased in size when compared to the prior exam. Hepatobiliary: No focal liver abnormality is seen. Adjacent 4 mm gallstones are seen within the lumen of a contracted gallbladder. There is no evidence of biliary dilatation. Pancreas: A stable 2.2 cm x 2.4 cm  x 2.3 cm low-attenuation lesion is seen along the junction of the body and tail of the pancreas (approximately -6.61 Hounsfield units/axial CT image 30, CT series 3). No peripancreatic inflammatory fat stranding or pancreatic ductal dilatation is identified. Spleen: Normal in size without focal abnormality. Adrenals/Urinary Tract: Adrenal glands are unremarkable. Kidneys are mildly atrophic, without renal calculi, focal lesion, or hydronephrosis. A small amount of air is seen within the anterior aspect the urinary bladder. This represents a new finding. Stomach/Bowel: There is a small to moderate-sized hiatal hernia. The appendix is not identified. There is no evidence of bowel dilatation. Moderately inflamed diverticula are seen within the proximal sigmoid colon. Associated pericolonic inflammatory fat stranding is noted. A 2.8 cm x 2.2 cm pericolonic abscess is again seen within the left pelvis (axial CT images 56 through 62, CT series 3). This area measured 3.1 cm x 2.8 cm on the prior study. Vascular/Lymphatic: Aortic atherosclerosis. No enlarged abdominal or pelvic lymph nodes. Reproductive: The prostate gland is moderately enlarged. Other: A 3.9 cm x 2.3 cm right inguinal hernia is seen. This contains fat and a small amount of fluid. A 3.4 cm x 2.2 cm fat containing left inguinal hernia is also noted. There is a moderate amount of abdominopelvic free fluid. Musculoskeletal: Multilevel degenerative changes are seen throughout the lumbar spine. IMPRESSION: 1. Acute sigmoid diverticulitis with an adjacent 2.8 cm x 2.2 cm pericolonic abscess. 2. Moderate to marked severity bilateral lower lobe consolidation, increased in severity when compared to the prior study. 3. Bilateral pleural effusions, increased in size when compared to the prior study. 4. Cholelithiasis. 5. Small to moderate-sized hiatal hernia. 6. Bilateral fat containing inguinal hernias. 7. Enlarged prostate gland. 8. Small amount of air within the  anterior aspect of the urinary bladder. This may represent sequelae associated with recent Foley catheterization, however, further evaluation with urinalysis is recommended to exclude cystitis. 9. Stable low-attenuation pancreatic lesion, as described above. Given the patient's age (greater than 72 years of age), no additional follow-up or imaging is recommended. This recommendation follows ACR consensus guidelines: Management of incidental Pancreatic Cysts: A White Paper of the ACR Incidental Findings Committee. [place] 5631;[id number]. 10. Aortic atherosclerosis. Aortic Atherosclerosis (ICD10-I70.0). Electronically Signed   By: Virgina Norfolk M.D.   On: 10/20/2021 02:18   DG Abd Portable 2V  Result Date: 10/19/2021 CLINICAL DATA:  Abdominal pain. EXAM: PORTABLE ABDOMEN - 2 VIEW COMPARISON:  None Available. FINDINGS: Mild to moderate severity atelectasis and/or infiltrate is seen within the left lung base. The bowel gas pattern is normal. There is no evidence of free air. No radio-opaque calculi are seen. Calcified vessels are noted within the left upper quadrant. IMPRESSION: 1. Mild to moderate severity left basilar atelectasis and/or infiltrate. 2. Normal bowel gas pattern. Electronically Signed   By: Virgina Norfolk M.D.   On: 10/19/2021 23:47   DG Chest Port 1 View  Result Date: 10/19/2021 CLINICAL DATA:  Questionable sepsis. EXAM: PORTABLE CHEST 1 VIEW COMPARISON:  November 15, 2018 FINDINGS: Multiple radiopaque cardiac lead wires are seen overlying the right lung. The heart size and mediastinal contours are within normal limits.  There is marked severity calcification of the aortic arch. Mild to moderate severity areas of hazy airspace disease are seen overlying the mid right lung and bilateral lung bases. There is no evidence of a pleural effusion or pneumothorax. Multilevel degenerative changes are seen throughout the thoracic spine. IMPRESSION: Mild to moderate severity airspace disease  within mid right lung and bilateral lung bases. Electronically Signed   By: Virgina Norfolk M.D.   On: 10/19/2021 23:26     Labs:   Basic Metabolic Panel: Recent Labs  Lab 10/21/21 0040 10/22/21 0606 10/23/21 0314  NA 140 142 140  K 3.7 4.1 4.0  CL 110 110 111  CO2 20* 22 23  GLUCOSE 149* 237* 219*  BUN 38* 35* 34*  CREATININE 1.89* 1.77* 1.72*  CALCIUM 8.0* 8.4* 8.3*  MG  --  1.8  --    GFR CrCl cannot be calculated (Unknown ideal weight.). Liver Function Tests: No results for input(s): "AST", "ALT", "ALKPHOS", "BILITOT", "PROT", "ALBUMIN" in the last 168 hours. No results for input(s): "LIPASE", "AMYLASE" in the last 168 hours. No results for input(s): "AMMONIA" in the last 168 hours. Coagulation profile No results for input(s): "INR", "PROTIME" in the last 168 hours.  CBC: Recent Labs  Lab 10/21/21 0040 10/22/21 0606 10/23/21 0314  WBC 32.8* 28.0* 22.2*  HGB 8.9* 9.6* 9.4*  HCT 26.6* 29.4* 29.2*  MCV 95.7 96.1 97.0  PLT 388 444* 460*   Cardiac Enzymes: No results for input(s): "CKTOTAL", "CKMB", "CKMBINDEX", "TROPONINI" in the last 168 hours. BNP: Invalid input(s): "POCBNP" CBG: Recent Labs  Lab 10/25/21 1242 10/25/21 1726 10/25/21 2308 10/26/21 0725 10/26/21 1141  GLUCAP 151* 159* 122* 145* 151*   D-Dimer No results for input(s): "DDIMER" in the last 72 hours. Hgb A1c No results for input(s): "HGBA1C" in the last 72 hours. Lipid Profile No results for input(s): "CHOL", "HDL", "LDLCALC", "TRIG", "CHOLHDL", "LDLDIRECT" in the last 72 hours. Thyroid function studies No results for input(s): "TSH", "T4TOTAL", "T3FREE", "THYROIDAB" in the last 72 hours.  Invalid input(s): "FREET3" Anemia work up No results for input(s): "VITAMINB12", "FOLATE", "FERRITIN", "TIBC", "IRON", "RETICCTPCT" in the last 72 hours. Microbiology Recent Results (from the past 240 hour(s))  Blood Culture (routine x 2)     Status: None   Collection Time: 10/19/21 11:13 PM    Specimen: BLOOD RIGHT FOREARM  Result Value Ref Range Status   Specimen Description BLOOD RIGHT FOREARM  Final   Special Requests   Final    BOTTLES DRAWN AEROBIC AND ANAEROBIC Blood Culture adequate volume   Culture   Final    NO GROWTH 5 DAYS Performed at [facility], [address], [place], [place] [address]    Report Status 10/24/2021 FINAL  Final  Blood Culture (routine x 2)     Status: None   Collection Time: 10/19/21 11:34 PM   Specimen: BLOOD  Result Value Ref Range Status   Specimen Description BLOOD SITE NOT SPECIFIED  Final   Special Requests   Final    BOTTLES DRAWN AEROBIC AND ANAEROBIC Blood Culture adequate volume   Culture   Final    NO GROWTH 5 DAYS Performed at [facility], [place] [address], [place], [place] [address]    Report Status 10/24/2021 FINAL  Final  Resp Panel by RT-PCR (Flu A&B, Covid) Anterior Nasal Swab     Status: None   Collection Time: 10/19/21 11:46 PM   Specimen: Anterior Nasal Swab  Result Value Ref Range  Status   SARS Coronavirus 2 by RT PCR NEGATIVE NEGATIVE Final    Comment: (NOTE) SARS-CoV-2 target nucleic acids are NOT DETECTED.  The SARS-CoV-2 RNA is generally detectable in upper respiratory specimens during the acute phase of infection. The lowest concentration of SARS-CoV-2 viral copies this assay can detect is 138 copies/mL. A negative result does not preclude SARS-Cov-2 infection and should not be used as the sole basis for treatment or other patient management decisions. A negative result may occur with  improper specimen collection/handling, submission of specimen other than nasopharyngeal swab, presence of viral mutation(s) within the areas targeted by this assay, and inadequate number of viral copies(<138 copies/mL). A negative result must be combined with clinical observations, patient history, and epidemiological information. The expected result is Negative.  Fact Sheet for Patients:   EntrepreneurPulse.com.au  Fact Sheet for Healthcare Providers:  IncredibleEmployment.be  This test is no t yet approved or cleared by the [place] FDA and  has been authorized for detection and/or diagnosis of SARS-CoV-2 by FDA under an Emergency Use Authorization (EUA). This EUA will remain  in effect (meaning this test can be used) for the duration of the COVID-19 declaration under Section 564(b)(1) of the Act, 21 U.S.C.section 360bbb-3(b)(1), unless the authorization is terminated  or revoked sooner.       Influenza A by PCR NEGATIVE NEGATIVE Final   Influenza B by PCR NEGATIVE NEGATIVE Final    Comment: (NOTE) The Xpert Xpress SARS-CoV-2/FLU/RSV plus assay is intended as an aid in the diagnosis of influenza from Nasopharyngeal swab specimens and should not be used as a sole basis for treatment. Nasal washings and aspirates are unacceptable for Xpert Xpress SARS-CoV-2/FLU/RSV testing.  Fact Sheet for Patients: EntrepreneurPulse.com.au  Fact Sheet for Healthcare Providers: IncredibleEmployment.be  This test is not yet approved or cleared by the [place] FDA and has been authorized for detection and/or diagnosis of SARS-CoV-2 by FDA under an Emergency Use Authorization (EUA). This EUA will remain in effect (meaning this test can be used) for the duration of the COVID-19 declaration under Section 564(b)(1) of the Act, 21 U.S.C. section 360bbb-3(b)(1), unless the authorization is terminated or revoked.  Performed at [facility], [place] [address] [place], [place] [address]   Expectorated Sputum Assessment w Gram Stain, Rflx to Resp Cult     Status: None   Collection Time: 10/23/21  4:15 PM   Specimen: Sputum  Result Value Ref Range Status   Specimen Description SPU  Final   Special Requests NONE  Final   Sputum evaluation   Final    THIS SPECIMEN IS ACCEPTABLE FOR SPUTUM  CULTURE Performed at [facility], [place] [address], [place], [place] [address]    Report Status 10/25/2021 FINAL  Final  Culture, Respiratory w Gram Stain     Status: None   Collection Time: 10/23/21  4:15 PM   Specimen: Sputum  Result Value Ref Range Status   Specimen Description SPU  Final   Special Requests NONE Reflexed from [id number]  Final   Gram Stain   Final    NO WBC SEEN RARE GRAM POSITIVE COCCI IN CLUSTERS RARE YEAST WITH PSEUDOHYPHAE Performed at [facility], [place] [address], [place], [place] [address]    Culture   Final    RARE STAPHYLOCOCCUS AUREUS RARE CANDIDA ALBICANS    Report Status 10/27/2021 FINAL  Final   Organism ID, Bacteria STAPHYLOCOCCUS AUREUS  Final      Susceptibility  Staphylococcus aureus - MIC*    CIPROFLOXACIN >=8 RESISTANT Resistant     ERYTHROMYCIN >=8 RESISTANT Resistant     GENTAMICIN <=0.5 SENSITIVE Sensitive     OXACILLIN >=4 RESISTANT Resistant     TETRACYCLINE <=1 SENSITIVE Sensitive     VANCOMYCIN 1 SENSITIVE Sensitive     TRIMETH/SULFA <=10 SENSITIVE Sensitive     CLINDAMYCIN <=0.25 SENSITIVE Sensitive     RIFAMPIN <=0.5 SENSITIVE Sensitive     Inducible Clindamycin NEGATIVE Sensitive     * RARE STAPHYLOCOCCUS AUREUS     Discharge Instructions:   Discharge Instructions     No wound care   Complete by: As directed       Allergies as of 10/27/2021   No Known Allergies      Medication List     STOP taking these medications    acetaminophen 325 MG tablet Commonly known as: TYLENOL Replaced by: acetaminophen 650 MG suppository   acidophilus Caps capsule   ACIDOPHILUS/PECTIN PO   amLODipine 5 MG tablet Commonly known as: NORVASC   calcitRIOL 0.25 MCG capsule Commonly known as: ROCALTROL   cholecalciferol 1000 units tablet Commonly known as: VITAMIN D   ciprofloxacin 500 MG tablet Commonly known as: CIPRO   furosemide 20 MG tablet Commonly known as: LASIX   Gerhardt's butt cream Crea   insulin  aspart 100 UNIT/ML injection Commonly known as: novoLOG   insulin glargine 100 UNIT/ML injection Commonly known as: LANTUS   metroNIDAZOLE 500 MG tablet Commonly known as: FLAGYL   NUTRITIONAL SUPPLEMENT PO   sodium bicarbonate 650 MG tablet   tamsulosin 0.4 MG Caps capsule Commonly known as: FLOMAX       TAKE these medications    acetaminophen 650 MG suppository Commonly known as: TYLENOL Place 1 suppository (650 mg total) rectally every 6 (six) hours as needed for mild pain (or Fever >/= 101). Replaces: acetaminophen 325 MG tablet   glycopyrrolate 0.2 MG/ML injection Commonly known as: ROBINUL Inject 5 mLs (1 mg total) into the vein 4 (four) times daily.   LORazepam 2 MG/ML injection Commonly known as: ATIVAN Inject 0.5 mLs (1 mg total) into the vein every 4 (four) hours as needed for anxiety.   [place]umigan 0.01 % Soln Generic drug: bimatoprost USE 1 DROP TO LEFT EYE AT BEDTIME. What changed:  how much to take how to take this when to take this additional instructions   morphine (PF) 4 MG/ML injection Inject 1 mL (4 mg total) into the vein every hour as needed for moderate pain or severe pain.          Time coordinating discharge: 45 min  Signed:  Geradine Girt DO  [facility] 10/27/2021, 11:08 AM

## 2021-10-27 NOTE — TOC Transition Note (Signed)
Transition of Care [facility]) - CM/SW Discharge Note   Patient Details  Name: Riley Griffith MRN: [id number] Date of Birth: 1923-10-15  [facility] CM/SW Contact:  Vinie Sill, LCSW Phone Number: 10/27/2021, 11:57 AM   Clinical Narrative:      Patient will Discharge to: [facility]  Discharge Date: 10/27/2021 Family Notified: RN informed family Transport [id number]  Per MD patient is ready for discharge. RN, patient, and facility notified of discharge. Discharge Summary sent to facility. RN given number for report [phone]. Ambulance transport requested for patient arranged by Hospice.  Clinical Social Worker signing off.   Thurmond Butts, MSW, LCSW Clinical Social Worker       Patient Goals and CMS Choice        Discharge Placement                       Discharge Plan and Services                                     Social Determinants of Health (SDOH) Interventions     Readmission Risk Interventions    10/16/2021    3:16 PM 10/15/2021    1:19 PM  Readmission Risk Prevention Plan  Post Dischage Appt Not Complete   Medication Screening Complete Complete  Transportation Screening Complete Complete

## 2021-10-27 NOTE — Progress Notes (Signed)
I responded to a page from the nurse to provide spiritual support for the patient and his family. I arrived at the patient's room where his daughters and son-laws were present. I provided spiritual care through pastoral presence, reading scripture, and by leading in prayer.    10/27/21 1137  Clinical Encounter Type  Visited With Patient and family together  Visit Type Initial;Spiritual support  Referral From Nurse  Consult/Referral To Chaplain  Spiritual Encounters  Spiritual Needs Sacred text;Prayer     Chaplain Dr Redgie Grayer

## 2021-10-27 NOTE — Progress Notes (Signed)
[facility])   Consent forms have been completed.  PTAR notified of patient D/C and transport arranged. TOC/Brenda and Attending Physician/Dr. Eliseo Squires also notified of transport arrangement.    Please send signed DNR form with patient and RN call report to [phone].    Daphene Calamity, MSW [facility] Liaison [phone]

## 2021-10-27 NOTE — Progress Notes (Signed)
Patient picked up by ems, family at bedside aware of the transfer. Pain meds given before transfer.

## 2021-10-27 NOTE — Progress Notes (Addendum)
Report called to nurse at [facility], family at bedside notified of the transfer and agreeable of the plan. No ss of pain noted. PIV left in place per receiving facility.

## 2021-11-24 DEATH — deceased

## 2022-02-01 ENCOUNTER — Encounter (INDEPENDENT_AMBULATORY_CARE_PROVIDER_SITE_OTHER): Payer: HMO | Admitting: Ophthalmology
